# Patient Record
Sex: Female | Born: 1965 | Race: White | Hispanic: No | Marital: Married | Smoking: Never smoker
Health system: Southern US, Community
[De-identification: ages and names within clinical notes are randomized; demographics above are authoritative.]

## PROBLEM LIST (undated history)

## (undated) DIAGNOSIS — F419 Anxiety disorder, unspecified: Secondary | ICD-10-CM

## (undated) DIAGNOSIS — R45851 Suicidal ideations: Secondary | ICD-10-CM

## (undated) DIAGNOSIS — K8020 Calculus of gallbladder without cholecystitis without obstruction: Secondary | ICD-10-CM

## (undated) DIAGNOSIS — F32A Depression, unspecified: Secondary | ICD-10-CM

## (undated) DIAGNOSIS — K649 Unspecified hemorrhoids: Secondary | ICD-10-CM

## (undated) DIAGNOSIS — F329 Major depressive disorder, single episode, unspecified: Secondary | ICD-10-CM

## (undated) DIAGNOSIS — G4700 Insomnia, unspecified: Secondary | ICD-10-CM

## (undated) DIAGNOSIS — F429 Obsessive-compulsive disorder, unspecified: Secondary | ICD-10-CM

## (undated) DIAGNOSIS — K5909 Other constipation: Secondary | ICD-10-CM

## (undated) DIAGNOSIS — I10 Essential (primary) hypertension: Secondary | ICD-10-CM

## (undated) DIAGNOSIS — G43019 Migraine without aura, intractable, without status migrainosus: Secondary | ICD-10-CM

## (undated) DIAGNOSIS — R51 Headache: Secondary | ICD-10-CM

## (undated) DIAGNOSIS — G40909 Epilepsy, unspecified, not intractable, without status epilepticus: Secondary | ICD-10-CM

## (undated) DIAGNOSIS — M1990 Unspecified osteoarthritis, unspecified site: Secondary | ICD-10-CM

## (undated) HISTORY — DX: Insomnia, unspecified: G47.00

## (undated) HISTORY — PX: TONSILLECTOMY: SUR1361

## (undated) HISTORY — DX: Calculus of gallbladder without cholecystitis without obstruction: K80.20

## (undated) HISTORY — PX: CHOLECYSTECTOMY: SHX55

## (undated) HISTORY — DX: Epilepsy, unspecified, not intractable, without status epilepticus: G40.909

## (undated) HISTORY — DX: Depression, unspecified: F32.A

## (undated) HISTORY — DX: Other constipation: K59.09

## (undated) HISTORY — DX: Headache: R51

## (undated) HISTORY — DX: Suicidal ideations: R45.851

## (undated) HISTORY — DX: Unspecified osteoarthritis, unspecified site: M19.90

## (undated) HISTORY — DX: Migraine without aura, intractable, without status migrainosus: G43.019

## (undated) HISTORY — DX: Essential (primary) hypertension: I10

## (undated) HISTORY — DX: Unspecified hemorrhoids: K64.9

## (undated) HISTORY — DX: Obsessive-compulsive disorder, unspecified: F42.9

## (undated) HISTORY — PX: BREAST EXCISIONAL BIOPSY: SUR124

## (undated) HISTORY — DX: Anxiety disorder, unspecified: F41.9

## (undated) HISTORY — DX: Major depressive disorder, single episode, unspecified: F32.9

## (undated) HISTORY — PX: APPENDECTOMY: SHX54

---

## 1998-11-02 ENCOUNTER — Other Ambulatory Visit: Admission: RE | Admit: 1998-11-02 | Discharge: 1998-11-02 | Payer: Self-pay | Admitting: *Deleted

## 1998-12-21 ENCOUNTER — Ambulatory Visit (HOSPITAL_COMMUNITY): Admission: RE | Admit: 1998-12-21 | Discharge: 1998-12-21 | Payer: Self-pay | Admitting: *Deleted

## 1999-11-03 ENCOUNTER — Other Ambulatory Visit: Admission: RE | Admit: 1999-11-03 | Discharge: 1999-11-03 | Payer: Self-pay | Admitting: *Deleted

## 1999-12-06 HISTORY — PX: ABDOMINAL HYSTERECTOMY: SHX81

## 2000-03-31 ENCOUNTER — Encounter: Admission: RE | Admit: 2000-03-31 | Discharge: 2000-03-31 | Payer: Self-pay | Admitting: Urology

## 2000-03-31 ENCOUNTER — Encounter: Payer: Self-pay | Admitting: Urology

## 2000-06-02 ENCOUNTER — Encounter (INDEPENDENT_AMBULATORY_CARE_PROVIDER_SITE_OTHER): Payer: Self-pay

## 2000-06-02 ENCOUNTER — Inpatient Hospital Stay (HOSPITAL_COMMUNITY): Admission: RE | Admit: 2000-06-02 | Discharge: 2000-06-04 | Payer: Self-pay | Admitting: *Deleted

## 2002-05-13 ENCOUNTER — Other Ambulatory Visit: Admission: RE | Admit: 2002-05-13 | Discharge: 2002-05-13 | Payer: Self-pay | Admitting: *Deleted

## 2003-05-15 ENCOUNTER — Other Ambulatory Visit: Admission: RE | Admit: 2003-05-15 | Discharge: 2003-05-15 | Payer: Self-pay | Admitting: *Deleted

## 2004-01-06 ENCOUNTER — Encounter: Admission: RE | Admit: 2004-01-06 | Discharge: 2004-01-06 | Payer: Self-pay | Admitting: Family Medicine

## 2004-06-01 ENCOUNTER — Encounter: Admission: RE | Admit: 2004-06-01 | Discharge: 2004-06-01 | Payer: Self-pay | Admitting: Internal Medicine

## 2004-06-10 ENCOUNTER — Encounter: Admission: RE | Admit: 2004-06-10 | Discharge: 2004-06-10 | Payer: Self-pay | Admitting: Internal Medicine

## 2004-06-25 ENCOUNTER — Encounter: Admission: RE | Admit: 2004-06-25 | Discharge: 2004-06-25 | Payer: Self-pay | Admitting: Internal Medicine

## 2004-07-09 ENCOUNTER — Encounter: Admission: RE | Admit: 2004-07-09 | Discharge: 2004-07-09 | Payer: Self-pay | Admitting: Internal Medicine

## 2004-07-13 ENCOUNTER — Inpatient Hospital Stay (HOSPITAL_COMMUNITY): Admission: EM | Admit: 2004-07-13 | Discharge: 2004-07-15 | Payer: Self-pay | Admitting: *Deleted

## 2004-07-30 ENCOUNTER — Encounter: Admission: RE | Admit: 2004-07-30 | Discharge: 2004-07-30 | Payer: Self-pay | Admitting: Internal Medicine

## 2004-10-13 ENCOUNTER — Ambulatory Visit: Payer: Self-pay | Admitting: Internal Medicine

## 2004-11-03 ENCOUNTER — Ambulatory Visit: Payer: Self-pay | Admitting: Internal Medicine

## 2004-12-13 ENCOUNTER — Ambulatory Visit: Payer: Self-pay | Admitting: Internal Medicine

## 2005-02-09 ENCOUNTER — Ambulatory Visit: Payer: Self-pay | Admitting: Family Medicine

## 2005-05-14 ENCOUNTER — Ambulatory Visit: Payer: Self-pay | Admitting: Family Medicine

## 2005-06-29 ENCOUNTER — Ambulatory Visit: Payer: Self-pay | Admitting: Family Medicine

## 2006-08-23 ENCOUNTER — Ambulatory Visit: Payer: Self-pay | Admitting: Family Medicine

## 2006-08-25 ENCOUNTER — Ambulatory Visit: Payer: Self-pay | Admitting: Family Medicine

## 2006-09-06 ENCOUNTER — Ambulatory Visit: Payer: Self-pay | Admitting: Family Medicine

## 2006-11-01 ENCOUNTER — Encounter: Admission: RE | Admit: 2006-11-01 | Discharge: 2006-11-01 | Payer: Self-pay | Admitting: Family Medicine

## 2006-11-20 ENCOUNTER — Encounter: Admission: RE | Admit: 2006-11-20 | Discharge: 2006-11-20 | Payer: Self-pay | Admitting: Family Medicine

## 2007-01-05 HISTORY — PX: BREAST LUMPECTOMY: SHX2

## 2007-01-09 ENCOUNTER — Encounter: Admission: RE | Admit: 2007-01-09 | Discharge: 2007-01-09 | Payer: Self-pay | Admitting: General Surgery

## 2007-01-09 ENCOUNTER — Ambulatory Visit (HOSPITAL_COMMUNITY): Admission: RE | Admit: 2007-01-09 | Discharge: 2007-01-09 | Payer: Self-pay | Admitting: General Surgery

## 2007-01-09 ENCOUNTER — Encounter (INDEPENDENT_AMBULATORY_CARE_PROVIDER_SITE_OTHER): Payer: Self-pay | Admitting: Specialist

## 2007-07-05 ENCOUNTER — Ambulatory Visit: Payer: Self-pay | Admitting: Certified Registered Nurse Anesthetist

## 2007-07-05 DIAGNOSIS — J029 Acute pharyngitis, unspecified: Secondary | ICD-10-CM | POA: Insufficient documentation

## 2007-07-05 DIAGNOSIS — R569 Unspecified convulsions: Secondary | ICD-10-CM | POA: Insufficient documentation

## 2007-07-05 DIAGNOSIS — R599 Enlarged lymph nodes, unspecified: Secondary | ICD-10-CM | POA: Insufficient documentation

## 2007-07-30 ENCOUNTER — Telehealth: Payer: Self-pay | Admitting: Family Medicine

## 2007-09-18 ENCOUNTER — Ambulatory Visit: Payer: Self-pay | Admitting: Family Medicine

## 2007-09-18 DIAGNOSIS — R634 Abnormal weight loss: Secondary | ICD-10-CM | POA: Insufficient documentation

## 2007-09-18 LAB — CONVERTED CEMR LAB
Bilirubin Urine: NEGATIVE
Blood in Urine, dipstick: NEGATIVE
Urobilinogen, UA: 1
WBC Urine, dipstick: NEGATIVE
pH: 6.5

## 2007-09-20 ENCOUNTER — Telehealth: Payer: Self-pay | Admitting: Family Medicine

## 2007-09-24 LAB — CONVERTED CEMR LAB
ALT: 11 units/L (ref 0–35)
Alkaline Phosphatase: 61 units/L (ref 39–117)
Basophils Relative: 0.8 % (ref 0.0–1.0)
Bilirubin, Direct: 0.2 mg/dL (ref 0.0–0.3)
CO2: 25 meq/L (ref 19–32)
Eosinophils Absolute: 0.1 10*3/uL (ref 0.0–0.6)
Eosinophils Relative: 2 % (ref 0.0–5.0)
GFR calc Af Amer: 64 mL/min
GFR calc non Af Amer: 53 mL/min
Glucose, Bld: 82 mg/dL (ref 70–99)
LDL Cholesterol: 96 mg/dL (ref 0–99)
Lymphocytes Relative: 32.1 % (ref 12.0–46.0)
Monocytes Absolute: 0.4 10*3/uL (ref 0.2–0.7)
Monocytes Relative: 6.7 % (ref 3.0–11.0)
Neutro Abs: 3.1 10*3/uL (ref 1.4–7.7)
Neutrophils Relative %: 58.4 % (ref 43.0–77.0)
Platelets: 302 10*3/uL (ref 150–400)
RBC: 3.96 M/uL (ref 3.87–5.11)
T3, Free: 2.6 pg/mL (ref 2.3–4.2)
Total Bilirubin: 0.4 mg/dL (ref 0.3–1.2)
VLDL: 21 mg/dL (ref 0–40)

## 2007-10-24 ENCOUNTER — Telehealth: Payer: Self-pay | Admitting: Family Medicine

## 2007-10-26 ENCOUNTER — Ambulatory Visit: Payer: Self-pay | Admitting: Family Medicine

## 2007-10-26 DIAGNOSIS — F5104 Psychophysiologic insomnia: Secondary | ICD-10-CM | POA: Insufficient documentation

## 2007-11-05 ENCOUNTER — Encounter: Admission: RE | Admit: 2007-11-05 | Discharge: 2007-11-05 | Payer: Self-pay | Admitting: Family Medicine

## 2007-11-08 ENCOUNTER — Encounter: Admission: RE | Admit: 2007-11-08 | Discharge: 2007-11-08 | Payer: Self-pay | Admitting: Family Medicine

## 2008-03-20 ENCOUNTER — Telehealth: Payer: Self-pay | Admitting: Family Medicine

## 2008-04-16 ENCOUNTER — Telehealth: Payer: Self-pay | Admitting: Family Medicine

## 2008-04-21 ENCOUNTER — Telehealth: Payer: Self-pay | Admitting: Family Medicine

## 2008-05-12 ENCOUNTER — Encounter: Admission: RE | Admit: 2008-05-12 | Discharge: 2008-05-12 | Payer: Self-pay | Admitting: Family Medicine

## 2008-09-30 ENCOUNTER — Telehealth: Payer: Self-pay | Admitting: Family Medicine

## 2008-10-01 ENCOUNTER — Ambulatory Visit: Payer: Self-pay | Admitting: Family Medicine

## 2008-10-01 DIAGNOSIS — F411 Generalized anxiety disorder: Secondary | ICD-10-CM | POA: Insufficient documentation

## 2008-10-06 ENCOUNTER — Telehealth: Payer: Self-pay | Admitting: Family Medicine

## 2008-10-07 ENCOUNTER — Ambulatory Visit: Payer: Self-pay | Admitting: Psychiatry

## 2008-10-07 ENCOUNTER — Emergency Department (HOSPITAL_COMMUNITY): Admission: EM | Admit: 2008-10-07 | Discharge: 2008-10-07 | Payer: Self-pay | Admitting: Emergency Medicine

## 2008-10-07 ENCOUNTER — Inpatient Hospital Stay (HOSPITAL_COMMUNITY): Admission: AD | Admit: 2008-10-07 | Discharge: 2008-10-09 | Payer: Self-pay | Admitting: Psychiatry

## 2008-10-07 ENCOUNTER — Telehealth: Payer: Self-pay | Admitting: Family Medicine

## 2008-10-08 ENCOUNTER — Emergency Department (HOSPITAL_COMMUNITY): Admission: EM | Admit: 2008-10-08 | Discharge: 2008-10-08 | Payer: Self-pay | Admitting: Emergency Medicine

## 2008-10-14 ENCOUNTER — Ambulatory Visit: Payer: Self-pay | Admitting: Family Medicine

## 2008-10-14 DIAGNOSIS — T148XXA Other injury of unspecified body region, initial encounter: Secondary | ICD-10-CM | POA: Insufficient documentation

## 2008-10-14 DIAGNOSIS — F32A Depression, unspecified: Secondary | ICD-10-CM | POA: Insufficient documentation

## 2008-10-14 DIAGNOSIS — F329 Major depressive disorder, single episode, unspecified: Secondary | ICD-10-CM | POA: Insufficient documentation

## 2008-10-17 ENCOUNTER — Ambulatory Visit: Payer: Self-pay | Admitting: *Deleted

## 2008-10-22 ENCOUNTER — Ambulatory Visit: Payer: Self-pay | Admitting: *Deleted

## 2008-11-05 ENCOUNTER — Encounter: Admission: RE | Admit: 2008-11-05 | Discharge: 2008-11-05 | Payer: Self-pay | Admitting: Family Medicine

## 2009-03-16 ENCOUNTER — Emergency Department (HOSPITAL_COMMUNITY): Admission: EM | Admit: 2009-03-16 | Discharge: 2009-03-17 | Payer: Self-pay | Admitting: Emergency Medicine

## 2009-03-17 ENCOUNTER — Inpatient Hospital Stay (HOSPITAL_COMMUNITY): Admission: RE | Admit: 2009-03-17 | Discharge: 2009-03-20 | Payer: Self-pay | Admitting: Psychiatry

## 2009-03-18 ENCOUNTER — Ambulatory Visit: Payer: Self-pay | Admitting: Psychiatry

## 2009-03-20 ENCOUNTER — Telehealth: Payer: Self-pay | Admitting: Family Medicine

## 2009-05-08 ENCOUNTER — Telehealth: Payer: Self-pay | Admitting: Family Medicine

## 2009-05-11 ENCOUNTER — Ambulatory Visit: Payer: Self-pay | Admitting: Psychology

## 2009-07-02 ENCOUNTER — Ambulatory Visit: Payer: Self-pay | Admitting: Psychology

## 2009-07-09 ENCOUNTER — Ambulatory Visit: Payer: Self-pay | Admitting: Psychology

## 2009-08-03 ENCOUNTER — Emergency Department (HOSPITAL_COMMUNITY): Admission: EM | Admit: 2009-08-03 | Discharge: 2009-08-04 | Payer: Self-pay | Admitting: Emergency Medicine

## 2009-08-04 ENCOUNTER — Ambulatory Visit: Payer: Self-pay | Admitting: Psychiatry

## 2009-08-04 ENCOUNTER — Inpatient Hospital Stay (HOSPITAL_COMMUNITY): Admission: AD | Admit: 2009-08-04 | Discharge: 2009-08-10 | Payer: Self-pay | Admitting: Psychiatry

## 2009-08-12 ENCOUNTER — Other Ambulatory Visit (HOSPITAL_COMMUNITY): Admission: RE | Admit: 2009-08-12 | Discharge: 2009-11-10 | Payer: Self-pay | Admitting: Psychiatry

## 2009-08-13 ENCOUNTER — Ambulatory Visit: Payer: Self-pay | Admitting: Psychiatry

## 2009-08-28 ENCOUNTER — Ambulatory Visit: Payer: Self-pay | Admitting: Family Medicine

## 2009-10-06 ENCOUNTER — Encounter (INDEPENDENT_AMBULATORY_CARE_PROVIDER_SITE_OTHER): Payer: Self-pay | Admitting: *Deleted

## 2009-11-06 ENCOUNTER — Ambulatory Visit: Payer: Self-pay | Admitting: Family Medicine

## 2009-11-09 ENCOUNTER — Encounter: Admission: RE | Admit: 2009-11-09 | Discharge: 2009-11-09 | Payer: Self-pay | Admitting: Family Medicine

## 2009-11-18 ENCOUNTER — Ambulatory Visit: Payer: Self-pay | Admitting: Family Medicine

## 2009-12-11 ENCOUNTER — Telehealth: Payer: Self-pay | Admitting: Family Medicine

## 2010-01-26 ENCOUNTER — Telehealth: Payer: Self-pay | Admitting: Family Medicine

## 2010-03-15 ENCOUNTER — Encounter: Payer: Self-pay | Admitting: Family Medicine

## 2010-05-06 ENCOUNTER — Telehealth: Payer: Self-pay | Admitting: Family Medicine

## 2010-08-17 ENCOUNTER — Telehealth: Payer: Self-pay | Admitting: Family Medicine

## 2010-10-04 ENCOUNTER — Encounter: Payer: Self-pay | Admitting: Family Medicine

## 2010-11-10 ENCOUNTER — Encounter
Admission: RE | Admit: 2010-11-10 | Discharge: 2010-11-10 | Payer: Self-pay | Source: Home / Self Care | Admitting: Family Medicine

## 2011-01-04 NOTE — Letter (Signed)
Summary: [name]  [name]   Imported By: Maryln Gottron 03/22/2010 09:48:57  _____________________________________________________________________  External Attachment:    Type:   Image     Comment:   External Document

## 2011-01-04 NOTE — Progress Notes (Signed)
Summary: clarify dose  Phone Note From Pharmacy   [facility]  [address] [id number] Call For: fry  Summary of Call: pt said she is taking Trazodone 100mg  2 once daily rx was sent for 1 once daily please clarify Initial call taken by: Alfred Levins, CMA,  December 11, 2009 9:51 AM  Follow-up for Phone Call        I agree, she takes 2 of these a day. Call in #60 with 5 rf Follow-up by: Nelwyn Salisbury MD,  December 11, 2009 3:53 PM  Additional Follow-up for Phone Call Additional follow up Details #1::        Phone call completed, Pharmacist called Additional Follow-up by: Alfred Levins, CMA,  December 11, 2009 3:56 PM    New/Updated Medications: TRAZODONE HCL 100 MG TABS (TRAZODONE HCL) 2 by mouth once daily Prescriptions: TRAZODONE HCL 100 MG TABS (TRAZODONE HCL) 2 by mouth once daily  #60 x 5   Entered by:   Alfred Levins, CMA   Authorized by:   Nelwyn Salisbury MD   Signed by:   Alfred Levins, CMA on 12/11/2009   Method used:   [facility]        [facility]  [address] [phone][facility] (retail)       [address]       [place], [place]  [address]       Ph: [phone]       Fax: [phone]   RxID:   [id number]

## 2011-01-04 NOTE — Letter (Signed)
Summary: [name]  [name]   Imported By: Maryln Gottron 10/08/2010 14:41:18  _____________________________________________________________________  External Attachment:    Type:   Image     Comment:   External Document

## 2011-01-04 NOTE — Progress Notes (Signed)
Summary: rx alprazolam   Phone Note From Pharmacy   [facility] [address] Summary of Call: refill alprazolam  Initial call taken by: Pura Spice, RN,  August 17, 2010 3:33 PM  Follow-up for Phone Call        call in #90 with 5 rf Follow-up by: Nelwyn Salisbury MD,  August 18, 2010 11:11 AM  Additional Follow-up for Phone Call Additional follow up Details #1::        done Additional Follow-up by: Pura Spice, RN,  August 18, 2010 11:25 AM    Prescriptions: Prudy Feeler 2 MG TABS (ALPRAZOLAM) 1 by mouth three times a day as needed for anxiety  #90 x 5   Entered by:   Pura Spice, RN   Authorized by:   Nelwyn Salisbury MD   Signed by:   Pura Spice, RN on 08/18/2010   Method used:   Telephoned to ...       [facility]  [address] [id number] ([facility]       [address]       [place], [place]  [address]       Ph: [phone]       Fax: [phone]   RxID:   [id number]

## 2011-01-04 NOTE — Progress Notes (Signed)
Summary: requesting alternate rx  Phone Note Call from Patient Call back at Home Phone [phone]   Caller: Patient--live call Reason for Call: Talk to Doctor Summary of Call: On Cenestin 0.625 mg since 2001. Would like to switch to a generic of an estrogen based rx. Copay on Cenestin is too expensive. [facility] [address].  Initial call taken by: Warnell Forester,  May 06, 2010 11:03 AM  Follow-up for Phone Call        switch to Premarin 0.625mg  once daily . call in a one year supply Follow-up by: Nelwyn Salisbury MD,  May 06, 2010 12:25 PM  Additional Follow-up for Phone Call Additional follow up Details #1::        Rx Called In Additional Follow-up by: Raechel Ache, RN,  May 06, 2010 12:27 PM    New/Updated Medications: PREMARIN 0.625 MG TABS (ESTROGENS CONJUGATED) 1 once daily Prescriptions: PREMARIN 0.625 MG TABS (ESTROGENS CONJUGATED) 1 once daily  #30 x 11   Entered by:   Raechel Ache, RN   Authorized by:   Nelwyn Salisbury MD   Signed by:   Raechel Ache, RN on 05/06/2010   Method used:   [facility]        [facility]  [address] [phone][facility] (retail)       [address]       [place], [place]  [address]       Ph: [phone]       Fax: [phone]   RxID:   [id number]

## 2011-01-04 NOTE — Progress Notes (Signed)
Summary: refill xanax  Phone Note From Pharmacy   [facility]  [address] [id number] Call For: [name]  Summary of Call: refill xanax 2mg  1 by mouth three times a day as needed for anxiety Initial call taken by: Alfred Levins, CMA,  January 26, 2010 1:30 PM  Follow-up for Phone Call        call in #90 with 5 rf Follow-up by: Nelwyn Salisbury MD,  January 26, 2010 2:34 PM  Additional Follow-up for Phone Call Additional follow up Details #1::        Phone call completed, Pharmacist called Additional Follow-up by: Alfred Levins, CMA,  January 26, 2010 3:17 PM    New/Updated Medications: XANAX 2 MG TABS (ALPRAZOLAM) 1 by mouth three times a day as needed for anxiety Prescriptions: XANAX 2 MG TABS (ALPRAZOLAM) 1 by mouth three times a day as needed for anxiety  #90 x 5   Entered by:   Alfred Levins, CMA   Authorized by:   Nelwyn Salisbury MD   Signed by:   Alfred Levins, CMA on 01/26/2010   Method used:   Telephoned to ...       [facility]  [address] [id number]       [address]       [place], [place]  [address]       Ph: [phone]       Fax: [phone]   RxID:   [id number]

## 2011-01-07 ENCOUNTER — Telehealth: Payer: Self-pay | Admitting: *Deleted

## 2011-01-07 ENCOUNTER — Telehealth: Payer: Self-pay | Admitting: Family Medicine

## 2011-01-07 NOTE — Telephone Encounter (Signed)
Needs an OV to discuss this

## 2011-01-07 NOTE — Telephone Encounter (Signed)
pls sch  

## 2011-01-07 NOTE — Telephone Encounter (Signed)
Pt wants to be changed from Xanax to Valium.  Xanax is not working as well.

## 2011-01-10 ENCOUNTER — Encounter: Payer: Self-pay | Admitting: Family Medicine

## 2011-01-10 ENCOUNTER — Ambulatory Visit (INDEPENDENT_AMBULATORY_CARE_PROVIDER_SITE_OTHER): Payer: Managed Care, Other (non HMO) | Admitting: Family Medicine

## 2011-01-10 VITALS — BP 100/60 | HR 104 | Temp 98.5°F | Wt 98.0 lb

## 2011-01-10 DIAGNOSIS — F419 Anxiety disorder, unspecified: Secondary | ICD-10-CM

## 2011-01-10 DIAGNOSIS — F411 Generalized anxiety disorder: Secondary | ICD-10-CM

## 2011-01-10 MED ORDER — DIAZEPAM 5 MG PO TABS: 5.0000 mg | ORAL_TABLET | Freq: Three times a day (TID) | ORAL | Status: AC | PRN

## 2011-01-10 NOTE — Telephone Encounter (Signed)
Pt has ov sch for today 01-10-2011 1pm

## 2011-01-10 NOTE — Progress Notes (Signed)
  Subjective:    Patient ID: [name], female    DOB: 20-Dec-1965, 45 y.o.   MRN: [id number]  HPI Here to discuss several issues. First she has chronic neck pain, and asks if we should get another scan of it. Dr. Anne Hahn sent her for an MRI sometime in the past few years, and nothing specific was seen. She cannot remember exactly when this was. Her neck pain has gotten worse over the past few years. Also she wants to try Valium instead of Xanax for her anxiety because Xanax does not seem to work anymore. She has been on  this for several years. Her depression has actually been fairly well controlled of late.    Review of Systems  Constitutional: Negative.   Psychiatric/Behavioral: Negative for suicidal ideas, hallucinations, behavioral problems, confusion, sleep disturbance, self-injury, dysphoric mood, decreased concentration and agitation. The patient is nervous/anxious. The patient is not hyperactive.        Objective:   Physical Exam  Constitutional: She appears well-nourished.  Neck: Normal range of motion. Neck supple.  Psychiatric: She has a normal mood and affect. Her behavior is normal. Judgment and thought content normal.          Assessment & Plan:  Try Valium for the anxiety. She will contact Dr. Anne Hahn' office about her last neck scan and will check with her insurance company about possible costs involved with getting another one.

## 2011-03-08 ENCOUNTER — Other Ambulatory Visit: Payer: Self-pay

## 2011-03-09 MED ORDER — ALPRAZOLAM 2 MG PO TABS: 2.0000 mg | ORAL_TABLET | Freq: Three times a day (TID) | ORAL | Status: AC | PRN

## 2011-03-09 NOTE — Telephone Encounter (Signed)
Call in #90 with 5 rf 

## 2011-03-09 NOTE — Telephone Encounter (Signed)
rx fro alprazolam called and faxed in for alprazolam

## 2011-03-11 LAB — URINE DRUGS OF ABUSE SCREEN W ALC, ROUTINE (REF LAB)
Benzodiazepines.: POSITIVE — AB
Opiate Screen, Urine: NEGATIVE

## 2011-03-11 LAB — BENZODIAZEPINE, QUANTITATIVE, URINE
Alprazolam (GC/LC/MS), ur confirm: NEGATIVE
Oxazepam GC/MS Conf: 3500 ng/mL

## 2011-03-12 LAB — HEPATIC FUNCTION PANEL
Alkaline Phosphatase: 67 U/L (ref 39–117)
Indirect Bilirubin: 0.2 mg/dL — ABNORMAL LOW (ref 0.3–0.9)
Total Bilirubin: 0.3 mg/dL (ref 0.3–1.2)
Total Protein: 7.1 g/dL (ref 6.0–8.3)

## 2011-03-12 LAB — TSH: TSH: 0.508 u[IU]/mL (ref 0.350–4.500)

## 2011-03-12 LAB — COMPREHENSIVE METABOLIC PANEL
ALT: 17 U/L (ref 0–35)
Albumin: 3.6 g/dL (ref 3.5–5.2)
BUN: 18 mg/dL (ref 6–23)
Calcium: 9.1 mg/dL (ref 8.4–10.5)
Chloride: 109 mEq/L (ref 96–112)
GFR calc Af Amer: 56 mL/min — ABNORMAL LOW (ref >60)
GFR calc non Af Amer: 46 mL/min — ABNORMAL LOW (ref >60)
Sodium: 138 mEq/L (ref 135–145)
Total Bilirubin: 0.4 mg/dL (ref 0.3–1.2)
Total Protein: 7.3 g/dL (ref 6.0–8.3)

## 2011-03-12 LAB — BASIC METABOLIC PANEL
BUN: 14 mg/dL (ref 6–23)
CO2: 26 mEq/L (ref 19–32)
Chloride: 108 mEq/L (ref 96–112)
Glucose, Bld: 87 mg/dL (ref 70–99)
Potassium: 4.1 mEq/L (ref 3.5–5.1)

## 2011-03-12 LAB — DIFFERENTIAL
Eosinophils Absolute: 0 10*3/uL (ref 0.0–0.7)
Eosinophils Relative: 1 % (ref 0–5)
Lymphs Abs: 1.4 10*3/uL (ref 0.7–4.0)
Monocytes Relative: 5 % (ref 3–12)

## 2011-03-12 LAB — URINALYSIS, ROUTINE W REFLEX MICROSCOPIC
Bilirubin Urine: NEGATIVE
Hgb urine dipstick: NEGATIVE
Ketones, ur: NEGATIVE mg/dL
Protein, ur: NEGATIVE mg/dL
Urobilinogen, UA: 1 mg/dL (ref 0.0–1.0)

## 2011-03-12 LAB — RAPID URINE DRUG SCREEN, HOSP PERFORMED
Amphetamines: NOT DETECTED
Barbiturates: NOT DETECTED
Benzodiazepines: POSITIVE — AB
Cocaine: NOT DETECTED
Tetrahydrocannabinol: NOT DETECTED

## 2011-03-12 LAB — CBC
HCT: 37.4 % (ref 36.0–46.0)
MCV: 97 fL (ref 78.0–100.0)
Platelets: 253 10*3/uL (ref 150–400)
WBC: 6.6 10*3/uL (ref 4.0–10.5)

## 2011-03-12 LAB — SALICYLATE LEVEL: Salicylate Lvl: <4 mg/dL (ref 2.8–20.0)

## 2011-03-12 LAB — ETHANOL: Alcohol, Ethyl (B): <5 mg/dL (ref 0–10)

## 2011-03-16 LAB — POCT I-STAT, CHEM 8
BUN: 15 mg/dL (ref 6–23)
Chloride: 108 mEq/L (ref 96–112)
HCT: 40 % (ref 36.0–46.0)
Sodium: 140 mEq/L (ref 135–145)
TCO2: 23 mmol/L (ref 0–100)

## 2011-03-16 LAB — CBC
Platelets: 293 10*3/uL (ref 150–400)
WBC: 9 10*3/uL (ref 4.0–10.5)

## 2011-03-16 LAB — DIFFERENTIAL
Lymphocytes Relative: 13 % (ref 12–46)
Lymphs Abs: 1.2 10*3/uL (ref 0.7–4.0)
Neutrophils Relative %: 82 % — ABNORMAL HIGH (ref 43–77)

## 2011-03-16 LAB — RAPID URINE DRUG SCREEN, HOSP PERFORMED
Amphetamines: NOT DETECTED
Tetrahydrocannabinol: NOT DETECTED

## 2011-03-16 LAB — ETHANOL: Alcohol, Ethyl (B): <5 mg/dL (ref 0–10)

## 2011-03-16 LAB — ACETAMINOPHEN LEVEL: Acetaminophen (Tylenol), Serum: <10 ug/mL — ABNORMAL LOW (ref 10–30)

## 2011-03-16 LAB — SALICYLATE LEVEL: Salicylate Lvl: <4 mg/dL (ref 2.8–20.0)

## 2011-04-19 NOTE — H&P (Signed)
NAMEBREONNA, [name]             ACCOUNT NO.:  [id number]   MEDICAL RECORD NO.:  [id number]          PATIENT TYPE:  IPS   LOCATION:  0407                          [facility]   PHYSICIAN:  Anselm Jungling, MD  DATE OF BIRTH:  24-Mar-1966   DATE OF ADMISSION:  03/18/2009  DATE OF DISCHARGE:                       PSYCHIATRIC ADMISSION ASSESSMENT   TIME OF ASSESSMENT:  March 18, 2009 at 0950.   IDENTIFYING INFORMATION:  This is a 45 year old female.  This is a  voluntary admission.   HISTORY OF THE PRESENT ILLNESS:  Second [facility] admission for this mother of  2 who presented in the emergency room having made multiple superficial  cuts all over her legs, the full length of her legs, full length of her  arms, also in chest and abdomen and basically just about any surface she  could reach, other than her neck and face.  Said she got agitated and  panicky after having an argument with her mother this week.  Says that  the stress just built up inside of her and she felt she could not  contain it.  She endorses a lot of chronic anxiety with 1 month of poor  appetite, having to force herself to eat, not sure if she has lost any  weight.  Sleep has been decreased to somewhere between 3 and 4 hours per  night due to constant thinking and worrying.  Believes she may have lost  about 10 pounds and admits to having difficulty functioning, difficulty  relaxing.  She reports she has 2 main stressors.  About 1 month ago, her  son who was previously diagnosed with mood problems, broke up with his  wife because he was seeing another woman.  The daughter-in-law had come  to her and was crying and concerned about the future of their marriage.  She is worried that her son, who is also abusing substances, will injure  himself.  Then secondly, she has been planning a 50th wedding  anniversary party for her parents scheduled for this Saturday and her  mother mentioned to her that she did not see how she  could plan a party  since she was using drugs.  The patient was devastated by this, has no  history of substance abuse and did not understand where her mother had  gotten that from.  She said this was the last straw.  She admits that  she has had some vague suicidal thoughts for about 2 weeks and recently  asked a friend to buy a gun for her for protection.  Her husband found  out about it and stopped it.  Husband works 2 shifts late into the  evening and patient has problems with a lot of tension at night and  anxiety with the husband not home.  Denies any homicidal thoughts or  hallucinations.   PAST PSYCHIATRIC HISTORY:  No current outpatient treatment.  She did not  continue counseling, for which she was previously referred last fall.  She has a history of a previous admission in August 2005 to [facility] for a significant drug overdose and  she cites a history of  multiple suicide attempts.  This is her second [facility] admission with her  previous 1 being in November 2009.  At that time she had cut her wrist.  She reports a history of previous trials of Zoloft, Paxil, Risperdal,  Effexor, Cymbalta, Valium, trazodone.  Reports almost all  antidepressants have caused her to have an increase in suicidal thoughts  so they frighten her.  Was recently prescribed Lamictal as an adjunct to  her Zonegran by Dr. Lesia Sago because of having 2 seizures within a 6-  month period.  She delayed in starting the Lamictal at 25 mg daily and,  2 weeks after picking the prescription up, thought she should go ahead  and start at about 100 mg a day in order to catch up where she thought  she should be.  It then made her drowsy, so she stopped taking it last  week.  She denies any history of substance abuse.   SOCIAL HISTORY:  Married female, has a 88 year old son who is in a  Financial planner, doing well.  Husband is working at 2 jobs to make ends  meet.  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  Experiencing a lot of stress with her 47 year old  son with his marital difficulties and she has a 35-month-old grandchild.  No legal problems.  Husband is supportive.   FAMILY HISTORY:  Denies a family history of mental illness or substance  abuse.   CURRENT MEDICATIONS:  1. Alprazolam 2 mg 1/2 to 1 tab t.i.d. p.r.n., she takes only      occasionally and not even daily, as needed for panic or anxiety,      prescribed by Dr. Clent Ridges.  2. Lamictal on a gradually increasing dose.  She stopped it last week.  3. Cenestin hormone replacement therapy, takes 0.625 daily.  4. Hydrocodone 5/500, takes 1 to 2 p.r.n. for headaches, prescribed by      Dr. Clent Ridges.  5. Ambien 10 mg, she takes 2 p.o. q.h.s.  6. Zonegran 100 mg, 2 in the morning and 3 in the evening for a total      of 500 mg daily.   ALLERGIES:  No known drug allergies.   POSITIVE PHYSICAL FINDINGS:  A PE was done in the emergency room.  It is  remarkable for multiple, multiple superficial cuts.  Her tetanus was up-  to-date.  No lacerations required suturing.  Urine drug screen was  positive for opiates and benzodiazepines.  Chemistry remarkable for BUN  15, creatinine 1.4.  CBC:  WBC 9.0, hemoglobin 12.8, hematocrit 37.5 and  platelets 293,000 with MCV of 98.4.  Salicylate and acetaminophen levels  were negative.   MEDICAL PROBLEMS:  Multiple superficial lacerations and epilepsy.   MENTAL STATUS EXAM:  Alert female.  Arms are wrapped in gauze.  She is  wearing a hospital gown with slacks.  Fully alert and oriented x4 with  an anxious affect, a rather rigid posture, very thin, almost cachectic.  Speech normal in pace, tone and production.  A little bit guarded with  giving her history, but she is polite, coherent.  Does relax and  discloses more as the interview goes on.  Thinking is logical, coherent,  goal directed.  No distortions of thought, no delusional statements, no  evidence of  psychosis.  Mood is anxious.  Insight is adequate.  Is able  to describe symptoms of anxiety, frequently organizing a  lot of things,  a lot of tension, rapid heart rate from time to time, feeling  subjectively anxious that she cannot rest.  No homicidal thoughts.  Insight is adequate.  She admits, finally, that she did ask for a gun  for protection and recognizes, as she tells me that, that she had  suicidal thoughts in her mind.   AXIS I:  Depressive disorder, not otherwise specified, rule out panic  disorder without agoraphobia.  AXIS II:  Deferred.  AXIS III:  Multiple superficial lacerations and epilepsy.  AXIS IV:  Severe family and relationship stressors, some financial  stressors.  AXIS V:  Current 40, past year 9.   PLAN:  Voluntarily admit her to stabilize her mood and evaluate her  suicidality and the level of her anxiety.  We are going to resume her  Zonegran.  We have asked her to consider going back on the proper dose  of Lamictal and if she declines,  we will contact her neurologist, Dr.  Anne Hahn, and arrange outpatient followup.  We are going to check a TSH  and a free T4 and will continue her prescribed Xanax and Ambien.      Margaret A. Lorin Picket, N.P.      Anselm Jungling, MD  Electronically Signed    MAS/MEDQ  D:  03/18/2009  T:  03/18/2009  Job:  [id number]

## 2011-04-19 NOTE — H&P (Signed)
[name], [name]             ACCOUNT NO.:  [id number]   MEDICAL RECORD NO.:  [id number]          PATIENT TYPE:  IPS   LOCATION:  0301                          [facility]ILITY[facility]  [facility]   PHYSICIAN:  Shannon Jungling, MD  DATE OF BIRTH:  18-Jun-1966   DATE OF ADMISSION:  08/04/2009  DATE OF DISCHARGE:                       PSYCHIATRIC ADMISSION ASSESSMENT   TIME:  0900.   IDENTIFYING INFORMATION:  A 45 year old female, married.  This is a  voluntary admission.   HISTORY OF PRESENT ILLNESS:  Third or fourth [facility] admission for this 89-  year-old mother of 2 who presented in the emergency room after she had  been stopped by the sheriff for driving erratically.  She is prescribed  alprazolam 2 mg up to 3 times a day.  Says that she had not needed to  take them for the last couple of weeks, but over the weekend while her  family was on a camping trip, she became very lonely and got upset.  Mood went downhill and she took several handfuls of them, taking about 7-  8 alprazolam at a time.  Then took some Ambien sleeping pills.  Says  that she intended to kill herself, but fell asleep and woke up fine the  next day.  Was stopped by the sheriff on the way to take her cat to the  veterinarian.  She had contacted her therapist on the prior day while  she was taking her alprazolam, but was unable to reach him.  Endorses  feeling sad and lonely.  No homicidal thoughts.  Says that she is  chronically lonely and sad because her husband works so many jobs.  Feels somewhat socially isolated and having additional stress with  dealing with one of her adult children.  She denies other substance  abuse.  Denies a history of alcohol use.  She is requesting to get off  the alprazolam and would also like to stop taking the Lortab that she  takes for occasional headaches.  She is requesting to be referred to a  long-term treatment program to stay off substances.   PAST PSYCHIATRIC HISTORY:  Currently followed as  an outpatient by [name], her psychotherapist and her primary care physician.  This is  one of several [facility] admissions.  Last here in April 2010 also for  depression.  She has been treated in the past with Cymbalta.  Has also  taken Valium, Ambien and alprazolam.  Also has taken Risperdal in the  past.   SOCIAL HISTORY:  Married, husband works 2 jobs and keeps long hours.  The patient has worked in the past as a Comptroller with an Alzheimer's  patient.  Stressed by 40 year old son who has marital difficulties and  there is a 39-month-old grandchild.  No legal problems.  Husband is  supportive.   FAMILY HISTORY:  She denies a family history of mental illness or  substance abuse.   PRIMARY CARE PHYSICIAN:  Shannon Senior A. Clent Ridges, MD.   Shannon JamesMarlan Palau, MD.   MEDICAL PROBLEMS:  Seizure disorder and headache, NOS.   CURRENT MEDICATIONS:  1. Zonegran  200 mg q.a.m. and 300 mg q.h.s.  2. Alprazolam 2 mg up to three times a day as needed.  3. Ambien 10 mg two at bedtime.  4. Hydrocodone 5/500 one-two p.r.n. for headaches.  5. Cenestin 0.625 mg daily.   DRUG ALLERGIES:  NONE.   PHYSICAL EXAMINATION:  GENERAL:  Physical exam was done in the emergency  room as noted in the record.  Healthy female, slim, who endorses weight  loss due to decreased appetite due to her seizure medications.  VITAL SIGNS:  Afebrile, pulse 81, blood pressure 118/81, 44.4 kg, 5 feet  1 inch tall.   LABORATORY DATA:  Diagnostic studies were done in the emergency room.  CBC unremarkable.  Chemistry:  Sodium 139, potassium 4.1, chloride 108,  carbon dioxide 26, BUN 14, creatinine 1.27.  Liver enzymes normal.  Urine drug screen positive for benzodiazepines and opiates.  Alcohol  level less than 5.  Urine is cloudy with specific gravity 1.025 and  otherwise normal.   MENTAL STATUS EXAM:  Fully alert female, oriented x4.  No signs of  withdrawal.  In full contact with reality.  Good eye contact.  Speech  is  nonpressured.  Gives a logical history.  Mood is neutral today.  She  does admit that she has had an episode of depression.  Affect is  nontearful and appropriate.  No evidence of psychosis.  Insight good.  Memory intact.  Cognition completely intact.  She voices no active  suicidal thoughts today.   AXIS I:  Major depression, not otherwise specified, benzodiazepine  dependence.  AXIS II:  No diagnosis.  AXIS III:  Seizure disorder, headache not otherwise specified.  AXIS IV:  Moderate-severe chronic parenting issues.  AXIS V:  Current 46, past year not known.   PLAN:  The plan is to voluntarily admit her.  She has requested detox  from the alprazolam after receiving a charge for driving while impaired.  We are going to detox her off that.  We will discontinue her Ambien.  She has also requested that we discontinue her Lortab.  We have started  her on a Librium protocol.  We will contact her neurologist to see if he  recommends any alterations in her Zonegran or other seizure medications  to help cover her during the withdrawal processes.      [name], N.P.      Shannon Jungling, MD  Electronically Signed    MAS/MEDQ  D:  08/05/2009  T:  08/05/2009  Job:  [id number]

## 2011-04-22 NOTE — Op Note (Signed)
[facility]  Patient:    [name], [name]                    MRN: [id number] Proc. Date: 06/02/00 Adm. Date:  [id number] Attending:  Pleas Koch                           Operative Report  PREOPERATIVE DIAGNOSES:       1. Chronic pelvic pain.                               2. Endometriosis failing medical therapy.                               3. Menorrhagia.                               4. Dysmenorrhea.  POSTOPERATIVE DIAGNOSES:      1. Chronic pelvic pain.                               2. Endometriosis failing medical therapy.                               3. Menorrhagia.                               4. Dysmenorrhea.  OPERATION PERFORMED:         Total abdominal hysterectomy with left salpingo-oophorectomy.  SURGEON:                      Georgina Peer, M.D.  ASSISTANT:                    Henreitta Leber, P.A.  ANESTHESIA:                   General.  ESTIMATED BLOOD LOSS:         120 cc.  COMPLICATIONS:                None.  FINDINGS:                     A normal sized uterus.  Normal tubes and ovaries.  INDICATIONS:                  A 45 year old gravida 2, para 2 with a history of chronic pelvic pain, dysmenorrhea, menorrhagia, laparoscopy suspected endometriosis in the year 2000, and the patient was treated with oral contraceptives unsuccessfully, Lupron Depot successfully.  However, once the Lupron Depot was discontinued, the symptoms recurred, unresponsive to any other medical therapy.  The patient chose hysterectomy.  For full details, see the history and physical.  DESCRIPTION OF PROCEDURE:     The patient was taken to the operating room, given a general anesthetic and placed in the dorsal supine position.  The abdomen, perineum and vagina were prepped and draped in the normal sterile fashion.  A Foley catheter emptied her bladder.  A transverse incision was made through a previous Pfannenstiel incision, taken into the  upper abdominal cavity in  the normal Pfannenstiel manner.  Bleeders were cauterized. There were no abdominal adhesions to the anterior abdominal wall.  There were no adhesions to the right pelvic sidewall.  The uterus appeared normal.  The left ovary and tube appeared normal.  The uterosacrals appeared normal, although possible thickening of the uterosacrals was noted.  Self retaining retractors were placed.  The bowel was packed away and a bladder blade was placed over the bladder.  Two long Kelly clamps were placed across the cornua.  The round ligaments were bilaterally transected with cautery.  An anterior bladder flap was created.  There was some bladder flap scarring.  The right uteroovarian ligament was clamped, cut and suture ligated and the pelvic vessels skeletonized.  The left infundibulopelvic ligament was isolated free from the ureter, doubly clamped, cut, suture ligated and free tied.   The uterine vessels bilaterally were skeletonized, clamped, cut and suture ligated.  The cardinal ligaments were clamped, cut and suture ligated down to the uterosacral ligaments.  The uterosacral ligaments were clamped, cut and suture ligated.  The vagina was entered on the right and the specimen was removed, leaving the vaginal cuff.  The vaginal cuff corners were sutured and the mid portion closed with figure-of-eight of Vicryl.  Small bleeders on the bladder flap were cauterized.  There was excellent hemostasis.  The pelvis was irrigated.  Inspection under the fascia revealed excellent hemostasis.  The fascia was closed with Vicryl suture.  An alpha 200 pump was placed through a stab wound in the right mid quadrant three fingerbreadths above and lateral to the right edge of the incision.  Then, 20 cc of 0.25% Marcaine was injected into the subcutaneous and superfascial tissues.  The catheter was then placed. A subcutaneous suture was placed and the skin was closed with skin staples. Sponge,  needle and instrument counts were correct.  The patient was returned to the recovery area in good condition.  She received 1 g of Ancef prior to the procedure. DD:  06/02/00 TD:  06/03/00 Job: [id number]

## 2011-04-22 NOTE — Discharge Summary (Signed)
[facility]  Patient:    [name], [name]                    MRN: [id number] Adm. Date:  [id number] Disc. Date: [id number] Attending:  Pleas Koch                           Discharge Summary  ADMISSION DIAGNOSES:          1. Chronic pelvic pain.                               2. Endometriosis.                               3. Menorrhagia                               4. Dysmenorrhea.  DISCHARGE DIAGNOSES:          1. Chronic pelvic pain.                               2. Endometriosis.                               3. Menorrhagia.                               4. Dysmenorrhea.                               5. Seizure disorder.  BRIEF HISTORY:                This is a 45 year old gravida 2, para 2 with a history of chronic pelvic pain, dysmenorrhea, menorrhagia, and laparoscopically diagnosed endometriosis in the year 2000.  She was treated with Depo-Lupron, but had recurrent pain.  For details see history and physical.  The patient was admitted and underwent a total abdominal hysterectomy with left salpingo-oophorectomy under general anesthesia with 120 cc blood loss.  The operation was uneventful and the patient had 120 cc blood loss.  She was stable postoperatively.  The patient had an ______ 200 incisional pump placed.  She had good urine output on the first day.  The patient was doing well postoperatively.  She had good bowel sounds. Hemoglobin 10.6 postop from a preop of 13.2.  Her lungs were clear. Approximately 1300 she had a witnessed grand mal seizure and then revealed a history of a seizure disorder.  Neurologist was consulted, Dr. Anne Hahn.  She was given Ativan.  The patient was seen by Dr. Anne Hahn and was placed on Tegretol.  She continued to do well without further seizure activity. Hemoglobin 10.8, potassium 3.3.  WBC 3600.  She was then alert and oriented by the second day.  She was discharged in good condition on June 04, 2000  to follow up with Dr. Anne Hahn in one week, to continue the Tegretol, to follow up in the [facility] in two weeks.  She was given written discharge instructions and prescriptions. DD:  07/17/00 TD:  07/18/00 Job: [id number]

## 2011-04-22 NOTE — Discharge Summary (Signed)
NAME:  [name], [name]                       ACCOUNT NO.:  [id number]   MEDICAL RECORD NO.:  [id number]                   PATIENT TYPE:  INP   LOCATION:  5736                                 FACILITY:  [facility]   PHYSICIAN:  C. Ulyess Mort, M.D.             DATE OF BIRTH:  May 31, 1966   DATE OF ADMISSION:  07/13/2004  DATE OF DISCHARGE:  07/15/2004                                 DISCHARGE SUMMARY   DISCHARGE DIAGNOSES:  1. Intentional drug overdose.  2. Seizure disorder.  3. Depression.  4. Anemia.   DISCHARGE MEDICATIONS:  1. Zonegran 300 mg daily, split 100 mg q.a.m., 200 mg q.p.m.  2. Cenestin 0.625 mg p.o. daily.  3. Cymbalta 30 mg p.o. b.i.d.  4. Trazodone 50 mg one p.o. q.h.s.  5. Ambien 10 mg one p.o. q.h.s.   DISPOSITION AT DISCHARGE:  Stable.   FOLLOW UP:  [name] is to follow up with myself, Dr, Keitha Butte, on  July 21, 2004 at 2:30 p.m. in the [facility] and with Dr. Lesia Sago at [facility] on August 16 at 3 p.m.  Additionally, she is to be seen on August 12 at [facility] at 10 a.m. in the intensive outpatient program.   PROCEDURE:  Chest x-rays which were with no acute findings, no infiltrates  noted.   CONSULTATIONS:  Dr. Jeanie Sewer, psychiatry and Dr. Lesia Sago, neurology.   ADMISSION HISTORY:  [name] is a 45 year old Caucasian woman with  history of multiple prior suicide attempts, seizure disorder and depression  who was found by her brother on the morning of admission to be lethargic and  noted multiple medications missing.  It was believed that [name] had  taken all of her remaining Soma, Ambien and Valium. However, at admission it  was unclear exactly how many pills of which particular medication she  ingested.  She also had prescriptions for Risperdal, Lamictal, Zonegran,  Cenestin and Effexor.  The patient was brought in by EMS because of  hyperventilation and was  intubated on route.  However, she fought this and  was extubated before she arrived at the emergency department.  Upon arrival  to the emergency department, she was hyperventilating and obtunded.   PHYSICAL EXAMINATION:  VITAL SIGNS:  Blood pressure 90/56, pulse 56,  respirations 10, temperature 97.2.  GENERAL:  Thin, cachectic middle-aged white female lying on the stretcher  with sunken eyes and decreased responsiveness.  HEENT:  Head is normocephalic, atraumatic with no hemotympanum.  No gross  facial asymmetry was noted.  Eyes were noted to be somewhat sunken.  Pupils  were approximately 1-2 mm in size and very sluggish.  No gag reflex.  No  gross facial asymmetries noted.  NECK:  Supple with masses.  LUNGS:  Hypoventilation with shallow respirations and faint rhonchi  throughout.  ABDOMEN:  Soft and nontender without masses apparent.  LOWER EXTREMITIES:  Decreased tone throughout with no asymmetry.   ADMISSION LABS:  ABG:  PH 7.34, PCO2 40, Po2 112.  Admission CBC showed a  hemoglobin of 10.0, hematocrit 28.9, white blood cell 6.4, platelets 316,  sodium 138, potassium 3.8, chloride 112, bicarb 19, BUN 10, creatinine 1.0,  glucose 108, total bili 0.7, alk phos 50, AST 15, ALT 9, total protein 5.8,  albumin 2.8, calcium 8.2.  CK total 188, troponin I less than 0.1,  acetaminophen level less than 10, salicylate level less than 4.0.   PROBLEM:  1. Intentional drug overdose.  The patient was brought to the emergency     department.  Decision was made by the emergency department doctor to     intubate patient due to hypoventilation and decreased responsiveness and     she was admitted to the intensive care unit.  While in the intensive care     unit, the patient was felt to be stable enough to be extubated and     monitored overnight in the ICU before being transferred to a regular     floor bed the day following admission.  No evidence was seen on chest x-     ray of any aspiration,  infiltrates and it was not believed the patient     suffered any ill-affect from the drug overdose except for decreased     responsiveness. When transferred to the floor, she was followed with a     sitter and psychiatry and neurology were consulted to discuss her issues     of depression and her seizure disorder.   1. Seizure disorder.  On July 14, 2004 [name] was seen by Dr. Lesia Sago of neurology.  [name] is followed by [facility].  She was currently being transitioned off of Zonegran and     moving to Lamictal.  She was being changed because she was unable to     tolerate higher doses of Zonegran secondary to headaches.  Dr. Clarisa Kindred     assessment included recommending active psychiatric followup.  In terms     of her seizure disorder, he recommended continuing her Zonegran to     Lamictal transition with Zonegran with 300 mg.  Total daily 100 in the     morning, 200 at night and Lamictal 25 twice daily.  He also urges to     discontinue the Zanaflex and Diazepam.  This was started.  [name]     complained of a rash on day of discharge on the legs.  She was worried     this rash was secondary to her Lamictal.  However, she had been on the     Lamictal for an unknown period of time before this and was told that she     was going to gradually escalate that and that was she was being followed     by [facility] for this.  However, it was felt that     because followup appointment was made with Dr. Anne Hahn closely on August     16 that we would stop the Lamictal and continue her just on her Zonegran     until she follows up and Dr. Anne Hahn decides whether or not he wants to     choose another class of medications or if he feels that rash is not     related to the Lamictal.   1. Depression.  The  patient explained that she is undergoing multiple new     stressors in her life including her eldest son and her having     difficulties at home and choosing to live with her first husband.  She     explained to me that she has had a plan to take her own life, but at time     of discharge she believes she was stable and signed a contract for safety     with Dr. Jeanie Sewer of psychiatry who evaluated her before discharge.  He     recommended inpatient psychiatry to her.  However, Ms. Shek declined     this and his assessment that she is not currently committable.  She did     opt to go to [facility] for intensive outpatient     therapy.  It was recommended that she continue her Cymbalta 30 mg p.o.     twice daily and Trazodone 50 mg for insomnia and 10 mg Ambien in addition     only if the Trazodone fails.  Also, he noted that the Trazodone could be     increased by 50 mg daily up to 200 at night if she fails Ambien.  He also     recommended that she only receive one week supply of Ambien and Trazodone     and she was given only one week of Ambien and Trazodone and will follow     up with both myself and Dr. Anne Hahn at which time will be assessed whether     or not I will continue his medications.   1. Anemia.  The patient presented with a normocytic anemia.  She explained     to me that she has a history of hemorrhoids and has had some bright red     blood per rectum.  However, this has stopped.  Her ferritin levels, B12     and total iron levels were evaluated during her hospital stay.  Her CBC     revealed a hemoglobin of 10.4 with an MCV of 97.2, normocytic anemia.     This will be followed in the outpatient clinic by myself.  Given her     ferritin level of 62, B12 455, it was not felt that this anemia was     secondary to iron deficiency or B12 deficiency.  Will continue to monitor     this.   DISCHARGE LABS:  Urinalysis was negative.  Total iron 37, ferritin 62, B12  455, hemoglobin 10.4, hematocrit 31.0, white blood cell 5.3, platelets 333,  sodium 136, potassium 3.9, chloride 108,  bicarb 25, BUN 4, creatinine 1.0,  glucose 83.      Keitha Butte, MD                       Gary Fleet, M.D.    AK/MEDQ  D:  07/15/2004  T:  07/16/2004  Job:  [id number]   cc:   Madaline Guthrie, M.D.  [address]  [place] [address]  Fax: [phone]   Antonietta Breach, M.D.   Marlan Palau, M.D.  [address]  [address]  [place]  [place] [address]  Fax: [phone]

## 2011-04-22 NOTE — Op Note (Signed)
NAMEVONNETTA, [name]             ACCOUNT NO.:  [id number]   MEDICAL RECORD NO.:  [id number]          PATIENT TYPE:  AMB   LOCATION:  SDS                          FACILITY:  [facility]   PHYSICIAN:  Adolph Pollack, M.D.DATE OF BIRTH:  28-Sep-1966   DATE OF PROCEDURE:  01/09/2007  DATE OF DISCHARGE:                               OPERATIVE REPORT   PREOPERATIVE DIAGNOSIS:  Abnormal mammogram left breast  (microcalcifications).   POSTOPERATIVE DIAGNOSIS:  Abnormal mammogram left breast  (microcalcifications).   PROCEDURE:  Left breast biopsy after needle localization.   SURGEON:  Adolph Pollack, M.D.   ANESTHESIA:  General plus 1% lidocaine and 0.5% Marcaine for local  anesthesia.   INDICATION:  Ms. Hossain is a 45 year old female who underwent a  screening mammogram.  She had suspicious looking microcalcifications in  the left breast.  Her breast size was small and thus image-guided biopsy  was not able to be done.  She now presents for the appropriate  procedure.   TECHNIQUE:  She underwent a successful needle localization at the breast  center.  She was then seen in the holding room and the left breast  marked with my initials.  She is brought to the operating room, placed  supine on the operating table, and a general anesthetic was  administered.  The bandage was removed and the wire was partially cut  externally.  The wire and left breast were sterilely prepped and draped.  In the lower outer quadrant, a curvilinear incision was made through the  skin and subcutaneous tissue, then I threaded the wire through the skin.  I then excised a cylinder-shaped portion of tissue around the wire down  to the chest wall and sent it for a specimen mammogram.  The  microcalcifications were contained within the specimen.   Following this, I used electrocautery and sutures to obtain hemostasis.  Once hemostasis was adequate, I injected Marcaine solution into the  wound.  I then closed  the subcutaneous tissue loosely with interrupted 3-  0 Vicryl sutures.  The skin was closed with a 4-0 Monocryl subcuticular  stitch followed by Steri-Strips and a sterile dressing.   She tolerated the procedure without any apparent complications and was  taken to the recovery room in satisfactory condition.      Adolph Pollack, M.D.  Electronically Signed    TJR/MEDQ  D:  01/09/2007  T:  01/09/2007  Job:  [id number]   cc:   Daryl Eastern, M.D.  Tera Mater. Clent Ridges, MD

## 2011-04-22 NOTE — H&P (Signed)
[facility]  Patient:    [name], [name]                    MRN: [id number] Adm. Date:  [id number] Disc. Date: [id number] Attending:  Pleas Koch                         History and Physical  ADMISSION DIAGNOSES:  Chronic pelvic pain, endometriosis, menorrhagia, unresponsive to medical management and conservative surgical management.  HISTORY OF PRESENT ILLNESS:  This is a 45 year old, gravida 2, para 2, status post bilateral tubal ligation, status post conservative right salpingo-oophorectomy and appendectomy in January of 2000 for right-sided pain, status post six months of Lupron, which controlled her symptoms temporarily, but then the returned, here for definitive total abdominal hysterectomy and left salpingo-oophorectomy.  The patient had had heavy periods, normal ultrasound.  The patient has been evaluated by urologist and GI specialist, which could not find other etiology for her pelvic pain.  The patient agrees with this and knows the risks and complications of this procedure including bowel, bladder, vascular injury, fistula formation, and the possibility that the pain may not be completely or partially resolved by this procedure.  PAST MEDICAL HISTORY:  Previous tubal ligation, previous two deliveries, previous right salpingo-oophorectomy.  No other ongoing medical problem.  PAST SURGICAL HISTORY:  Cholecystectomy in 1997, tonsillectomy in 1974.  FAMILY HISTORY:  The patient has a family history of diabetes, hypertension, heart disease, heart attack, and cancer.  SOCIAL HISTORY:  Nonsmoker.  No excessive alcohol or drug abuse.  ALLERGIES:  No known drug allergies.  REVIEW OF SYSTEMS:  Otherwise negative.  PHYSICAL EXAMINATION:  GENERAL:  A well-developed, thin white female.  VITAL SIGNS:  Blood pressure 110/70.  Awake and oriented.  HEENT:  Normal.  NECK:  Without thyromegaly or JVD.  CHEST:  Clear to auscultation and  percussion.  HEART:  Regular rate and rhythm.  BREASTS:  Without masses.  ABDOMEN:  Soft with mild tenderness in the suprapubic and right lower quadrant.  No rebound or guarding.  No inguinal lymphadenopathy.  GENITALIA:  Normal external genitals, normal vagina without discharge.  No cervical motion tenderness.  The uterus is normal size, nontender.  There is tenderness of the uterosacral ligaments without nodularity.  In the adnexa there is tenderness in the right without mass and negative left. Rectovaginal is noncontributory.  IMPRESSION:  Chronic pelvic and menorrhagia, unresponsive to medical management.  PLAN:  The patient is admitted for TAH, LSO at [facility] on June 02, 2000. DD:  06/02/00 TD:  06/05/00 Job: [id number]

## 2011-04-22 NOTE — Consult Note (Signed)
NAME:  [name], [name]                       ACCOUNT NO.:  [id number]   MEDICAL RECORD NO.:  [id number]                   PATIENT TYPE:  INP   LOCATION:  2931                                 FACILITY:  [facility]   PHYSICIAN:  Marlan Palau, M.D.               DATE OF BIRTH:  [birth year]   DATE OF CONSULTATION:  07/14/2004  DATE OF DISCHARGE:                                   CONSULTATION   [name] is a 45 year old white female born 1966-09-05 with  history of seizures, depression, muscular contraction type headache.  The  patient has been followed through [facility] who is  currently being transitioned off of Zonegran to Lamictal.  This patient was  unable to tolerate higher doses of Zonegran due to headaches.  This patient  has a history of multiple attempted suicide in the past and drug overdose,  last in 1986.  The patient was brought into the hospital at this point with  a drug overdose having taken large doses of Cymbalta, Ambien, Soma, Valium,  Zanaflex and Ultram.  The patient required brief intubation during this  hospitalization, but now is recovering nicely.  The patient is bright, alert  and cooperative.  The patient has had seizure type event last documented in  January 2005.  Otherwise, has not had any seizures during or around this  hospitalization.  Neurology is asked to see this patient for further  evaluation following this drug overdose.   PAST MEDICAL HISTORY:  1. History of drug overdose suicide attempt this admission.  2. Depression.  3. Seizures.  4. Headaches that are chronic daily in nature.  5. History of appendectomy.  6. Tonsillectomy.  7. Gallbladder resection.  8. Status post hysterectomy.  9. History of lumbosacral spine surgery in the past.   MEDICATIONS PRIOR TO ADMISSION:  1. Zonegran 100 mg in the morning, 200 mg in the evening.  2. The patient is on Lamictal to go to 25 mg twice daily, eventually working  up to 100 mg twice daily.  3. Cymbalta 20 mg daily, to go to 40 mg daily in the near future.  4. Cenestin 0.625 mg daily.  5. Risperdal 1 to 2 mg at night.  6. Ambien 10 to 20 mg at night for sleep.  7. Valium 5 mg if needed.   The patient has no known allergies.  Does not currently smoke or drink.   SOCIAL HISTORY:  [place]  and is [place].  Lives with her husband.  Works as a Futures trader.  Has two  children, all alive and well.   FAMILY HISTORY:  Notable that mother is alive with history of mini-stroke.  Father is alive with history of myocardial infarction x2.  The patient has  two brothers; one with HIV infection.  Cancer and heart disease run in the  family.   REVIEW OF SYSTEMS:  Noted for some recent low grade fever, chills.  The  patient has daily chronic headaches.  Denies any visual field changes.  Has  some numbness to the left ear.  Has no problems with speech, swallowing.  Has some slight left upper chest pain.  Denies shortness of breath,  abdominal pain.  The patient has problems with intermittent constipation and  diarrhea.  Feels weak all over.  Has had some weight loss recently from 108  pounds to 102 pounds.   PHYSICAL EXAMINATION:  VITAL SIGNS:  Blood pressure currently 110/60, heart  rate 64, respiratory 10, temperature afebrile.  GENERAL:  This patient is a thin, white female who is alert and cooperative  at the time of examination.  HEENT:  Head is atraumatic.  Eyes:  Pupils are equal, round and reactive to  light.  Disks are flat bilaterally.  NECK:  Supple.  No carotid bruits noted.  RESPIRATORY:  Clear.  CARDIOVASCULAR:  Reveals regular  rate and rhythm.  No obvious murmurs or  rubs noted.  EXTREMITIES:  Without significant edema.  NEUROLOGIC:  Cranial nerves II-XII as above.  Facial symmetry is present.  The patient has good sensation to face pinprick and soft touch bilaterally.  Has good strength of  facial muscles, the muscles of the head turning,  shoulder shrug bilaterally.  Speech is fairly well enunciated and not  aphasic.  Motor testing reveals good strength on all fours.  Good symmetric  motor and tone throughout.  Sensory testing is intact to pinprick, soft  touch, vibratory sensation throughout.  The patient has fair finger-nose-to-  finger, and toe-to-finger bilaterally.  Some mild tremor, action type noted  in the upper extremities bilaterally.  The patient has symmetric, but  depressed reflexes.  Toes neutral bilaterally.  No drift is seen.   Laboratory values noted before blood gases pH 7.454 on admission, Pco2 26.7,  PO2 324, bicarb 18.9, white count 6.4, hemoglobin 10.0, hematocrit 28.0, MCV  96.0, platelets 316.  Again, creatinine 1.0 on admission.   EKG reveals sinus bradycardia, otherwise normal EKG.  Heart rate is 56.   IMPRESSION:  1. History of seizures.  2. Drug overdose, history of depression.  3. History of chronic daily headache.   This patient has had multiple drug overdose attempts in the past, but has  been almost 20 years since her last suicide attempt.  The patient will  require active psychiatric followup following this hospitalization.  Will  continue to transition from Zonegran to Lamictal, keep Zonegran at 300 mg  total daily.  The patient will go to Lamictal 25 mg b.i.d. Will need to  discontinue the Zanaflex, Diazepam.  Will follow the patient's clinical  course while in-house.                                               Marlan Palau, M.D.    CKW/MEDQ  D:  07/14/2004  T:  07/14/2004  Job:  [id number]   cc:   Catalina Pizza, M.D.  [address] [place], [place] [address]  Fax: [phone]   Madaline Guthrie, M.D.  [address]  [place] [address]  Fax: [phone]

## 2011-04-22 NOTE — Discharge Summary (Signed)
NAMEKINNEDY, MONGIELLO             ACCOUNT NO.:  [id number]   MEDICAL RECORD NO.:  [id number]          PATIENT TYPE:  IPS   LOCATION:  0407                          FACILITY:  [facility]   PHYSICIAN:  Anselm Jungling, MD  DATE OF BIRTH:  20-Sep-1966   DATE OF ADMISSION:  03/17/2009  DATE OF DISCHARGE:  03/20/2009                               DISCHARGE SUMMARY   IDENTIFYING DATA/REASON FOR ADMISSION:  The patient is a 45 year old  married white female admitted for severe depression.  This was her  second [facility] admission, her first having been in November 2009.  I was  stressing a little too much and I held it in.  She states that she  became suicidal and cut herself in numerous locations on her body with  a razor.  She had not been in any current outpatient treatment.  Her  neurologist is Dr. Anne Hahn, whom she sees for her seizure disorder.  Please refer to the admission note for further details pertaining to the  symptoms, circumstances and history that led to her hospitalization.  She was given an initial Axis I diagnosis of major depressive disorder,  recurrent.   MEDICAL/LABORATORY:  The patient was medically and physically assessed  by the psychiatric nurse practitioner.  She was in good health with the  exception of her seizure disorder.  She was continued on her usual  Zonegran 200 mg q.a.m. and 300 mg q.h.s.  She was also continued on her  usual Premarin 0.625 mg daily.   HOSPITAL COURSE:  The patient was admitted to the adult inpatient  psychiatric service.  She presented as a well-nourished, normally-  developed adult female who was alert, fully oriented, and nonpsychotic.  Her mood was depressed, but she denied suicidal ideation.  She stated  I'd rather be home.  She indicated that she did not feel that she  needed to be in this inpatient setting.  She indicated that she was not  interested in any trials of any antidepressant medication or any other  mood regulating  medication.   The patient gave permission to contact her husband, who subsequently  came in for a family conference.  The patient accepted referral to  outpatient treatment following her discharge.  She was absent suicidal  ideation throughout her entire stay.  She agreed to the following  aftercare plan:   AFTERCARE:  The patient was to follow up at [facility]  [facility]sel[facility], appointment time to be arranged.   DISCHARGE MEDICATIONS:  1. Premarin 0.625 mg daily.  2. Zonegran 200 mg q.a.m. and 300 mg q.h.s.  3. Ambien 20 mg q.h.s.   DISCHARGE DIAGNOSES:  AXIS I:  Major depressive disorder, recurrent.  AXIS II:  Deferred.  AXIS III:  Seizure disorder.  AXIS IV:  Stressors severe.  AXIS V:  Global assessment of functioning on discharge 55.      Anselm Jungling, MD  Electronically Signed     SPB/MEDQ  D:  03/26/2009  T:  03/26/2009  Job:  [id number]

## 2011-06-10 ENCOUNTER — Other Ambulatory Visit: Payer: Self-pay | Admitting: Family Medicine

## 2011-09-06 LAB — CBC
HCT: 34.5 — ABNORMAL LOW
Hemoglobin: 11.9 — ABNORMAL LOW
MCHC: 34.5
MCV: 97.1
Platelets: 277
RBC: 3.56 — ABNORMAL LOW
RDW: 12.8
WBC: 5.7

## 2011-09-06 LAB — RAPID URINE DRUG SCREEN, HOSP PERFORMED
Amphetamines: NOT DETECTED
Barbiturates: NOT DETECTED
Benzodiazepines: POSITIVE — AB
Cocaine: NOT DETECTED
Opiates: POSITIVE — AB
Tetrahydrocannabinol: NOT DETECTED

## 2011-09-06 LAB — URINALYSIS, ROUTINE W REFLEX MICROSCOPIC
Bilirubin Urine: NEGATIVE
Glucose, UA: NEGATIVE
Hgb urine dipstick: NEGATIVE
Ketones, ur: NEGATIVE
Protein, ur: NEGATIVE
Urobilinogen, UA: 1

## 2011-09-06 LAB — POCT I-STAT, CHEM 8
Calcium, Ion: 1.3
Creatinine, Ser: 1.3 — ABNORMAL HIGH
Glucose, Bld: 87
HCT: 36
Hemoglobin: 12.2
Potassium: 4.6
TCO2: 27

## 2011-09-06 LAB — DIFFERENTIAL
Basophils Absolute: 0
Basophils Relative: 1
Eosinophils Absolute: 0.1
Eosinophils Relative: 2
Lymphocytes Relative: 33
Lymphs Abs: 1.9
Monocytes Absolute: 0.4
Monocytes Relative: 7
Neutro Abs: 3.2
Neutrophils Relative %: 57

## 2011-09-06 LAB — ETHANOL: Alcohol, Ethyl (B): <5

## 2011-09-22 ENCOUNTER — Telehealth: Payer: Self-pay | Admitting: Family Medicine

## 2011-09-22 NOTE — Telephone Encounter (Signed)
Refill request for Xanax 2 mg take 1 po tid prn and pt last here on 01/10/11.

## 2011-09-23 MED ORDER — ALPRAZOLAM 2 MG PO TABS: 2.0000 mg | ORAL_TABLET | Freq: Three times a day (TID) | ORAL | Status: AC

## 2011-09-23 NOTE — Telephone Encounter (Signed)
Script called in

## 2011-09-23 NOTE — Telephone Encounter (Signed)
Pharmacist called to get status of refill of Xanax 2 mg. Pls call med in today.

## 2011-09-23 NOTE — Telephone Encounter (Signed)
Call in #90 with 5 rf 

## 2011-10-04 ENCOUNTER — Other Ambulatory Visit: Payer: Self-pay | Admitting: Family Medicine

## 2011-10-04 DIAGNOSIS — Z1231 Encounter for screening mammogram for malignant neoplasm of breast: Secondary | ICD-10-CM

## 2011-10-11 ENCOUNTER — Other Ambulatory Visit: Payer: Self-pay | Admitting: Family Medicine

## 2011-11-14 ENCOUNTER — Ambulatory Visit
Admission: RE | Admit: 2011-11-14 | Discharge: 2011-11-14 | Disposition: A | Discharge status: Home / Self Care | Payer: Managed Care, Other (non HMO) | Source: Ambulatory Visit | Attending: Family Medicine | Admitting: Family Medicine

## 2011-11-14 ENCOUNTER — Ambulatory Visit: Payer: Managed Care, Other (non HMO)

## 2011-11-14 DIAGNOSIS — Z1231 Encounter for screening mammogram for malignant neoplasm of breast: Secondary | ICD-10-CM

## 2012-01-27 ENCOUNTER — Encounter: Payer: Self-pay | Admitting: Family Medicine

## 2012-01-27 ENCOUNTER — Ambulatory Visit (INDEPENDENT_AMBULATORY_CARE_PROVIDER_SITE_OTHER): Payer: Managed Care, Other (non HMO) | Admitting: Family Medicine

## 2012-01-27 VITALS — BP 150/78 | HR 68 | Temp 99.0°F | Wt 96.0 lb

## 2012-01-27 DIAGNOSIS — I10 Essential (primary) hypertension: Secondary | ICD-10-CM

## 2012-01-27 MED ORDER — LISINOPRIL 10 MG PO TABS: 10.0000 mg | ORAL_TABLET | Freq: Every day | ORAL | Status: DC

## 2012-01-27 NOTE — Progress Notes (Signed)
  Subjective:    Patient ID: [name], female    DOB: November 18, 1966, 46 y.o.   MRN: [id number]  HPI Here for elevated BP for the past few months. She has felt fine, but her BP was high on several visits to her neurologist, Dr. Anne Hahn. Last week he gave her Verapamil ER 120 mg to take, but she stopped this after a single dose because it made her feel tired.    Review of Systems  Constitutional: Negative.   Respiratory: Negative.   Cardiovascular: Negative.        Objective:   Physical Exam  Constitutional: She appears well-developed and well-nourished.  Neck: No thyromegaly present.  Cardiovascular: Normal rate, regular rhythm, normal heart sounds and intact distal pulses.   Pulmonary/Chest: Effort normal and breath sounds normal.  Lymphadenopathy:    She has no cervical adenopathy.          Assessment & Plan:  Start on Lisinopril 10 mg a day. She is set to see [place] in April for a cpx and labs.

## 2012-03-05 ENCOUNTER — Other Ambulatory Visit: Payer: Self-pay | Admitting: Family Medicine

## 2012-03-05 NOTE — Telephone Encounter (Signed)
Ok to refill x 1 month

## 2012-03-06 ENCOUNTER — Other Ambulatory Visit (INDEPENDENT_AMBULATORY_CARE_PROVIDER_SITE_OTHER): Payer: Managed Care, Other (non HMO)

## 2012-03-06 DIAGNOSIS — Z0000 Encounter for general adult medical examination without abnormal findings: Secondary | ICD-10-CM

## 2012-03-06 LAB — BASIC METABOLIC PANEL
BUN: 13 mg/dL (ref 6–23)
CO2: 25 mEq/L (ref 19–32)
Calcium: 9 mg/dL (ref 8.4–10.5)
Chloride: 106 mEq/L (ref 96–112)
Creatinine, Ser: 1 mg/dL (ref 0.4–1.2)

## 2012-03-06 LAB — CBC WITH DIFFERENTIAL/PLATELET
Basophils Absolute: 0 10*3/uL (ref 0.0–0.1)
Eosinophils Absolute: 0.1 10*3/uL (ref 0.0–0.7)
Lymphocytes Relative: 34.2 % (ref 12.0–46.0)
MCHC: 33.7 g/dL (ref 30.0–36.0)
MCV: 99.7 fl (ref 78.0–100.0)
Monocytes Absolute: 0.2 10*3/uL (ref 0.1–1.0)
Neutrophils Relative %: 56.3 % (ref 43.0–77.0)
Platelets: 251 10*3/uL (ref 150.0–400.0)

## 2012-03-06 LAB — HEPATIC FUNCTION PANEL
Bilirubin, Direct: 0 mg/dL (ref 0.0–0.3)
Total Bilirubin: 0.2 mg/dL — ABNORMAL LOW (ref 0.3–1.2)
Total Protein: 7.4 g/dL (ref 6.0–8.3)

## 2012-03-06 LAB — POCT URINALYSIS DIPSTICK
Ketones, UA: NEGATIVE
Spec Grav, UA: 1.02
pH, UA: 7

## 2012-03-06 LAB — LIPID PANEL
Cholesterol: 153 mg/dL (ref 0–200)
HDL: 70.5 mg/dL (ref >39.00)
Triglycerides: 101 mg/dL (ref 0.0–149.0)
VLDL: 20.2 mg/dL (ref 0.0–40.0)

## 2012-03-13 ENCOUNTER — Ambulatory Visit (INDEPENDENT_AMBULATORY_CARE_PROVIDER_SITE_OTHER): Payer: Managed Care, Other (non HMO) | Admitting: Family Medicine

## 2012-03-13 ENCOUNTER — Encounter: Payer: Self-pay | Admitting: Family Medicine

## 2012-03-13 VITALS — BP 148/90 | HR 96 | Temp 99.1°F | Ht 61.0 in | Wt 94.0 lb

## 2012-03-13 DIAGNOSIS — Z0000 Encounter for general adult medical examination without abnormal findings: Secondary | ICD-10-CM

## 2012-03-13 MED ORDER — BUTALBITAL-APAP-CAFFEINE 50-325-40 MG PO TABS: 1.0000 | ORAL_TABLET | Freq: Four times a day (QID) | ORAL | Status: AC | PRN

## 2012-03-13 MED ORDER — HYDROCHLOROTHIAZIDE 12.5 MG PO CAPS: 12.5000 mg | ORAL_CAPSULE | Freq: Every day | ORAL | Status: DC

## 2012-03-13 MED ORDER — ESTROGENS CONJUGATED 0.625 MG PO TABS: 0.6250 mg | ORAL_TABLET | Freq: Every day | ORAL | Status: DC

## 2012-03-13 NOTE — Progress Notes (Signed)
  Subjective:    Patient ID: [name], female    DOB: 1966-11-15, 46 y.o.   MRN: [id number]  HPI 46 yr old female for a cpx. She is doing well except for her chronic HAs. She still sees Dr. Anne Hahn about twice a year. She takes Vicodin 2-3 times daily. She has used Fioricet in the past, and she asks if she can try this again. She stopped taking Lisinopril after a few days because she thinks it made her tired.    Review of Systems  Constitutional: Negative.   HENT: Negative.   Eyes: Negative.   Respiratory: Negative.   Cardiovascular: Negative.   Gastrointestinal: Negative.   Genitourinary: Negative for dysuria, urgency, frequency, hematuria, flank pain, decreased urine volume, enuresis, difficulty urinating, pelvic pain and dyspareunia.  Musculoskeletal: Negative.   Skin: Negative.   Neurological: Negative.   Hematological: Negative.   Psychiatric/Behavioral: Negative.        Objective:   Physical Exam  Constitutional: She is oriented to person, place, and time. She appears well-developed and well-nourished. No distress.  HENT:  Head: Normocephalic and atraumatic.  Right Ear: External ear normal.  Left Ear: External ear normal.  Nose: Nose normal.  Mouth/Throat: Oropharynx is clear and moist. No oropharyngeal exudate.  Eyes: Conjunctivae and EOM are normal. Pupils are equal, round, and reactive to light. No scleral icterus.  Neck: Normal range of motion. Neck supple. No JVD present. No thyromegaly present.  Cardiovascular: Normal rate, regular rhythm, normal heart sounds and intact distal pulses.  Exam reveals no gallop and no friction rub.   No murmur heard. Pulmonary/Chest: Effort normal and breath sounds normal. No respiratory distress. She has no wheezes. She has no rales. She exhibits no tenderness.  Abdominal: Soft. Bowel sounds are normal. She exhibits no distension and no mass. There is no tenderness. There is no rebound and no guarding.  Musculoskeletal: Normal  range of motion. She exhibits no edema and no tenderness.  Lymphadenopathy:    She has no cervical adenopathy.  Neurological: She is alert and oriented to person, place, and time. She has normal reflexes. No cranial nerve deficit. She exhibits normal muscle tone. Coordination normal.  Skin: Skin is warm and dry. No rash noted. No erythema.  Psychiatric: She has a normal mood and affect. Her behavior is normal. Judgment and thought content normal.          Assessment & Plan:  Well exam. Try HCTZ for the HTN. Try Fioricet.

## 2012-03-13 NOTE — Progress Notes (Signed)
Quick Note:  Pt was here in office on 03/13/12 and went over results. ______

## 2012-04-12 ENCOUNTER — Other Ambulatory Visit: Payer: Self-pay | Admitting: Family Medicine

## 2012-04-12 NOTE — Telephone Encounter (Addendum)
Pt was seen on 4-9 and still having sleeping issues. Pt would like a rx for sleeping pills [facility] [address]

## 2012-04-13 MED ORDER — TEMAZEPAM 30 MG PO CAPS: 30.0000 mg | ORAL_CAPSULE | Freq: Every evening | ORAL | Status: DC | PRN

## 2012-04-13 NOTE — Telephone Encounter (Signed)
Call in Temazepam 30 mg qhs, #30 with 5 rf 

## 2012-04-13 NOTE — Telephone Encounter (Signed)
I called in script and spoke with pt. 

## 2012-04-16 ENCOUNTER — Telehealth: Payer: Self-pay | Admitting: Family Medicine

## 2012-04-16 NOTE — Telephone Encounter (Signed)
Refill request for Alprazolam 2 mg take 1 po tid prn and pt last here on 03/13/12.

## 2012-04-17 MED ORDER — ALPRAZOLAM 2 MG PO TABS: 2.0000 mg | ORAL_TABLET | Freq: Three times a day (TID) | ORAL | Status: AC | PRN

## 2012-04-17 MED ORDER — ALPRAZOLAM 2 MG PO TABS: 2.0000 mg | ORAL_TABLET | Freq: Three times a day (TID) | ORAL | Status: DC | PRN

## 2012-04-17 NOTE — Telephone Encounter (Signed)
Call in #90 with 5 rf 

## 2012-04-17 NOTE — Telephone Encounter (Signed)
I called in script 

## 2012-10-01 ENCOUNTER — Other Ambulatory Visit: Payer: Self-pay | Admitting: Family Medicine

## 2012-10-03 NOTE — Telephone Encounter (Signed)
Call in #30 with 5 rf 

## 2012-10-08 ENCOUNTER — Other Ambulatory Visit: Payer: Self-pay | Admitting: Family Medicine

## 2012-10-08 DIAGNOSIS — Z1231 Encounter for screening mammogram for malignant neoplasm of breast: Secondary | ICD-10-CM

## 2012-11-14 ENCOUNTER — Ambulatory Visit
Admission: RE | Admit: 2012-11-14 | Discharge: 2012-11-14 | Disposition: A | Discharge status: Home / Self Care | Payer: Managed Care, Other (non HMO) | Source: Ambulatory Visit | Attending: Family Medicine | Admitting: Family Medicine

## 2012-11-14 DIAGNOSIS — Z1231 Encounter for screening mammogram for malignant neoplasm of breast: Secondary | ICD-10-CM

## 2012-12-07 ENCOUNTER — Other Ambulatory Visit: Payer: Self-pay | Admitting: Family Medicine

## 2012-12-11 NOTE — Telephone Encounter (Signed)
Call in #90 with 5 rf 

## 2013-02-20 ENCOUNTER — Encounter: Payer: Self-pay | Admitting: Neurology

## 2013-02-20 DIAGNOSIS — R519 Headache, unspecified: Secondary | ICD-10-CM | POA: Insufficient documentation

## 2013-02-20 DIAGNOSIS — S161XXA Strain of muscle, fascia and tendon at neck level, initial encounter: Secondary | ICD-10-CM | POA: Insufficient documentation

## 2013-02-20 DIAGNOSIS — R51 Headache: Secondary | ICD-10-CM

## 2013-02-20 DIAGNOSIS — S139XXA Sprain of joints and ligaments of unspecified parts of neck, initial encounter: Secondary | ICD-10-CM

## 2013-02-20 DIAGNOSIS — IMO0002 Reserved for concepts with insufficient information to code with codable children: Secondary | ICD-10-CM | POA: Insufficient documentation

## 2013-03-15 ENCOUNTER — Other Ambulatory Visit: Payer: Self-pay | Admitting: Family Medicine

## 2013-03-19 ENCOUNTER — Ambulatory Visit (INDEPENDENT_AMBULATORY_CARE_PROVIDER_SITE_OTHER): Payer: Managed Care, Other (non HMO) | Admitting: Neurology

## 2013-03-19 ENCOUNTER — Encounter: Payer: Self-pay | Admitting: Neurology

## 2013-03-19 VITALS — BP 160/90 | HR 68 | Ht 62.0 in | Wt 100.0 lb

## 2013-03-19 DIAGNOSIS — R569 Unspecified convulsions: Secondary | ICD-10-CM

## 2013-03-19 DIAGNOSIS — R51 Headache: Secondary | ICD-10-CM

## 2013-03-19 MED ORDER — PREGABALIN 50 MG PO CAPS: 50.0000 mg | ORAL_CAPSULE | Freq: Every day | ORAL | Status: DC

## 2013-03-19 MED ORDER — HYDROCODONE-ACETAMINOPHEN 5-325 MG PO TABS: 1.0000 | ORAL_TABLET | Freq: Four times a day (QID) | ORAL | Status: DC | PRN

## 2013-03-19 NOTE — Patient Instructions (Signed)
Will taper off of the Vimpat by one tablet every 2 weeks.

## 2013-03-19 NOTE — Progress Notes (Signed)
Reason for visit: Seizures  [name] is an 47 y.o. female  History of present illness:  [name] is a 47 year old right-handed white female with a history of seizures and frequent headaches. The patient has not had any seizures since last seen on the Vimpat taking 50 mg twice daily. The patient however, indicates that this medication has resulted in increased depression and suicidal ideation. The patient continues to have her headache on a regular basis. The patient has been able to gain about 4 pounds since last seen. The patient otherwise has not had any new medical issues that have come up since last seen. The patient reports a lot of discomfort in the neck area, particularly when waking up in the morning. Many of her migraine headaches come from the back of the head.  Past Medical History  Diagnosis Date  . Seizures     sees Dr. Anne [name]  . Anxiety   . Depression   . Insomnia   . Headache     migraine  . Hypertension   . Suicidal ideation     Past Surgical History  Procedure Laterality Date  . Abdominal hysterectomy      2001  . Cholecystectomy    . Breast surgery  01/2007    lumpectomy...left breast. Dr. Abbey [name]  . Oophorectomy    . Tonsillectomy    . Cesarean section    . Appendectomy      Family History  Problem Relation Age of Onset  . Cirrhosis Mother   . Heart disease Father   . Hypertension Father   . Thyroid cancer Brother     Social history:  reports that she has never smoked. She has never used smokeless tobacco. She reports that she does not drink alcohol or use illicit drugs.  Allergies: No Known Allergies  Medications:  Current Outpatient Prescriptions on File Prior to Visit  Medication Sig Dispense Refill  . alprazolam (XANAX) 2 MG tablet TAKE 1 TABLET 3 TIMES A DAY AS NEEDED  90 tablet  5  . butalbital-acetaminophen-caffeine (FIORICET WITH CODEINE) 50-325-40-30 MG per capsule Take 1 capsule by mouth every 6 (six) hours as needed for  headache.      Marland Kitchen PREMARIN 0.625 MG tablet TAKE 1 TABLET (0.625 MG TOTAL) BY MOUTH DAILY. TAKE DAILY FOR 21 DAYS THEN DO NOT TAKE FOR 7 DAYS.  30 tablet  10  . temazepam (RESTORIL) 30 MG capsule TAKE ONE CAPSULE BY MOUTH AT BEDTIME  30 capsule  5  . zonisamide (ZONEGRAN) 100 MG capsule Take 100 mg by mouth daily. 3 tabs am 3 tabs at night       . hydrochlorothiazide (MICROZIDE) 12.5 MG capsule Take 1 capsule (12.5 mg total) by mouth daily.  30 capsule  11   No current facility-administered medications on file prior to visit.    ROS:  Out of a complete 14 system review of symptoms, the patient complains only of the following symptoms, and all other reviewed systems are negative.  Weight gain Ringing in the ears Itching Headache Dizziness Depression, and decreased energy, disinterest in activities, suicidal ideation Insomnia  Blood pressure 160/90, pulse 68, height 5\' 2"  (1.575 m), weight 100 lb (45.36 kg).  Physical Exam  General: The patient is alert and cooperative at the time of the examination. The patient appears to be thin, emaciated.  Skin: No significant peripheral edema is noted.   Neurologic Exam  Cranial nerves: Facial symmetry is present. Speech is normal, no aphasia or  dysarthria is noted. Extraocular movements are full. Visual fields are full.  Motor: The patient has good strength in all 4 extremities.  Coordination: The patient has good finger-nose-finger and heel-to-shin bilaterally.  Gait and station: The patient has a normal gait. Tandem gait is normal. Romberg is negative. No drift is seen.  Reflexes: Deep tendon reflexes are symmetric.   Assessment/Plan:  1. History seizures  2. Headache  3. Depression  The patient has had tolerance issues with the Vimpat, as this has worsened her depression. The patient will be tapered off of this medication going to 50 mg at night for 2 weeks, and then she will stop the drug. Lyrica will be added, but the  patient is once again cautioned that this may also worsen depression. The patient will be started on 50 mg at night. If the patient tolerates this medication, and it helps her headache and seizures, the dose can be increased. The patient will followup in 5 or 6 months.  Shannon Palau MD 03/19/2013 6:37 PM  [facility] [address] [place], [place] [address]  Phone [phone] Fax [phone]

## 2013-03-27 ENCOUNTER — Other Ambulatory Visit: Payer: Self-pay | Admitting: Family Medicine

## 2013-03-28 NOTE — Telephone Encounter (Signed)
Call in #30 with no further refills. She needs an OV soon

## 2013-04-06 ENCOUNTER — Other Ambulatory Visit: Payer: Self-pay | Admitting: Family Medicine

## 2013-04-08 NOTE — Telephone Encounter (Signed)
Can we refill this? 

## 2013-04-16 ENCOUNTER — Encounter: Payer: Self-pay | Admitting: Family Medicine

## 2013-04-16 ENCOUNTER — Ambulatory Visit (INDEPENDENT_AMBULATORY_CARE_PROVIDER_SITE_OTHER): Payer: Managed Care, Other (non HMO) | Admitting: Family Medicine

## 2013-04-16 VITALS — BP 126/70 | HR 99 | Temp 98.2°F | Wt 102.0 lb

## 2013-04-16 DIAGNOSIS — I10 Essential (primary) hypertension: Secondary | ICD-10-CM

## 2013-04-16 MED ORDER — TEMAZEPAM 30 MG PO CAPS: 60.0000 mg | ORAL_CAPSULE | Freq: Every evening | ORAL | Status: DC | PRN

## 2013-04-16 MED ORDER — METOPROLOL SUCCINATE ER 50 MG PO TB24: 50.0000 mg | ORAL_TABLET | Freq: Every day | ORAL | Status: DC

## 2013-04-16 NOTE — Progress Notes (Signed)
  Subjective:    Patient ID: [name], female    DOB: June 30, 1966, 47 y.o.   MRN: [id number]  HPI Here to follow up on high BP. Her BP at the pharmacy over the past year has often been in the range of 140-170, although she feels well. It was 160/90 a few weeks ago in Dr. Anne Hahn' office.    Review of Systems  Constitutional: Negative.   Respiratory: Negative.   Cardiovascular: Negative.   Neurological: Negative.        Objective:   Physical Exam  Constitutional: She appears well-developed and well-nourished.  Cardiovascular: Normal rate, regular rhythm, normal heart sounds and intact distal pulses.   Pulmonary/Chest: Effort normal and breath sounds normal.          Assessment & Plan:  We will add Metoprolol to her HCTZ and recheck in 3-4 weeks.

## 2013-05-06 ENCOUNTER — Telehealth: Payer: Self-pay | Admitting: Family Medicine

## 2013-05-06 NOTE — Telephone Encounter (Signed)
I spoke with pt and gave the below advice, she just said okay.

## 2013-05-06 NOTE — Telephone Encounter (Signed)
[facility] Triage Call Report Triage Record Num: [id number] Operator: Lesli Albee Patient Name: [name] Call Date & Time: 05/03/2013 5:28:27PM Patient Phone: [phone] PCP: Patient Gender: Female PCP Fax : Patient DOB: Jan 28, 1966 [facility] Name: [facility] Reason for Call: Caller: [name]/Patient; PCP: Gershon Crane (Family Practice); CB#: [phone]; Call regarding Hallucinations; Pt is calling to say that the BP medication that was started on 04/16/13 - has caused her to be more depressed with auditory hallucinations. Pt hears voices that are telling her she is worthless and doesn't need to be here. Pt states she has a long term hx of this. She has not seen a psychologist in 3-4 years. She used to see Dr. Sharl Ma who had advised electric shock. Then she stopped seeing a behaviour therapist. Pt states 3-4 weeks ago she tried to kill herself with an overdose when the voices got to loud. Marland Kitchen Her husband now keeps her medication locked up. Pt states she needs Dr. Clent Ridges to add a antipsychotic and stop the BP med. Pt is not threatening to harm herself now. She states she has heard voices since childhood - they tell her she is worthless and doesn't need to be here - sometimes hours/day and that Dr. Clent Ridges and her neurologist manage this with her. RN checked EPIC and did not see clear documentation of the fact that MD is aware of this. RN called Dr. Selena Batten who advised ED. Pt cannot be seen at clinic tomorrow. MD advised she would send her to ED tomorrow also- that she would not be doing a behavioural health eval or prescribing an antipsychotic at an office visit. Pt advised to go to ED. She is refusing. She states that she will call next week for an appt with Dr. Clent Ridges. RN reiterated advice several times. Pt refused. Protocol(s) Used: Schizophrenia, Diagnosed Recommended Outcome per Protocol: See ED Immediately Reason for Outcome: Experiencing command hallucinations Care Advice: ~  IMMEDIATE ACTION 05/30

## 2013-05-06 NOTE — Telephone Encounter (Signed)
I agree that she needs to be seen ASAP by a psychiatrist. I do not feel that I can give her the care she needs. I do NOT prescribe antipsychotic medications. She needs to go to the ER or go to the intake desk at the [facility] immediately to be evaluated.

## 2013-05-26 ENCOUNTER — Other Ambulatory Visit: Payer: Self-pay | Admitting: Neurology

## 2013-06-08 ENCOUNTER — Other Ambulatory Visit: Payer: Self-pay | Admitting: Family Medicine

## 2013-06-12 NOTE — Telephone Encounter (Signed)
Call in #90 with 5 rf 

## 2013-07-08 ENCOUNTER — Other Ambulatory Visit: Payer: Self-pay | Admitting: Neurology

## 2013-07-08 NOTE — Telephone Encounter (Signed)
Rx signed and faxed.

## 2013-07-19 ENCOUNTER — Other Ambulatory Visit: Payer: Self-pay | Admitting: Neurology

## 2013-07-19 NOTE — Telephone Encounter (Signed)
Trazodone is not currently on med list.  Would you like to fill?  Please advise.  Thank you.

## 2013-08-19 ENCOUNTER — Other Ambulatory Visit: Payer: Self-pay | Admitting: Neurology

## 2013-08-19 NOTE — Telephone Encounter (Signed)
Dr Anne Hahn is out of the office.  Forwarding request to Dr Eulah Citizen

## 2013-08-20 NOTE — Telephone Encounter (Signed)
Rx signed and faxed.

## 2013-09-18 ENCOUNTER — Encounter (INDEPENDENT_AMBULATORY_CARE_PROVIDER_SITE_OTHER): Payer: Self-pay

## 2013-09-18 ENCOUNTER — Encounter: Payer: Self-pay | Admitting: Neurology

## 2013-09-18 ENCOUNTER — Ambulatory Visit (INDEPENDENT_AMBULATORY_CARE_PROVIDER_SITE_OTHER): Payer: Managed Care, Other (non HMO) | Admitting: Neurology

## 2013-09-18 VITALS — BP 151/88 | HR 78 | Wt 102.0 lb

## 2013-09-18 DIAGNOSIS — R51 Headache: Secondary | ICD-10-CM

## 2013-09-18 DIAGNOSIS — F329 Major depressive disorder, single episode, unspecified: Secondary | ICD-10-CM

## 2013-09-18 DIAGNOSIS — R569 Unspecified convulsions: Secondary | ICD-10-CM

## 2013-09-18 MED ORDER — PREGABALIN 50 MG PO CAPS: 50.0000 mg | ORAL_CAPSULE | Freq: Two times a day (BID) | ORAL | Status: DC

## 2013-09-18 MED ORDER — HYDROCODONE-ACETAMINOPHEN 5-325 MG PO TABS: 1.0000 | ORAL_TABLET | Freq: Four times a day (QID) | ORAL | Status: DC | PRN

## 2013-09-18 NOTE — Progress Notes (Signed)
Reason for visit: Seizures  KATYE VALEK is an 47 y.o. female  History of present illness:  Ms. Grand is a 47 year old right-handed white female with a history of seizures and headaches. The headaches remain frequent, coming up from the neck and shoulders into the back of the head. The patient has been placed on Lyrica, and she has tolerated a 50 mg dose at night. The patient has not gained benefit with the headaches, but she has not had any further seizures. The patient remains on Zonegran. The patient has been able to gain some weight on Lyrica, and she indicates that her OCD tendencies are less prominent on Lyrica. The patient has been placed on or thiazide and metoprolol for her blood pressure, but she could not tolerate these medications. The patient returns to this office for further evaluation.  Past Medical History  Diagnosis Date  . Seizures     sees Dr. Anne Hahn  . Anxiety   . Depression   . Insomnia   . Headache(784.0)     migraine  . Hypertension   . Suicidal ideation   . Obsessive compulsive disorder     Past Surgical History  Procedure Laterality Date  . Abdominal hysterectomy      2001  . Cholecystectomy    . Breast surgery  01/2007    lumpectomy...left breast. Dr. Abbey Chatters  . Oophorectomy    . Tonsillectomy    . Cesarean section    . Appendectomy      Family History  Problem Relation Age of Onset  . Cirrhosis Mother   . Heart disease Father   . Hypertension Father   . Thyroid cancer Brother     Social history:  reports that she has never smoked. She has never used smokeless tobacco. She reports that she does not drink alcohol or use illicit drugs.    Allergies  Allergen Reactions  . Vimpat [Lacosamide]     Increased depression    Medications:  Current Outpatient Prescriptions on File Prior to Visit  Medication Sig Dispense Refill  . alprazolam (XANAX) 2 MG tablet TAKE 1 TABLET BY MOUTH 3 TIMES A DAY AS NEEDED  90 tablet  5  .  butalbital-acetaminophen-caffeine (FIORICET WITH CODEINE) 50-325-40-30 MG per capsule Take 1 capsule by mouth every 6 (six) hours as needed for headache.      Marland Kitchen PREMARIN 0.625 MG tablet TAKE 1 TABLET (0.625 MG TOTAL) BY MOUTH DAILY. TAKE DAILY FOR 21 DAYS THEN DO NOT TAKE FOR 7 DAYS.  30 tablet  10  . temazepam (RESTORIL) 30 MG capsule Take 2 capsules (60 mg total) by mouth at bedtime as needed for sleep.  60 capsule  5  . traZODone (DESYREL) 100 MG tablet TAKE 1 TABLET BY MOUTH AT NIGHT AS NEEDED  30 tablet  1  . zonisamide (ZONEGRAN) 100 MG capsule TAKE 3 CAPSULES BY MOUTH TWICE A DAY  180 capsule  5   No current facility-administered medications on file prior to visit.    ROS:  Out of a complete 14 system review of symptoms, the patient complains only of the following symptoms, and all other reviewed systems are negative.  Headache Insomnia Depression  Blood pressure 151/88, pulse 78, weight 102 lb (46.267 kg).  Physical Exam  General: The patient is alert and cooperative at the time of the examination.  Skin: No significant peripheral edema is noted.   Neurologic Exam  Cranial nerves: Facial symmetry is present. Speech is normal, no aphasia or  dysarthria is noted. Extraocular movements are full. Visual fields are full.  Motor: The patient has good strength in all 4 extremities.  Coordination: The patient has good finger-nose-finger and heel-to-shin bilaterally.  Gait and station: The patient has a normal gait. Tandem gait is normal. Romberg is negative. No drift is seen.  Reflexes: Deep tendon reflexes are symmetric.   Assessment/Plan:  One. History seizures  2. Cervicogenic headache  3. History depression  4. Obsessive-compulsive disorder  The patient is to go up on Lyrica taking 50 mg twice daily. The patient will remain on Zonegran. A prescription was given for the hydrocodone. The patient indicates that her husband keeps the medications, and controls the  medications completely. The patient does not have direct access to her medications secondary to her history of depression and suicidal ideation. The patient will followup through this office in 6 months.  Marlan Palau MD 09/18/2013 7:25 PM  [facility] [address] [place], [place] [address]  Phone [phone] Fax [phone]

## 2013-09-23 ENCOUNTER — Ambulatory Visit (INDEPENDENT_AMBULATORY_CARE_PROVIDER_SITE_OTHER): Payer: Managed Care, Other (non HMO) | Admitting: Family Medicine

## 2013-09-23 ENCOUNTER — Encounter: Payer: Self-pay | Admitting: Family Medicine

## 2013-09-23 VITALS — BP 124/70 | HR 83 | Temp 98.2°F | Wt 104.0 lb

## 2013-09-23 DIAGNOSIS — K5900 Constipation, unspecified: Secondary | ICD-10-CM

## 2013-09-23 DIAGNOSIS — K649 Unspecified hemorrhoids: Secondary | ICD-10-CM

## 2013-09-23 MED ORDER — TEMAZEPAM 30 MG PO CAPS: 60.0000 mg | ORAL_CAPSULE | Freq: Every evening | ORAL | Status: DC | PRN

## 2013-09-23 NOTE — Progress Notes (Signed)
  Subjective:    Patient ID: [name], female    DOB: 1966/01/11, 47 y.o.   MRN: [id number]  HPI Here for bleeding hemorrhoids. She has had these for several years but now wants to take care of them. She is chronically constipated and averages 2 BMs a week. She has no abdominal pain.    Review of Systems  Constitutional: Negative.   Gastrointestinal: Positive for constipation, blood in stool, anal bleeding and rectal pain. Negative for nausea, abdominal pain, diarrhea and abdominal distention.       Objective:   Physical Exam  Constitutional: She appears well-developed and well-nourished.  Abdominal: Soft. Bowel sounds are normal. She exhibits no distension and no mass. There is no tenderness. There is no rebound and no guarding.  Genitourinary:  Several small hemorrhoids at the anal verge           Assessment & Plan:  Advised her to try Miralax daily to regulate her BMs. Refer to GI to treat the hemorrhoids

## 2013-09-24 ENCOUNTER — Encounter: Payer: Self-pay | Admitting: Internal Medicine

## 2013-09-25 ENCOUNTER — Encounter: Payer: Self-pay | Admitting: *Deleted

## 2013-10-24 ENCOUNTER — Other Ambulatory Visit: Payer: Self-pay

## 2013-10-24 MED ORDER — HYDROCODONE-ACETAMINOPHEN 5-325 MG PO TABS: 1.0000 | ORAL_TABLET | Freq: Four times a day (QID) | ORAL | Status: DC | PRN

## 2013-10-24 NOTE — Telephone Encounter (Signed)
Patient called requesting a refill on Hydrocodone.  Please call her at [phone] when the Rx is ready.

## 2013-10-25 ENCOUNTER — Telehealth: Payer: Self-pay | Admitting: Neurology

## 2013-10-25 NOTE — Telephone Encounter (Signed)
Called patient to inform her that her prescription was ready to be picked up, patient verbalized understanding and stated that she would be picking it up at the front desk.

## 2013-11-06 ENCOUNTER — Telehealth: Payer: Self-pay | Admitting: Family Medicine

## 2013-11-06 MED ORDER — AZITHROMYCIN 250 MG PO TABS: ORAL_TABLET | ORAL | Status: DC

## 2013-11-06 NOTE — Telephone Encounter (Signed)
I sent script e-scribe and spoke with pt. 

## 2013-11-06 NOTE — Telephone Encounter (Signed)
Patient Information:  Caller Name: Kenyia  Phone: [phone]  Patient: , Dalma  Gender: Female  DOB: November 07, 1966  Age: 47 Years  PCP: Gershon Crane [facility])  Pregnant: No  Office Follow Up:  Does the office need to follow up with this patient?: Yes  Instructions For The Office: Please call and advise  RN Note:  [facility]  Symptoms  Reason For Call & Symptoms: Pt is calling to see if MD will call in a z-pac. RN advised an appt. Pt tells RN that due to her epilepsy she has a hard time getting a ride to the office. Pt started with a fever of 103.9 last week with congestion/cough/sore throat. She is afebrile as of yesterday but has a productive green cough.  Reviewed Health History In EMR: Yes  Reviewed Medications In EMR: Yes  Reviewed Allergies In EMR: Yes  Reviewed Surgeries / Procedures: Yes  Date of Onset of Symptoms: 10/29/2013 OB / GYN:  LMP: Unknown  Guideline(s) Used:  Cough  Disposition Per Guideline:   Home Care  Reason For Disposition Reached:   Cough with no complications  Advice Given:  N/A  Patient Refused Recommendation:  Patient Requests Prescription  Pt is requesting a z-pac to help get over her cough

## 2013-11-06 NOTE — Telephone Encounter (Signed)
Call in a Zpack  ?

## 2013-11-21 ENCOUNTER — Other Ambulatory Visit: Payer: Self-pay | Admitting: Neurology

## 2013-11-21 MED ORDER — HYDROCODONE-ACETAMINOPHEN 5-325 MG PO TABS: 1.0000 | ORAL_TABLET | Freq: Four times a day (QID) | ORAL | Status: DC | PRN

## 2013-11-21 NOTE — Telephone Encounter (Signed)
Patient called requesting refill of hydrocodone. Patient only has enough to last for this week. Please call.

## 2013-11-22 NOTE — Telephone Encounter (Signed)
I called and left VM that Rx will be available at front desk for pick up today after noon.

## 2013-11-26 ENCOUNTER — Ambulatory Visit: Payer: Managed Care, Other (non HMO) | Admitting: Internal Medicine

## 2013-11-27 ENCOUNTER — Other Ambulatory Visit: Payer: Self-pay | Admitting: Family Medicine

## 2013-12-02 NOTE — Telephone Encounter (Signed)
Call in #90 with 5 rf 

## 2013-12-05 ENCOUNTER — Other Ambulatory Visit: Payer: Self-pay | Admitting: Family Medicine

## 2013-12-05 HISTORY — PX: HEMORRHOID BANDING: SHX5850

## 2013-12-09 NOTE — Telephone Encounter (Signed)
NO she is not due until 03-24-14

## 2013-12-11 ENCOUNTER — Encounter: Payer: Self-pay | Admitting: Internal Medicine

## 2013-12-20 ENCOUNTER — Other Ambulatory Visit: Payer: Self-pay | Admitting: Neurology

## 2013-12-20 MED ORDER — HYDROCODONE-ACETAMINOPHEN 5-325 MG PO TABS: 1.0000 | ORAL_TABLET | Freq: Four times a day (QID) | ORAL | Status: DC | PRN

## 2013-12-20 NOTE — Telephone Encounter (Signed)
Needs her RX for hydrocodone.

## 2013-12-23 ENCOUNTER — Other Ambulatory Visit: Payer: Self-pay

## 2013-12-23 NOTE — Telephone Encounter (Signed)
I called patient and informed her that Rx would be available at front desk after noon today for pick-up.

## 2014-01-10 ENCOUNTER — Ambulatory Visit (INDEPENDENT_AMBULATORY_CARE_PROVIDER_SITE_OTHER): Payer: Managed Care, Other (non HMO) | Admitting: Internal Medicine

## 2014-01-10 ENCOUNTER — Encounter: Payer: Self-pay | Admitting: Internal Medicine

## 2014-01-10 VITALS — BP 112/70 | HR 76 | Ht 61.5 in | Wt 107.1 lb

## 2014-01-10 DIAGNOSIS — K5909 Other constipation: Secondary | ICD-10-CM

## 2014-01-10 DIAGNOSIS — K5900 Constipation, unspecified: Secondary | ICD-10-CM

## 2014-01-10 DIAGNOSIS — K648 Other hemorrhoids: Secondary | ICD-10-CM | POA: Insufficient documentation

## 2014-01-10 HISTORY — DX: Other constipation: K59.09

## 2014-01-10 NOTE — Patient Instructions (Addendum)
HEMORRHOID BANDING PROCEDURE    FOLLOW-UP CARE   1. The procedure you have had should have been relatively painless since the banding of the area involved does not have nerve endings and there is no pain sensation.  The rubber band cuts off the blood supply to the hemorrhoid and the band may fall off as soon as 48 hours after the banding (the band may occasionally be seen in the toilet bowl following a bowel movement). You may notice a temporary feeling of fullness in the rectum which should respond adequately to plain Tylenol or Motrin.  2. Following the banding, avoid strenuous exercise that evening and resume full activity the next day.  A sitz bath (soaking in a warm tub) or bidet is soothing, and can be useful for cleansing the area after bowel movements.     3. To avoid constipation, take two tablespoons of natural wheat bran, natural oat bran, flax, Benefiber or any over the counter fiber supplement and increase your water intake to 7-8 glasses daily.    4. Unless you have been prescribed anorectal medication, do not put anything inside your rectum for two weeks: No suppositories, enemas, fingers, etc.  5. Occasionally, you may have more bleeding than usual after the banding procedure.  This is often from the untreated hemorrhoids rather than the treated one.  Don't be concerned if there is a tablespoon or so of blood.  If there is more blood than this, lie flat with your bottom higher than your head and apply an ice pack to the area. If the bleeding does not stop within a half an hour or if you feel faint, call our office at [phone] or go to the emergency room.  6. Problems are not common; however, if there is a substantial amount of bleeding, severe pain, chills, fever or difficulty passing urine (very rare) or other problems, you should call us at [phone] or report to the nearest emergency room.  7. Do not stay seated continuously for more than 2-3 hours for a day or two  after the procedure.  Tighten your buttock muscles 10-15 times every two hours and take 10-15 deep breaths every 1-2 hours.  Do not spend more than a few minutes on the toilet if you cannot empty your bowel; instead re-visit the toilet at a later time.    Today we are giving you a handout to read on Fiber.  Work your way up to 2-3 tablespoons a day.   We will see you at your next appointment 01/29/14 at 2:15pm.   I appreciate the opportunity to care for you.

## 2014-01-10 NOTE — Progress Notes (Signed)
Subjective:  Referred by: Nelwyn SalisburyFry, Stephen A, MD   Patient ID: Shannon RevealMelinda S [name], female    DOB: Dec 21, 1965, 48 y.o.   MRN: [id number]  HPI Is a very nice lady here with her mom today, with complaints of a 1 year history of intermittent rectal bleeding. She reports that she was having red blood on the toilet paper off and on for 6 months, assisted with straining to stool and hard stools at times. She moves her bowels about twice a week. She has tried Clinical biochemistMiraLax and other laxatives and they have not really agreed with her. She has started fish oil and that has reduced straining to stool. She denies long periods of time sitting on the toilet. Dr. Clent RidgesFry has evaluated her and diagnosed her with bleeding hemorrhoids. For the past 6 months she's had more frequent problems saying that she sees blood on the tissue paper, every time she wipes and intermittent blood dropping into the commode. He recalls first problems with hemorrhoids during pregnancy and birth of her first child. Allergies  Allergen Reactions  . Vimpat [Lacosamide]     Increased depression   Outpatient Prescriptions Prior to Visit  Medication Sig Dispense Refill  . alprazolam (XANAX) 2 MG tablet TAKE 1 TABLET THREE TIMES A DAY AS NEEDED  90 tablet  5  . HYDROcodone-acetaminophen (NORCO/VICODIN) 5-325 MG per tablet Take 1 tablet by mouth every 6 (six) hours as needed.  75 tablet  0  . pregabalin (LYRICA) 50 MG capsule Take 1 capsule (50 mg total) by mouth 2 (two) times daily.  60 capsule  5  . PREMARIN 0.625 MG tablet TAKE 1 TABLET (0.625 MG TOTAL) BY MOUTH DAILY. TAKE DAILY FOR 21 DAYS THEN DO NOT TAKE FOR 7 DAYS.  30 tablet  10  . temazepam (RESTORIL) 30 MG capsule Take 2 capsules (60 mg total) by mouth at bedtime as needed for sleep.  60 capsule  5  . zonisamide (ZONEGRAN) 100 MG capsule TAKE 3 CAPSULES BY MOUTH TWICE A DAY  180 capsule  5  . azithromycin (ZITHROMAX Z-PAK) 250 MG tablet Take as directed  6 each  0   No  facility-administered medications prior to visit.   Past Medical History  Diagnosis Date  . Epilepsy     seizures, sees Dr. Anne HahnWillis  . Anxiety   . Depression   . Insomnia   . Headache(784.0)     migraine  . Hypertension   . Suicidal ideation   . Obsessive compulsive disorder   . Arthritis   . Gallstones   . Hemorrhoids    Past Surgical History  Procedure Laterality Date  . Abdominal hysterectomy  2001  . Cholecystectomy    . Breast lumpectomy Left 01/2007    Dr. Abbey Chattersosenbower  . Tonsillectomy    . Cesarean section    . Appendectomy Right     with ovarian cyst removal   History   Social History  . Marital Status: Married    Spouse Name: N/A    Number of Children: 2  .     Occupational History  . homemaker    Social History Main Topics  . Smoking status: Never Smoker   . Smokeless tobacco: Never Used  . Alcohol Use: Yes     Comment: rarely  . Drug Use: No              Social History Narrative   Married 2 sons homemaker   4-5 caffeinated beverages daily  Family History  Problem Relation Age of Onset  . Cirrhosis Mother   . Heart disease Father   . Hypertension Father   . Thyroid cancer Brother   . HIV Brother   . Pancreatic cancer Maternal Uncle   . Stomach cancer Mother   . Heart disease Mother         Review of Systems Positive for back pain fatigue and headaches and insomnia. All other review of systems negative or as per history of present illness.    Objective:   Physical Exam General:  Well-developed, well-nourished and in no acute distress Eyes:  anicteric. Lungs: Clear to auscultation bilaterally. Heart:  S1S2, no rubs, murmurs, gallops. Abdomen:  soft, non-tender, no hepatosplenomegaly, hernia, or mass and BS+.  Extremities:   no edema Psych:  appropriate mood and  Affect.  Rectal:  Female staff present  Anoderm inspection revealed anterior and posterior tags Digital exam revealed normal resting tone and voluntary squeeze. No  mass or rectocele present. Simulated defecation with valsalva revealed appropriate abdominal contraction and descent.    Anoscopy was performed with the patient in the left lateral decubitus position while a chaperone was present and revealed Grade 1-2 internal hemorrhoids LL most prominent  PROCEDURE NOTE: The patient presents with symptomatic grade 2  hemorrhoids, requesting rubber band ligation of his/her hemorrhoidal disease.  All risks, benefits and alternative forms of therapy were described and informed consent was obtained.  The decision was made to band the LL internal hemorrhoid, and the [facility] O'Regan System was used to perform band ligation without complication.  Digital anorectal examination was then performed to assure proper positioning of the band, and to adjust the banded tissue as required.  The patient was discharged home without pain or other issues.  Dietary and behavioral recommendations were given and along with follow-up instructions.     The following adjunctive treatments were recommended:  Benefiber titrate to 3 tablespoons/day  The patient will return in 2-3 weeks for  follow-up and possible additional banding as required. No complications were encountered and the patient tolerated the procedure well.     Assessment & Plan:   1. Hemorrhoids, internal, with bleeding and Grad 2 prolapse

## 2014-01-10 NOTE — Assessment & Plan Note (Signed)
Left lateral columns banded today. Benefiber advised to relieve constipation problems. He will return in a few weeks for reassessment. I'm fairly confident that this is anorectal bleeding from hemorrhoids but is the treatment does not cause resolution would proceed to colonoscopy sooner than age 89. I explained this to her today.

## 2014-01-10 NOTE — Assessment & Plan Note (Signed)
Add Benefiber titrate up to 3 tablespoons daily. She cannot tolerate the taste of MiraLax. Note that anorectal function seems appropriate an intact based upon rectal exam today.

## 2014-01-15 ENCOUNTER — Other Ambulatory Visit: Payer: Self-pay | Admitting: Neurology

## 2014-01-15 MED ORDER — HYDROCODONE-ACETAMINOPHEN 5-325 MG PO TABS: 1.0000 | ORAL_TABLET | Freq: Four times a day (QID) | ORAL | Status: DC | PRN

## 2014-01-15 NOTE — Telephone Encounter (Signed)
Last written 01/16.  This is the 27th day

## 2014-01-15 NOTE — Telephone Encounter (Signed)
Called patient and left message informing her that her Rx was ready to be picked up at the front desk and if she has any other problems, questions or concerns to call the office. °

## 2014-01-29 ENCOUNTER — Ambulatory Visit (INDEPENDENT_AMBULATORY_CARE_PROVIDER_SITE_OTHER): Payer: Managed Care, Other (non HMO) | Admitting: Internal Medicine

## 2014-01-29 VITALS — BP 100/60 | HR 60 | Ht 61.5 in | Wt 107.6 lb

## 2014-01-29 DIAGNOSIS — K648 Other hemorrhoids: Secondary | ICD-10-CM

## 2014-01-29 NOTE — Progress Notes (Signed)
Patient ID: Blondell RevealMelinda S Tieu, female   DOB: 22-Jul-1966, 48 y.o.   MRN: [id number]         She reports less bleeding since initial hemorrhoid banding about 2 weeks ago (LL column)  PROCEDURE NOTE: The patient presents with symptomatic grade   hemorrhoids, requesting rubber band ligation of his/her hemorrhoidal disease.  All risks, benefits and alternative forms of therapy were described and informed consent was obtained.  Rectal exam and procedure performed with female staff present.  Small Llat/post tag noted, no mass  The decision was made to band the RP and RA internal hemorrhoids, and the [facility] O'Regan System was used to perform band ligation without complication.  Digital anorectal examination was then performed to assure proper positioning of the band, and to adjust the banded tissue as required.  The patient was discharged home without pain or other issues.  Dietary and behavioral recommendations were given and along with follow-up instructions.     The following adjunctive treatments were recommended:  Continue Benefiber  The patient will return in 6-8 weeks for  follow-up and possible additional banding as required. No complications were encountered and the patient tolerated the procedure well.

## 2014-01-29 NOTE — Patient Instructions (Addendum)
HEMORRHOID BANDING PROCEDURE    FOLLOW-UP CARE   1. The procedure you have had should have been relatively painless since the banding of the area involved does not have nerve endings and there is no pain sensation.  The rubber band cuts off the blood supply to the hemorrhoid and the band may fall off as soon as 48 hours after the banding (the band may occasionally be seen in the toilet bowl following a bowel movement). You may notice a temporary feeling of fullness in the rectum which should respond adequately to plain Tylenol or Motrin.  2. Following the banding, avoid strenuous exercise that evening and resume full activity the next day.  A sitz bath (soaking in a warm tub) or bidet is soothing, and can be useful for cleansing the area after bowel movements.     3. To avoid constipation, take two tablespoons of natural wheat bran, natural oat bran, flax, Benefiber or any over the counter fiber supplement and increase your water intake to 7-8 glasses daily.    4. Unless you have been prescribed anorectal medication, do not put anything inside your rectum for two weeks: No suppositories, enemas, fingers, etc.  5. Occasionally, you may have more bleeding than usual after the banding procedure.  This is often from the untreated hemorrhoids rather than the treated one.  Don't be concerned if there is a tablespoon or so of blood.  If there is more blood than this, lie flat with your bottom higher than your head and apply an ice pack to the area. If the bleeding does not stop within a half an hour or if you feel faint, call our office at [phone] or go to the emergency room.  6. Problems are not common; however, if there is a substantial amount of bleeding, severe pain, chills, fever or difficulty passing urine (very rare) or other problems, you should call us at [phone] or report to the nearest emergency room.  7. Do not stay seated continuously for more than 2-3 hours for a day or two  after the procedure.  Tighten your buttock muscles 10-15 times every two hours and take 10-15 deep breaths every 1-2 hours.  Do not spend more than a few minutes on the toilet if you cannot empty your bowel; instead re-visit the toilet at a later time.    Follow up with us in 6 - 8 weeks.  Continue Benefiber.   I appreciate the opportunity to care for you.

## 2014-01-29 NOTE — Assessment & Plan Note (Addendum)
RP and RA banded Continue benefiber RTC 6-8 weeks

## 2014-02-05 ENCOUNTER — Telehealth: Payer: Self-pay | Admitting: Neurology

## 2014-02-05 NOTE — Telephone Encounter (Signed)
Rx was last written on 02/11.  It is a bit too soon.  I called the patient back.  She said she still has medication and will wait until next week to get refill.

## 2014-02-05 NOTE — Telephone Encounter (Signed)
Pt called needs her written prescription HYDROcodone-acetaminophen (NORCO/VICODIN) 5-325 MG per tablet and call pt back when ready for pick up

## 2014-02-10 ENCOUNTER — Other Ambulatory Visit: Payer: Self-pay

## 2014-02-10 ENCOUNTER — Other Ambulatory Visit: Payer: Self-pay | Admitting: Family Medicine

## 2014-02-10 MED ORDER — HYDROCODONE-ACETAMINOPHEN 5-325 MG PO TABS: 1.0000 | ORAL_TABLET | Freq: Four times a day (QID) | ORAL | Status: DC | PRN

## 2014-02-10 NOTE — Telephone Encounter (Signed)
Patient was called and advised that her rx was ready for pick-up at the front desk.  Patient was advised to call the office with any questions.

## 2014-03-10 ENCOUNTER — Other Ambulatory Visit: Payer: Self-pay | Admitting: Neurology

## 2014-03-10 MED ORDER — HYDROCODONE-ACETAMINOPHEN 5-325 MG PO TABS: 1.0000 | ORAL_TABLET | Freq: Four times a day (QID) | ORAL | Status: DC | PRN

## 2014-03-10 NOTE — Telephone Encounter (Signed)
Pt called to get her HYDROcodone-acetaminophen (NORCO/VICODIN) 5-325 MG per tablet refilled.  Please call when the prescription is ready for pick up.  Thank you

## 2014-03-10 NOTE — Telephone Encounter (Signed)
Called pt to inform her that her Rx was ready to be picked up at the front desk and if she has any other problems, questions or concerns to call the office. Pt verbalized understanding. °

## 2014-03-24 ENCOUNTER — Encounter: Payer: Self-pay | Admitting: Internal Medicine

## 2014-03-24 ENCOUNTER — Ambulatory Visit (INDEPENDENT_AMBULATORY_CARE_PROVIDER_SITE_OTHER): Payer: Managed Care, Other (non HMO) | Admitting: Internal Medicine

## 2014-03-24 VITALS — BP 130/86 | HR 99 | Ht 61.5 in | Wt 106.0 lb

## 2014-03-24 DIAGNOSIS — K648 Other hemorrhoids: Secondary | ICD-10-CM

## 2014-03-24 NOTE — Progress Notes (Signed)
         Subjective:    Patient ID: [name], female    DOB: [birth year], 48 y.o.   MRN: [id number]  HPI F/u after hemorrhoid banding. No sxs now Medications, allergies, past medical history, past surgical history, family history and social history are reviewed and updated in the EMR.    Review of Systems As above    Objective:   Physical Exam WDWN NAD    Assessment & Plan:  Hemorrhoids, internal, with bleeding and Grad 2 prolapse Doing well - f/u PRN

## 2014-03-24 NOTE — Patient Instructions (Signed)
    Follow up with Dr. Gessner as needed.    I appreciate the opportunity to care for you.  

## 2014-03-24 NOTE — Assessment & Plan Note (Signed)
Doing well - f/u PRN

## 2014-03-27 ENCOUNTER — Encounter: Payer: Self-pay | Admitting: Neurology

## 2014-03-27 ENCOUNTER — Ambulatory Visit (INDEPENDENT_AMBULATORY_CARE_PROVIDER_SITE_OTHER): Payer: Managed Care, Other (non HMO) | Admitting: Neurology

## 2014-03-27 VITALS — BP 123/81 | HR 64 | Wt 106.0 lb

## 2014-03-27 DIAGNOSIS — R569 Unspecified convulsions: Secondary | ICD-10-CM

## 2014-03-27 DIAGNOSIS — R51 Headache: Secondary | ICD-10-CM

## 2014-03-27 DIAGNOSIS — S161XXA Strain of muscle, fascia and tendon at neck level, initial encounter: Secondary | ICD-10-CM

## 2014-03-27 MED ORDER — PREGABALIN 75 MG PO CAPS: 75.0000 mg | ORAL_CAPSULE | Freq: Two times a day (BID) | ORAL | Status: DC

## 2014-03-27 MED ORDER — ZONISAMIDE 100 MG PO CAPS: 300.0000 mg | ORAL_CAPSULE | Freq: Two times a day (BID) | ORAL | Status: DC

## 2014-03-27 NOTE — Progress Notes (Signed)
Reason for visit: Headaches, seizures  Blondell RevealMelinda S Tirone is an 48 y.o. female  History of present illness:  Ms. Montel ClockLechner is a 48 year old right-handed white female with a history of seizures under good control. The patient is on Zonegran and Lyrica. The patient has gained improvement with the headaches on Lyrica, currently taking 50 mg twice daily. The patient is noting some word finding problems on this medication. The patient overall feels better, and when she does get her headache, they are less severe. No further seizures are noted. The patient returns to this office for further evaluation. The patient has been able to gain about 4 pounds on the Lyrica since last seen.  Past Medical History  Diagnosis Date  . Epilepsy     seizures, sees Dr. Anne HahnWillis  . Anxiety   . Depression   . Insomnia   . Headache(784.0)     migraine  . Hypertension   . Suicidal ideation   . Obsessive compulsive disorder   . Arthritis   . Gallstones   . Hemorrhoids   . Chronic constipation 01/10/2014    Past Surgical History  Procedure Laterality Date  . Abdominal hysterectomy  2001  . Cholecystectomy    . Breast lumpectomy Left 01/2007    Dr. Abbey Chattersosenbower  . Tonsillectomy    . Cesarean section    . Appendectomy Right     with ovarian cyst removal  . Hemorrhoid banding  2015    Family History  Problem Relation Age of Onset  . Cirrhosis Mother   . Heart disease Father   . Hypertension Father   . Thyroid cancer Brother   . HIV Brother   . Pancreatic cancer Maternal Uncle   . Stomach cancer Mother   . Heart disease Mother     Social history:  reports that she has never smoked. She has never used smokeless tobacco. She reports that she drinks alcohol. She reports that she does not use illicit drugs.    Allergies  Allergen Reactions  . Vimpat [Lacosamide]     Increased depression    Medications:  Current Outpatient Prescriptions on File Prior to Visit  Medication Sig Dispense Refill  .  alprazolam (XANAX) 2 MG tablet TAKE 1 TABLET THREE TIMES A DAY AS NEEDED  90 tablet  5  . HYDROcodone-acetaminophen (NORCO/VICODIN) 5-325 MG per tablet Take 1 tablet by mouth every 6 (six) hours as needed.  75 tablet  0  . PREMARIN 0.625 MG tablet TAKE 1 TABLET DAILY FOR 21 DAYS THEN DO NOT TAKE FOR 7 DAYS.  30 tablet  10  . temazepam (RESTORIL) 30 MG capsule Take 2 capsules (60 mg total) by mouth at bedtime as needed for sleep.  60 capsule  5   No current facility-administered medications on file prior to visit.    ROS:  Out of a complete 14 system review of symptoms, the patient complains only of the following symptoms, and all other reviewed systems are negative.  Blurred vision Headache  History of seizures  Blood pressure 123/81, pulse 64, weight 106 lb (48.081 kg).  Physical Exam  General: The patient is alert and cooperative at the time of the examination.  Skin: No significant peripheral edema is noted.   Neurologic Exam  Mental status: The patient is oriented x 3.  Cranial nerves: Facial symmetry is present. Speech is normal, no aphasia or dysarthria is noted. Extraocular movements are full. Visual fields are full.  Motor: The patient has good strength in  all 4 extremities.  Sensory examination: Soft touch sensation is symmetric on the face, arms, and legs.  Coordination: The patient has good finger-nose-finger and heel-to-shin bilaterally.  Gait and station: The patient has a normal gait. Tandem gait is normal. Romberg is negative. No drift is seen.  Reflexes: Deep tendon reflexes are symmetric.   Assessment/Plan:  1. Headache  2. Seizures  3. Depression  The patient is doing relatively well at this point. The Lyrica will be increased taking 10 mg twice daily. The patient will continue on the Zonegran. The patient will followup through this office in 6 months. The patient has noted that the Lyrica has reduce the need for hydrocodone.  Marlan Palau. Keith Willis  MD 03/27/2014 7:37 PM  [facility] [address] [place], [place] [id number]  Phone [phone] Fax [phone]

## 2014-03-27 NOTE — Patient Instructions (Signed)
Epilepsy Epilepsy is a disorder in which a person has repeated seizures over time. A seizure is a release of abnormal electrical activity in the brain. Seizures can cause a change in attention, behavior, or the ability to remain awake and alert (altered mental status). Seizures often involve uncontrollable shaking (convulsions).  Most people with epilepsy lead normal lives. However, people with epilepsy are at an increased risk of falls, accidents, and injuries. Therefore, it is important to begin treatment right away. CAUSES  Epilepsy has many possible causes. Anything that disturbs the normal pattern of brain cell activity can lead to seizures. This may include:   Head injury.  Birth trauma.  High fever as a child.  Stroke.  Bleeding into or around the brain.  Certain drugs.  Prolonged low oxygen, such as what occurs after CPR efforts.  Abnormal brain development.  Certain illnesses, such as meningitis, encephalitis (brain infection), malaria, and other infections.  An imbalance of nerve signaling chemicals (neurotransmitters).  SIGNS AND SYMPTOMS  The symptoms of a seizure can vary greatly from one person to another. Right before a seizure, you may have a warning (aura) that a seizure is about to occur. An aura may include the following symptoms:  Fear or anxiety.  Nausea.  Feeling like the room is spinning (vertigo).  Vision changes, such as seeing flashing lights or spots. Common symptoms during a seizure include:  Abnormal sensations, such as an abnormal smell or a bitter taste in the mouth.   Sudden, general body stiffness.   Convulsions that involve rhythmic jerking of the face, arm, or leg on one or both sides.   Sudden change in consciousness.   Appearing to be awake but not responding.   Appearing to be asleep but cannot be awakened.   Grimacing, chewing, lip smacking, drooling, tongue biting, or loss of bowel or bladder control. After a seizure,  you may feel sleepy for a while. DIAGNOSIS  Your health care provider will ask about your symptoms and take a medical history. Descriptions from any witnesses to your seizures will be very helpful in the diagnosis. A physical exam, including a detailed neurological exam, is necessary. Various tests may be done, such as:   An electroencephalogram (EEG). This is a painless test of your brain waves. In this test, a diagram is created of your brain waves. These diagrams can be interpreted by a specialist.  An MRI of the brain.   A CT scan of the brain.   A spinal tap (lumbar puncture, LP).  Blood tests to check for signs of infection or abnormal blood chemistry. TREATMENT  There is no cure for epilepsy, but it is generally treatable. Once epilepsy is diagnosed, it is important to begin treatment as soon as possible. For most people with epilepsy, seizures can be controlled with medicines. The following may also be used:  A pacemaker for the brain (vagus nerve stimulator) can be used for people with seizures that are not well controlled by medicine.  Surgery on the brain. For some people, epilepsy eventually goes away. HOME CARE INSTRUCTIONS   Follow your health care provider's recommendations on driving and safety in normal activities.  Get enough rest. Lack of sleep can cause seizures.  Only take over-the-counter or prescription medicines as directed by your health care provider. Take any prescribed medicine exactly as directed.  Avoid any known triggers of your seizures.  Keep a seizure diary. Record what you recall about any seizure, especially any possible trigger.   Make   sure the people you live and work with know that you are prone to seizures. They should receive instructions on how to help you. In general, a witness to a seizure should:   Cushion your head and body.   Turn you on your side.   Avoid unnecessarily restraining you.   Not place anything inside your  mouth.   Call for emergency medical help if there is any question about what has occurred.   Follow up with your health care provider as directed. You may need regular blood tests to monitor the levels of your medicine.  SEEK MEDICAL CARE IF:   You develop signs of infection or other illness. This might increase the risk of a seizure.   You seem to be having more frequent seizures.   Your seizure pattern is changing.  SEEK IMMEDIATE MEDICAL CARE IF:   You have a seizure that does not stop after a few moments.   You have a seizure that causes any difficulty in breathing.   You have a seizure that results in a very severe headache.   You have a seizure that leaves you with the inability to speak or use a part of your body.  Document Released: 11/21/2005 Document Revised: 09/11/2013 Document Reviewed: 07/03/2013 [employer] Patient Information 2014 [employer].  

## 2014-03-30 ENCOUNTER — Other Ambulatory Visit: Payer: Self-pay | Admitting: Family Medicine

## 2014-03-31 NOTE — Telephone Encounter (Signed)
Call in #[phone] with 5 rf 

## 2014-04-09 ENCOUNTER — Other Ambulatory Visit: Payer: Self-pay | Admitting: Neurology

## 2014-04-09 MED ORDER — HYDROCODONE-ACETAMINOPHEN 5-325 MG PO TABS: 1.0000 | ORAL_TABLET | Freq: Four times a day (QID) | ORAL | Status: DC | PRN

## 2014-04-09 NOTE — Telephone Encounter (Signed)
Patient was left a message that her prescription is ready for pickup at the front desk.

## 2014-04-09 NOTE — Telephone Encounter (Signed)
Pt called requesting written refill on HYDROcodone-acetaminophen (NORCO/VICODIN) 5-325 MG per tablet, please call pt when ready for pick up. Thanks

## 2014-04-09 NOTE — Telephone Encounter (Signed)
Request forwarded to provider for approval  

## 2014-05-08 ENCOUNTER — Other Ambulatory Visit: Payer: Self-pay | Admitting: Neurology

## 2014-05-08 MED ORDER — HYDROCODONE-ACETAMINOPHEN 5-325 MG PO TABS: 1.0000 | ORAL_TABLET | Freq: Four times a day (QID) | ORAL | Status: DC | PRN

## 2014-05-08 NOTE — Telephone Encounter (Signed)
Request forwarded to provider for approval  

## 2014-05-08 NOTE — Telephone Encounter (Signed)
Patient requesting refill for HYDROcodone-acetaminophen (NORCO/VICODIN) 5-325 MG per tablet.

## 2014-05-09 NOTE — Telephone Encounter (Signed)
Called pt and left message informing pt that her Rx was ready to be picked up at the front desk and if she has any other problems, questions or concerns to call the office. °

## 2014-05-28 ENCOUNTER — Other Ambulatory Visit: Payer: Self-pay | Admitting: Family Medicine

## 2014-05-29 NOTE — Telephone Encounter (Signed)
Needs contract signed and tox screen  Can refill x 1  Only  PCP NA  Further refills per dr fry

## 2014-06-05 ENCOUNTER — Other Ambulatory Visit: Payer: Self-pay | Admitting: Neurology

## 2014-06-05 MED ORDER — HYDROCODONE-ACETAMINOPHEN 5-325 MG PO TABS: 1.0000 | ORAL_TABLET | Freq: Four times a day (QID) | ORAL | Status: DC | PRN

## 2014-06-05 NOTE — Telephone Encounter (Signed)
Dr Willis is out of the office, forwarding request to [facility] for approval  

## 2014-06-05 NOTE — Telephone Encounter (Signed)
Patient requesting Rx refill for HYDROcodone-acetaminophen (NORCO/VICODIN) 5-325 MG per tablet.  Please call when ready for pick up. °

## 2014-06-05 NOTE — Telephone Encounter (Signed)
Called pt to inform her that her Rx was ready to be picked up at the front desk and if she has any other problems, questions or concerns to call the office. Pt verbalized understanding. °

## 2014-06-09 ENCOUNTER — Ambulatory Visit (INDEPENDENT_AMBULATORY_CARE_PROVIDER_SITE_OTHER): Payer: Managed Care, Other (non HMO) | Admitting: Family Medicine

## 2014-06-09 ENCOUNTER — Encounter: Payer: Self-pay | Admitting: Family Medicine

## 2014-06-09 VITALS — BP 135/83 | HR 63 | Temp 98.3°F | Ht 61.5 in | Wt 109.0 lb

## 2014-06-09 DIAGNOSIS — F3289 Other specified depressive episodes: Secondary | ICD-10-CM

## 2014-06-09 DIAGNOSIS — R51 Headache: Secondary | ICD-10-CM

## 2014-06-09 DIAGNOSIS — IMO0001 Reserved for inherently not codable concepts without codable children: Secondary | ICD-10-CM

## 2014-06-09 DIAGNOSIS — R030 Elevated blood-pressure reading, without diagnosis of hypertension: Secondary | ICD-10-CM

## 2014-06-09 DIAGNOSIS — F329 Major depressive disorder, single episode, unspecified: Secondary | ICD-10-CM

## 2014-06-09 DIAGNOSIS — R569 Unspecified convulsions: Secondary | ICD-10-CM

## 2014-06-09 DIAGNOSIS — F411 Generalized anxiety disorder: Secondary | ICD-10-CM

## 2014-06-09 NOTE — Progress Notes (Signed)
Pre visit review using our clinic review tool, if applicable. No additional management support is needed unless otherwise documented below in the visit note. 

## 2014-06-10 ENCOUNTER — Encounter: Payer: Self-pay | Admitting: Family Medicine

## 2014-06-10 NOTE — Progress Notes (Signed)
   Subjective:    Patient ID: [name], female    DOB: 11/01/1966, 48 y.o.   MRN: [id number]  HPI Here to recheck hier BP. She has never had HTN either to her or our knowledge, and her BP has always been normal in our clinic. However it was found to be elevated yesterday to about 170/100 or so. 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 She was told to see us about the BP. She feels fine. Today, as seen above, her BP is quite normal as usual.    Review of Systems  Constitutional: Negative.   Respiratory: Negative.   Cardiovascular: Negative.   Neurological: Negative.        Objective:   Physical Exam  Constitutional: She is oriented to person, place, and time. She appears well-developed and well-nourished.  Cardiovascular: Normal rate, regular rhythm, normal heart sounds and intact distal pulses.   Pulmonary/Chest: Effort normal and breath sounds normal.  Neurological: She is alert and oriented to person, place, and time.          Assessment & Plan:  She seems to be at her baseline. I do not believe she has HTN, and no doubt her elevated readings yesterday were caused by the stress of applying for a job. This should not prohibit her in any way from being considered for her job application. She will follow up prn.

## 2014-06-24 ENCOUNTER — Other Ambulatory Visit: Payer: Self-pay | Admitting: Internal Medicine

## 2014-06-26 NOTE — Telephone Encounter (Signed)
Call in #90 with 5 rf 

## 2014-07-09 ENCOUNTER — Other Ambulatory Visit: Payer: Self-pay | Admitting: Neurology

## 2014-07-09 MED ORDER — HYDROCODONE-ACETAMINOPHEN 5-325 MG PO TABS: 1.0000 | ORAL_TABLET | Freq: Four times a day (QID) | ORAL | Status: DC | PRN

## 2014-07-09 NOTE — Telephone Encounter (Signed)
Patient requesting refill of hydrocodone script to pick up.  °

## 2014-07-09 NOTE — Telephone Encounter (Signed)
Called pt and left message informing her that her Rx was ready to be picked up at the front desk and if she has any other problems, questions or concerns to call the office.  °

## 2014-07-09 NOTE — Telephone Encounter (Signed)
Dr Willis is out of the office.  Request forwarded to [facility] for approval.  

## 2014-08-12 ENCOUNTER — Other Ambulatory Visit: Payer: Self-pay | Admitting: Neurology

## 2014-08-12 MED ORDER — HYDROCODONE-ACETAMINOPHEN 5-325 MG PO TABS: 1.0000 | ORAL_TABLET | Freq: Four times a day (QID) | ORAL | Status: DC | PRN

## 2014-08-12 NOTE — Telephone Encounter (Signed)
Needs RX for HYDROcodone-acetaminophen (NORCO/VICODIN) 5-325 MG per tablet please call when ready ° °

## 2014-08-12 NOTE — Telephone Encounter (Signed)
Request forwarded to provider for approval  

## 2014-08-12 NOTE — Telephone Encounter (Signed)
Called pt and left message informing pt that her Rx was ready to be picked up at the front desk and if she has any other problems, questions or concerns to call the office. °

## 2014-09-01 ENCOUNTER — Ambulatory Visit (INDEPENDENT_AMBULATORY_CARE_PROVIDER_SITE_OTHER): Payer: BC Managed Care – PPO | Admitting: Family Medicine

## 2014-09-01 ENCOUNTER — Encounter: Payer: Self-pay | Admitting: Family Medicine

## 2014-09-01 VITALS — BP 147/95 | HR 92 | Temp 99.9°F | Ht 61.5 in | Wt 102.0 lb

## 2014-09-01 DIAGNOSIS — I10 Essential (primary) hypertension: Secondary | ICD-10-CM

## 2014-09-01 DIAGNOSIS — J209 Acute bronchitis, unspecified: Secondary | ICD-10-CM

## 2014-09-01 MED ORDER — METOPROLOL SUCCINATE ER 50 MG PO TB24: 50.0000 mg | ORAL_TABLET | Freq: Every day | ORAL | Status: DC

## 2014-09-01 MED ORDER — AZITHROMYCIN 250 MG PO TABS: ORAL_TABLET | ORAL | Status: DC

## 2014-09-01 NOTE — Progress Notes (Signed)
   Subjective:    Patient ID: [name], female    DOB: 25-Dec-1965, 48 y.o.   MRN: [id number]  HPI Here for 2 things. First we have been watching her BP for the past year and it seems to fluctuate at times. She was recently applying for a job and a health screening found her BP that day to be 200/110. She felt fine that day but was nervous. They gave her a form for [place] to sign to certify if her baseline BP is better than 149/99 or not. Also she has had stuffy sinuses, PND, ST, and a dry cough for the past 4 days.    Review of Systems  Constitutional: Negative.   HENT: Positive for congestion, postnasal drip and sinus pressure.   Eyes: Negative.   Respiratory: Positive for cough.   Cardiovascular: Negative.        Objective:   Physical Exam  Constitutional: She is oriented to person, place, and time. She appears well-developed and well-nourished.  HENT:  Right Ear: External ear normal.  Left Ear: External ear normal.  Nose: Nose normal.  Mouth/Throat: Oropharynx is clear and moist.  Eyes: Conjunctivae are normal.  Cardiovascular: Normal rate, regular rhythm, normal heart sounds and intact distal pulses.   Pulmonary/Chest: Effort normal and breath sounds normal.  Lymphadenopathy:    She has no cervical adenopathy.  Neurological: She is alert and oriented to person, place, and time.          Assessment & Plan:  Treat the bronchitis with a Zpack and Mucinex. We signed the form for her pre-employment screening but we will start treating her HTN with Metoprolol succinate 50 mg daily. Recheck in one month

## 2014-09-01 NOTE — Progress Notes (Signed)
Pre visit review using our clinic review tool, if applicable. No additional management support is needed unless otherwise documented below in the visit note. 

## 2014-09-08 ENCOUNTER — Telehealth: Payer: Self-pay | Admitting: Neurology

## 2014-09-08 NOTE — Telephone Encounter (Signed)
I spoke with patient who would like to wait to get Rx until tomorrow when Dr Anne HahnWillis returns.

## 2014-09-08 NOTE — Telephone Encounter (Signed)
Patient requesting refill of hydrocodone, please call when ready for pick up.  °

## 2014-09-09 ENCOUNTER — Other Ambulatory Visit: Payer: Self-pay

## 2014-09-09 MED ORDER — HYDROCODONE-ACETAMINOPHEN 5-325 MG PO TABS: 1.0000 | ORAL_TABLET | Freq: Four times a day (QID) | ORAL | Status: DC | PRN

## 2014-09-09 NOTE — Telephone Encounter (Signed)
Called pt to inform her that her Rx was ready to be picked up at the front desk and if she has any other problems, questions or concerns to call the office. Pt verbalized understanding. °

## 2014-09-10 ENCOUNTER — Ambulatory Visit (INDEPENDENT_AMBULATORY_CARE_PROVIDER_SITE_OTHER): Payer: BC Managed Care – PPO | Admitting: Family Medicine

## 2014-09-10 ENCOUNTER — Telehealth: Payer: Self-pay | Admitting: Family Medicine

## 2014-09-10 ENCOUNTER — Encounter: Payer: Self-pay | Admitting: Family Medicine

## 2014-09-10 VITALS — BP 144/88 | HR 83 | Temp 99.1°F | Ht 64.5 in | Wt 101.0 lb

## 2014-09-10 DIAGNOSIS — G4700 Insomnia, unspecified: Secondary | ICD-10-CM

## 2014-09-10 DIAGNOSIS — I10 Essential (primary) hypertension: Secondary | ICD-10-CM

## 2014-09-10 DIAGNOSIS — F32A Depression, unspecified: Secondary | ICD-10-CM

## 2014-09-10 DIAGNOSIS — F329 Major depressive disorder, single episode, unspecified: Secondary | ICD-10-CM

## 2014-09-10 MED ORDER — LAMOTRIGINE 25 MG PO TABS
25.0000 mg | ORAL_TABLET | Freq: Every day | ORAL | Status: DC
Start: 2014-09-10 — End: 2014-10-06

## 2014-09-10 MED ORDER — ZOLPIDEM TARTRATE 10 MG PO TABS: 20.0000 mg | ORAL_TABLET | Freq: Every evening | ORAL | Status: DC | PRN

## 2014-09-10 MED ORDER — METOPROLOL SUCCINATE ER 100 MG PO TB24: 100.0000 mg | ORAL_TABLET | Freq: Every day | ORAL | Status: DC

## 2014-09-10 NOTE — Telephone Encounter (Signed)
Patient Information:  Caller Name: Juliette AlcideMelinda  Phone: [phone]  Patient: [name], [name]  Gender: Female  DOB: March 12, 1966  Age: 48 Years  PCP: Gershon CraneFry, [facility]  Pregnant: No  Office Follow Up:  Does the office need to follow up with this patient?: No  Instructions For The Office: N/A  RN Note:  History of Hysterectomy. Patient states she is prescribed Temazepam for sleep. 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 Patient states she is only sleeping approx. 2 hours/day even when taking Temazepam as prescribed. Patient states she was previously prescribed Ambien for sleep which seemed to better help her sleep. Patient states Ambien was changed due to side effects of her needing to be watched while taking Ambien. Patient states that her spouse is at home during the day to monitor her when she is taking Ambien. Patient also states she has had intermittent sx of depression. Patient states that she is experiencing "empty nest" since her Son moved away from home 07/2014. Patient denies suicidal thoughts but states she frequently feels sad and has thoughts that life is not worth living. Care advice given per guidelines. Patient advised not to be alone. Patient advised to surround herself with supportive people.  RN spoke with Doy HutchingSaundrea, in office, and informed of above and disposition.  Appt. scheduled, per Tim LairSuandrea in office,  for 09/10/14 1545 with Dr. Clent RidgesFry. Patient informed of above. Call back parameters reviewed. Patient verbalizes understanding.  Symptoms  Reason For Call & Symptoms: Difficulty sleeping  Reviewed Health History In EMR: Yes  Reviewed Medications In EMR: Yes  Reviewed Allergies In EMR: Yes  Reviewed Surgeries / Procedures: Yes  Date of Onset of Symptoms: 09/03/2014  Treatments Tried: Temazepam  Treatments Tried Worked: No OB / GYN:  LMP: Unknown  Guideline(s) Used:  Insomnia  Depression  Disposition Per Guideline:   Call Local Agency Now  Reason For  Disposition Reached:   Recurrent thoughts of death (e.g., "life is not worth living") but not threatening suicide  Advice Given:  Reassurance:  Many people have difficulty sleeping.  There are a number of tips for healthy sleep that may help you.  Tips For Good Sleep:  Use your bed only for sleeping and sex. Do not use your bed for eating, reading or watching TV.  Regular exercise is good for your body and helps you sleep. Do not exercise right before bedtime.  Drink a small glass of warm milk at bedtime.  Take a warm bath or shower before bedtime.  Try to go to bed at the same time each night. More importantly, get up at the same time each morning (don't sleep in).  Tips For Good Sleep - Your Bedroom:  Keep bedroom temperature cool, not warm or cold.  Keep bedroom quiet and dark.  Tips For Good Sleep - Your Bedroom:  Keep bedroom temperature cool, not warm or cold.  Keep bedroom quiet and dark.  Use a comfortable mattress.  Tips For Good Sleep - When You Can  If you can't fall asleep after 30 minutes, get out of bed and do something relaxing.  Read a book or listen to some soothing music.  When you feel sleepy, go back to bed.  Repeat these steps as needed.  Tips For Good Sleep - When Worrying Keeps You Awake:  Do not use your bed as a place to worry about your problems.  Get up and write down problems or things that you need to do. Reassure yourself that you  can look at this list in the morning.  Tips For Good Sleep - What To Avoid:  Avoid caffeine within 6 hours of bedtime.  Avoid alcohol within 4 hours of bedtime.  Avoid heavy meals just before bedtime. Eating a light snack is OK.  Do not drink over 1 glass of liquid at bedtime (Reason: will need to get up to urinate).  Call Back If:  You become worse.  Call Back If:  Sadness or depression symptoms persist more than 2 weeks  You want to talk with a counselor  You feel like harming yourself  You become worse.  RN Overrode  Recommendation:  Make Appointment  Appt. scheduled per Saundrea, in office.

## 2014-09-10 NOTE — Progress Notes (Signed)
   Subjective:    Patient ID: [name], female    DOB: 26-Oct-1966, 48 y.o.   MRN: [id number]  HPI Here to follow up several issues. She has been taking Metoprolol succinate 50 mg daily and has tolerated this quite well. Her BP has still been a little high but she has avoided any more spikes up to 200 systolic like before. Also she is getting very little sleep on Temazepam and she wants to go back on Ambien which worked reasonably well in the past. Finally she asks to try a mew medication for her depression. She has tried many SSRIs and other agents in the past with poor results. She tried Seroquel at one point and had excellent results with her moods, but she had side effects including an elevated prolactin level and had to stop it. She asks if we can try another "antipsychotic" med. She denies any hallucinations or delusions lately.    Review of Systems  Constitutional: Negative.   Respiratory: Negative.   Cardiovascular: Negative.   Neurological: Negative.   Psychiatric/Behavioral: Positive for sleep disturbance, dysphoric mood, decreased concentration and agitation. Negative for suicidal ideas, hallucinations, behavioral problems, confusion and self-injury. The patient is nervous/anxious. The patient is not hyperactive.        Objective:   Physical Exam  Constitutional: She is oriented to person, place, and time. She appears well-developed and well-nourished.  Cardiovascular: Normal rate, regular rhythm, normal heart sounds and intact distal pulses.   Pulmonary/Chest: Effort normal and breath sounds normal.  Neurological: She is alert and oriented to person, place, and time.  Psychiatric: She has a normal mood and affect. Her behavior is normal. Thought content normal.          Assessment & Plan:  We will increase the Metoprolol to 100 mg daily. Switch back to Ambien for sleep. Rather than another antipsychotic per se, she will try Lamictal 25 mg at bedtime. Recheck in 2  weeks

## 2014-09-10 NOTE — Telephone Encounter (Signed)
Pt did see Dr. Clent RidgesFry today in the office 09/10/14.

## 2014-09-10 NOTE — Progress Notes (Signed)
Pre visit review using our clinic review tool, if applicable. No additional management support is needed unless otherwise documented below in the visit note. 

## 2014-10-02 ENCOUNTER — Telehealth: Payer: Self-pay | Admitting: Family Medicine

## 2014-10-02 NOTE — Telephone Encounter (Signed)
Pt would like lamotrigine 25 mg to be increase to next mg. Pt stated md said he would increase

## 2014-10-03 ENCOUNTER — Other Ambulatory Visit: Payer: Self-pay

## 2014-10-03 MED ORDER — PREGABALIN 75 MG PO CAPS: 75.0000 mg | ORAL_CAPSULE | Freq: Two times a day (BID) | ORAL | Status: DC

## 2014-10-06 ENCOUNTER — Ambulatory Visit: Payer: Managed Care, Other (non HMO) | Admitting: Neurology

## 2014-10-06 ENCOUNTER — Telehealth: Payer: Self-pay | Admitting: Neurology

## 2014-10-06 MED ORDER — LAMOTRIGINE 25 MG PO TABS: 50.0000 mg | ORAL_TABLET | Freq: Every day | ORAL | Status: DC

## 2014-10-06 NOTE — Telephone Encounter (Signed)
The patient called and canceled her appointment same day, indicating she was sick.

## 2014-10-06 NOTE — Telephone Encounter (Signed)
Patient requesting refills of Vicodin. Best call back [phone].

## 2014-10-06 NOTE — Telephone Encounter (Signed)
I called and spoke with the patient.  She is aware Lyrica has already been processed and Vicodin was last written 11/06, so it is a couple days too soon.

## 2014-10-06 NOTE — Telephone Encounter (Signed)
Patient called back and requested Rx refill for pregabalin (LYRICA) 75 MG capsule as well.  Please call and advise.

## 2014-10-06 NOTE — Telephone Encounter (Signed)
I sent new script e-scribe and spoke with pt. 

## 2014-10-06 NOTE — Telephone Encounter (Signed)
Increase lamictal to 50 mg a day, so she needs to take two 25 mg tabs a day, call in #60 with 5 rf

## 2014-10-07 ENCOUNTER — Encounter: Payer: Self-pay | Admitting: *Deleted

## 2014-10-08 ENCOUNTER — Other Ambulatory Visit: Payer: Self-pay | Admitting: Neurology

## 2014-10-08 MED ORDER — HYDROCODONE-ACETAMINOPHEN 5-325 MG PO TABS: 1.0000 | ORAL_TABLET | Freq: Four times a day (QID) | ORAL | Status: DC | PRN

## 2014-10-08 NOTE — Telephone Encounter (Signed)
I called the patient to let them know their Rx for Hydrocodone Acetaminophen was ready for pickup. Patient was instructed to bring Photo ID.

## 2014-10-08 NOTE — Addendum Note (Signed)
Addended by: Arlis PortaHUGHES, Tauna Macfarlane on: 10/08/2014 10:44 AM   Modules accepted: Medications

## 2014-10-08 NOTE — Telephone Encounter (Signed)
Request entered, forwarded to provider for approval.  

## 2014-10-08 NOTE — Telephone Encounter (Signed)
Patient requesting Rx refill for HYDROcodone-acetaminophen (NORCO/VICODIN) 5-325 MG per tablet.  Please call when ready for pick up. °

## 2014-10-18 ENCOUNTER — Inpatient Hospital Stay (HOSPITAL_COMMUNITY)
Admission: EM | Admit: 2014-10-18 | Discharge: 2014-10-24 | DRG: 917 | Disposition: A | Discharge status: Home / Self Care | Payer: BC Managed Care – PPO | Attending: Internal Medicine | Admitting: Internal Medicine

## 2014-10-18 ENCOUNTER — Emergency Department (HOSPITAL_COMMUNITY): Payer: BC Managed Care – PPO

## 2014-10-18 ENCOUNTER — Encounter (HOSPITAL_COMMUNITY): Payer: Self-pay | Admitting: Emergency Medicine

## 2014-10-18 DIAGNOSIS — R636 Underweight: Secondary | ICD-10-CM | POA: Diagnosis present

## 2014-10-18 DIAGNOSIS — R630 Anorexia: Secondary | ICD-10-CM | POA: Diagnosis present

## 2014-10-18 DIAGNOSIS — Z888 Allergy status to other drugs, medicaments and biological substances status: Secondary | ICD-10-CM

## 2014-10-18 DIAGNOSIS — R001 Bradycardia, unspecified: Secondary | ICD-10-CM | POA: Diagnosis present

## 2014-10-18 DIAGNOSIS — T424X2A Poisoning by benzodiazepines, intentional self-harm, initial encounter: Secondary | ICD-10-CM | POA: Diagnosis present

## 2014-10-18 DIAGNOSIS — N179 Acute kidney failure, unspecified: Secondary | ICD-10-CM | POA: Diagnosis present

## 2014-10-18 DIAGNOSIS — Z9049 Acquired absence of other specified parts of digestive tract: Secondary | ICD-10-CM | POA: Diagnosis present

## 2014-10-18 DIAGNOSIS — I4581 Long QT syndrome: Secondary | ICD-10-CM

## 2014-10-18 DIAGNOSIS — R509 Fever, unspecified: Secondary | ICD-10-CM

## 2014-10-18 DIAGNOSIS — I10 Essential (primary) hypertension: Secondary | ICD-10-CM | POA: Diagnosis present

## 2014-10-18 DIAGNOSIS — J9601 Acute respiratory failure with hypoxia: Secondary | ICD-10-CM | POA: Diagnosis present

## 2014-10-18 DIAGNOSIS — T50902A Poisoning by unspecified drugs, medicaments and biological substances, intentional self-harm, initial encounter: Secondary | ICD-10-CM

## 2014-10-18 DIAGNOSIS — K5909 Other constipation: Secondary | ICD-10-CM | POA: Diagnosis present

## 2014-10-18 DIAGNOSIS — Y92003 Bedroom of unspecified non-institutional (private) residence as the place of occurrence of the external cause: Secondary | ICD-10-CM

## 2014-10-18 DIAGNOSIS — F332 Major depressive disorder, recurrent severe without psychotic features: Secondary | ICD-10-CM | POA: Diagnosis present

## 2014-10-18 DIAGNOSIS — G92 Toxic encephalopathy: Secondary | ICD-10-CM | POA: Diagnosis present

## 2014-10-18 DIAGNOSIS — G4700 Insomnia, unspecified: Secondary | ICD-10-CM | POA: Diagnosis present

## 2014-10-18 DIAGNOSIS — X838XXA Intentional self-harm by other specified means, initial encounter: Secondary | ICD-10-CM

## 2014-10-18 DIAGNOSIS — G9340 Encephalopathy, unspecified: Secondary | ICD-10-CM

## 2014-10-18 DIAGNOSIS — Z79899 Other long term (current) drug therapy: Secondary | ICD-10-CM | POA: Diagnosis not present

## 2014-10-18 DIAGNOSIS — R569 Unspecified convulsions: Secondary | ICD-10-CM | POA: Diagnosis present

## 2014-10-18 DIAGNOSIS — F411 Generalized anxiety disorder: Secondary | ICD-10-CM | POA: Diagnosis present

## 2014-10-18 DIAGNOSIS — T50901A Poisoning by unspecified drugs, medicaments and biological substances, accidental (unintentional), initial encounter: Secondary | ICD-10-CM

## 2014-10-18 DIAGNOSIS — Z90710 Acquired absence of both cervix and uterus: Secondary | ICD-10-CM

## 2014-10-18 DIAGNOSIS — M1990 Unspecified osteoarthritis, unspecified site: Secondary | ICD-10-CM | POA: Diagnosis present

## 2014-10-18 DIAGNOSIS — D72829 Elevated white blood cell count, unspecified: Secondary | ICD-10-CM | POA: Diagnosis present

## 2014-10-18 DIAGNOSIS — E861 Hypovolemia: Secondary | ICD-10-CM | POA: Diagnosis present

## 2014-10-18 DIAGNOSIS — T426X2A Poisoning by other antiepileptic and sedative-hypnotic drugs, intentional self-harm, initial encounter: Secondary | ICD-10-CM | POA: Diagnosis present

## 2014-10-18 DIAGNOSIS — Z789 Other specified health status: Secondary | ICD-10-CM

## 2014-10-18 DIAGNOSIS — Z681 Body mass index (BMI) 19 or less, adult: Secondary | ICD-10-CM | POA: Diagnosis not present

## 2014-10-18 DIAGNOSIS — R4182 Altered mental status, unspecified: Secondary | ICD-10-CM | POA: Insufficient documentation

## 2014-10-18 DIAGNOSIS — F42 Obsessive-compulsive disorder: Secondary | ICD-10-CM | POA: Diagnosis present

## 2014-10-18 DIAGNOSIS — G40309 Generalized idiopathic epilepsy and epileptic syndromes, not intractable, without status epilepticus: Secondary | ICD-10-CM | POA: Diagnosis present

## 2014-10-18 HISTORY — DX: Poisoning by unspecified drugs, medicaments and biological substances, accidental (unintentional), initial encounter: T50.901A

## 2014-10-18 HISTORY — DX: Intentional self-harm by other specified means, initial encounter: X83.8XXA

## 2014-10-18 LAB — RAPID URINE DRUG SCREEN, HOSP PERFORMED
AMPHETAMINES: NOT DETECTED
BARBITURATES: NOT DETECTED
BENZODIAZEPINES: POSITIVE — AB
Cocaine: NOT DETECTED
Opiates: POSITIVE — AB
TETRAHYDROCANNABINOL: NOT DETECTED

## 2014-10-18 LAB — URINALYSIS, ROUTINE W REFLEX MICROSCOPIC
BILIRUBIN URINE: NEGATIVE
GLUCOSE, UA: NEGATIVE mg/dL
Hgb urine dipstick: NEGATIVE
Ketones, ur: NEGATIVE mg/dL
Leukocytes, UA: NEGATIVE
Nitrite: NEGATIVE
PROTEIN: NEGATIVE mg/dL
Specific Gravity, Urine: 1.016 (ref 1.005–1.030)
UROBILINOGEN UA: 1 mg/dL (ref 0.0–1.0)
pH: 7.5 (ref 5.0–8.0)

## 2014-10-18 LAB — COMPREHENSIVE METABOLIC PANEL
ALK PHOS: 67 U/L (ref 39–117)
ALT: 12 U/L (ref 0–35)
ANION GAP: 12 (ref 5–15)
AST: 20 U/L (ref 0–37)
Albumin: 3 g/dL — ABNORMAL LOW (ref 3.5–5.2)
BILIRUBIN TOTAL: 0.3 mg/dL (ref 0.3–1.2)
BUN: 25 mg/dL — ABNORMAL HIGH (ref 6–23)
CO2: 20 meq/L (ref 19–32)
Calcium: 8.3 mg/dL — ABNORMAL LOW (ref 8.4–10.5)
Chloride: 107 mEq/L (ref 96–112)
Creatinine, Ser: 1.5 mg/dL — ABNORMAL HIGH (ref 0.50–1.10)
GFR, EST AFRICAN AMERICAN: 47 mL/min — AB (ref >90)
GFR, EST NON AFRICAN AMERICAN: 40 mL/min — AB (ref >90)
GLUCOSE: 189 mg/dL — AB (ref 70–99)
POTASSIUM: 3.5 meq/L — AB (ref 3.7–5.3)
SODIUM: 139 meq/L (ref 137–147)
Total Protein: 6.3 g/dL (ref 6.0–8.3)

## 2014-10-18 LAB — I-STAT CG4 LACTIC ACID, ED: Lactic Acid, Venous: 1.87 mmol/L (ref 0.5–2.2)

## 2014-10-18 LAB — CBC WITH DIFFERENTIAL/PLATELET
Basophils Absolute: 0 10*3/uL (ref 0.0–0.1)
Basophils Relative: 0 % (ref 0–1)
Eosinophils Absolute: 0 10*3/uL (ref 0.0–0.7)
Eosinophils Relative: 0 % (ref 0–5)
HCT: 36.5 % (ref 36.0–46.0)
HEMOGLOBIN: 12 g/dL (ref 12.0–15.0)
LYMPHS ABS: 0.6 10*3/uL — AB (ref 0.7–4.0)
LYMPHS PCT: 6 % — AB (ref 12–46)
MCH: 33.1 pg (ref 26.0–34.0)
MCHC: 32.9 g/dL (ref 30.0–36.0)
MCV: 100.8 fL — AB (ref 78.0–100.0)
Monocytes Absolute: 0.3 10*3/uL (ref 0.1–1.0)
Monocytes Relative: 3 % (ref 3–12)
NEUTROS PCT: 91 % — AB (ref 43–77)
Neutro Abs: 8.1 10*3/uL — ABNORMAL HIGH (ref 1.7–7.7)
Platelets: 204 10*3/uL (ref 150–400)
RBC: 3.62 MIL/uL — AB (ref 3.87–5.11)
RDW: 12.8 % (ref 11.5–15.5)
WBC: 9 10*3/uL (ref 4.0–10.5)

## 2014-10-18 LAB — MRSA PCR SCREENING: MRSA by PCR: NEGATIVE

## 2014-10-18 LAB — I-STAT ARTERIAL BLOOD GAS, ED
ACID-BASE DEFICIT: 4 mmol/L — AB (ref 0.0–2.0)
BICARBONATE: 22.5 meq/L (ref 20.0–24.0)
O2 Saturation: 100 %
TCO2: 24 mmol/L (ref 0–100)
pCO2 arterial: 40.6 mmHg (ref 35.0–45.0)
pH, Arterial: 7.337 — ABNORMAL LOW (ref 7.350–7.450)
pO2, Arterial: 394 mmHg — ABNORMAL HIGH (ref 80.0–100.0)

## 2014-10-18 LAB — CBG MONITORING, ED: GLUCOSE-CAPILLARY: 195 mg/dL — AB (ref 70–99)

## 2014-10-18 LAB — I-STAT TROPONIN, ED: Troponin i, poc: 0.01 ng/mL (ref 0.00–0.08)

## 2014-10-18 LAB — SALICYLATE LEVEL

## 2014-10-18 LAB — GLUCOSE, CAPILLARY: GLUCOSE-CAPILLARY: 101 mg/dL — AB (ref 70–99)

## 2014-10-18 LAB — PREGNANCY, URINE: PREG TEST UR: NEGATIVE

## 2014-10-18 LAB — CK: Total CK: 274 U/L — ABNORMAL HIGH (ref 7–177)

## 2014-10-18 LAB — ETHANOL: Alcohol, Ethyl (B): <11 mg/dL (ref 0–11)

## 2014-10-18 LAB — ACETAMINOPHEN LEVEL

## 2014-10-18 LAB — MAGNESIUM: MAGNESIUM: 1.8 mg/dL (ref 1.5–2.5)

## 2014-10-18 MED ORDER — SODIUM CHLORIDE 0.9 % IV BOLUS (SEPSIS)
1000.0000 mL | Freq: Once | INTRAVENOUS | Status: AC
  Administered 2014-10-18: 1000 mL via INTRAVENOUS

## 2014-10-18 MED ORDER — CHLORHEXIDINE GLUCONATE 0.12 % MT SOLN
15.0000 mL | Freq: Two times a day (BID) | OROMUCOSAL | Status: DC
  Administered 2014-10-19: 15 mL via OROMUCOSAL
  Filled 2014-10-18: qty 15

## 2014-10-18 MED ORDER — ETOMIDATE 2 MG/ML IV SOLN
20.0000 mg | Freq: Once | INTRAVENOUS | Status: AC
  Administered 2014-10-18: 20 mg via INTRAVENOUS

## 2014-10-18 MED ORDER — ONDANSETRON HCL 4 MG/2ML IJ SOLN
4.0000 mg | Freq: Four times a day (QID) | INTRAMUSCULAR | Status: DC | PRN
  Administered 2014-10-19: 4 mg via INTRAVENOUS
  Filled 2014-10-18: qty 2

## 2014-10-18 MED ORDER — SUCCINYLCHOLINE CHLORIDE 20 MG/ML IJ SOLN
INTRAMUSCULAR | Status: AC
  Filled 2014-10-18: qty 1

## 2014-10-18 MED ORDER — ACETAMINOPHEN 325 MG PO TABS
650.0000 mg | ORAL_TABLET | ORAL | Status: DC | PRN
  Administered 2014-10-19 – 2014-10-24 (×5): 650 mg via ORAL
  Filled 2014-10-18 (×5): qty 2

## 2014-10-18 MED ORDER — SODIUM CHLORIDE 0.9 % IV SOLN
Freq: Once | INTRAVENOUS | Status: AC
  Administered 2014-10-18: 100 mL/h via INTRAVENOUS

## 2014-10-18 MED ORDER — ROCURONIUM BROMIDE 50 MG/5ML IV SOLN
60.0000 mg | Freq: Once | INTRAVENOUS | Status: AC
  Administered 2014-10-18: 60 mg via INTRAVENOUS

## 2014-10-18 MED ORDER — CETYLPYRIDINIUM CHLORIDE 0.05 % MT LIQD
7.0000 mL | Freq: Four times a day (QID) | OROMUCOSAL | Status: DC
  Administered 2014-10-19 (×3): 7 mL via OROMUCOSAL

## 2014-10-18 MED ORDER — ENOXAPARIN SODIUM 40 MG/0.4ML ~~LOC~~ SOLN
40.0000 mg | SUBCUTANEOUS | Status: DC
  Administered 2014-10-18 – 2014-10-19 (×2): 40 mg via SUBCUTANEOUS
  Filled 2014-10-18 (×3): qty 0.4

## 2014-10-18 MED ORDER — SODIUM CHLORIDE 0.9 % IV SOLN: 250.0000 mL | INTRAVENOUS | Status: DC | PRN

## 2014-10-18 MED ORDER — LIDOCAINE HCL (CARDIAC) 20 MG/ML IV SOLN
INTRAVENOUS | Status: AC
  Filled 2014-10-18: qty 5

## 2014-10-18 MED ORDER — POTASSIUM CHLORIDE 10 MEQ/100ML IV SOLN
10.0000 meq | INTRAVENOUS | Status: AC
  Administered 2014-10-18 (×3): 10 meq via INTRAVENOUS
  Filled 2014-10-18 (×3): qty 100

## 2014-10-18 MED ORDER — ETOMIDATE 2 MG/ML IV SOLN
INTRAVENOUS | Status: AC
  Filled 2014-10-18: qty 20

## 2014-10-18 MED ORDER — ROCURONIUM BROMIDE 50 MG/5ML IV SOLN
INTRAVENOUS | Status: AC
  Filled 2014-10-18: qty 2

## 2014-10-18 MED ORDER — FENTANYL CITRATE 0.05 MG/ML IJ SOLN
100.0000 ug | INTRAMUSCULAR | Status: DC | PRN
  Administered 2014-10-18: 100 ug via INTRAVENOUS

## 2014-10-18 MED ORDER — FENTANYL CITRATE 0.05 MG/ML IJ SOLN
100.0000 ug | INTRAMUSCULAR | Status: DC | PRN
  Administered 2014-10-18 – 2014-10-19 (×4): 100 ug via INTRAVENOUS
  Filled 2014-10-18 (×5): qty 2

## 2014-10-18 MED ORDER — FAMOTIDINE IN NACL 20-0.9 MG/50ML-% IV SOLN
20.0000 mg | Freq: Two times a day (BID) | INTRAVENOUS | Status: DC
  Administered 2014-10-18: 20 mg via INTRAVENOUS
  Filled 2014-10-18 (×3): qty 50

## 2014-10-18 NOTE — Progress Notes (Signed)
eLink Physician-Brief Progress Note Patient Name: Philipp OvensMelinda S XXXLechner DOB: 07/11/1966 MRN: [id number]   Date of Service  10/18/2014  HPI/Events of Note   Low BP reading alert received.    eICU Interventions   RN to recycle pressure and call box if "hypotension" persists      Intervention Category Intermediate Interventions: Hypotension - evaluation and management  Kimbella Heisler R. 10/18/2014, 8:51 PM

## 2014-10-18 NOTE — Progress Notes (Signed)
Chaplain checked in with family briefly. Family wanted to speak to dr so chaplain found dr, facilitated communication. Will follow as needed.   Wille GlaserMcCray, Khira Cudmore O, Chaplain 10/18/2014 5:25 PM

## 2014-10-18 NOTE — ED Provider Notes (Signed)
CSN: [id number]     Arrival date & time 10/18/14  1546 History   First MD Initiated Contact with Patient 10/18/14 1559     Chief Complaint  Patient presents with  . Cardiac Arrest   Patient is a 48 y.o. female presenting with altered mental status. The history is provided by the EMS personnel.  Altered Mental Status Presenting symptoms: unresponsiveness   Severity:  Severe Most recent episode:  Today Episode history:  Unable to specify Duration: unknown; last seen normal at 3am. Timing:  Unable to specify Progression:  Improving Chronicity:  New Context comment:  Seizure history; history of SI Associated symptoms: depression, seizures and vomiting   Associated symptoms: no fever   Associated symptoms comment:  Empty bottles of zonisamide filled 09/06/14, Lyrica, lamotrigine filled 10/06/14; partially full bottles of metoprolol and premarin; many missing pills from Ambien and Xanax Seizures:    Seizure activity on arrival: no   Vomiting:    Quality:  Stomach contents   Severity:  Unable to specify   Past Medical History  Diagnosis Date  . Epilepsy     seizures, sees Dr. Anne Hahn  . Anxiety   . Depression   . Insomnia   . Headache(784.0)     migraine  . Hypertension   . Suicidal ideation   . Obsessive compulsive disorder   . Arthritis   . Gallstones   . Hemorrhoids   . Chronic constipation 01/10/2014   Past Surgical History  Procedure Laterality Date  . Abdominal hysterectomy  2001  . Cholecystectomy    . Breast lumpectomy Left 01/2007    Dr. Abbey Chatters  . Tonsillectomy    . Cesarean section    . Appendectomy Right     with ovarian cyst removal  . Hemorrhoid banding  2015   Family History  Problem Relation Age of Onset  . Cirrhosis Mother   . Heart disease Father   . Hypertension Father   . Thyroid cancer Brother   . HIV Brother   . Pancreatic cancer Maternal Uncle   . Stomach cancer Mother   . Heart disease Mother    History  Substance Use Topics  .  Smoking status: Never Smoker   . Smokeless tobacco: Never Used  . Alcohol Use: Yes     Comment: rare   OB History    No data available     Review of Systems  Unable to perform ROS: Patient unresponsive  Constitutional: Negative for fever.  Gastrointestinal: Positive for vomiting.  Neurological: Positive for seizures.      Allergies  Vimpat  Home Medications   Prior to Admission medications   Medication Sig Start Date End Date Taking? Authorizing Provider  alprazolam Prudy Feeler) 2 MG tablet TAKE 1 TABLET BY MOUTH 3 TIMES A DAY AS NEEDED    Nelwyn Salisbury, MD  azithromycin [facility]) 250 MG tablet As directed 09/01/14   Nelwyn Salisbury, MD  HYDROcodone-acetaminophen (NORCO/VICODIN) 5-325 MG per tablet Take 1 tablet by mouth every 6 (six) hours as needed. 10/08/14   York Spaniel, MD  lamoTRIgine (LAMICTAL) 25 MG tablet Take 2 tablets (50 mg total) by mouth daily. 10/06/14   Nelwyn Salisbury, MD  metoprolol succinate (TOPROL-XL) 100 MG 24 hr tablet Take 1 tablet (100 mg total) by mouth daily. Take with or immediately following a meal. 09/10/14   Nelwyn Salisbury, MD  metoprolol succinate (TOPROL-XL) 50 MG 24 hr tablet  09/01/14   Historical Provider, MD  pregabalin (LYRICA) 75  MG capsule Take 1 capsule (75 mg total) by mouth 2 (two) times daily. 10/03/14   York Spanielharles K Willis, MD  PREMARIN 0.625 MG tablet TAKE 1 TABLET DAILY FOR 21 DAYS THEN DO NOT TAKE FOR 7 DAYS. 02/10/14   Nelwyn SalisburyStephen A Fry, MD  temazepam (RESTORIL) 30 MG capsule  08/25/14   Historical Provider, MD  zolpidem (AMBIEN) 10 MG tablet Take 2 tablets (20 mg total) by mouth at bedtime as needed for sleep. 09/10/14 10/10/14  Nelwyn SalisburyStephen A Fry, MD  zonisamide (ZONEGRAN) 100 MG capsule Take 3 capsules (300 mg total) by mouth 2 (two) times daily. 03/27/14   York Spanielharles K Willis, MD   BP 117/68 mmHg  Pulse 63  Temp(Src) 93.9 F (34.4 C) (Core (Comment))  Resp 14  Ht 5\' 4"  (1.626 m)  SpO2 100%   Physical Exam  Constitutional: She appears  well-developed and well-nourished.  62Unresponsive middle aged female, moving upper extremities spontaneously, emesis in hair, nasal trumpet in place, mask ventilation assisting with respirations - some spontaneous respirations noted  HENT:  Head: Normocephalic and atraumatic.  Right Ear: External ear normal.  Left Ear: External ear normal.  Nasal trumpet in right nare  Eyes: Pupils are equal, round, and reactive to light.  Pupils 3-774mm reactive to 2mm with light; no gaze deviation  Neck:  C-collar applied immediately after intubation  Cardiovascular: Normal rate and regular rhythm.   Pulmonary/Chest: Bradypnea noted. She has rhonchi.  Abdominal: Soft. Normal appearance. She exhibits no distension.  Neurological: She is unresponsive.  Eyes open to voice, no verbal response, moving left upper extremity rhythmically then spontaneously reaching for cardiac leads with both arms  Skin: Skin is dry. No rash noted. She is not diaphoretic.  Psychiatric:  Unable to assess  Vitals reviewed.   ED Course  INTUBATION Date/Time: 10/18/2014 4:25 PM Performed by: Melene MullerBATISTA, Smrithi Pigford Authorized by: Maxine GlennBATISTA, Remedios Mckone Consent: The procedure was performed in an emergent situation. Indications: respiratory distress,  respiratory failure and  airway protection Intubation method: direct Patient status: paralyzed (RSI) Preoxygenation: BVM Sedatives: etomidate Paralytic: rocuronium Laryngoscope size: Mac 3 Tube type: cuffed Number of attempts: 2 Ventilation between attempts: BVM Cricoid pressure: yes Cords visualized: yes Post-procedure assessment: ETCO2 monitor and chest rise Breath sounds: equal Cuff inflated: yes ETT to lip: 24 cm Tube secured with: ETT holder Chest x-ray interpreted by me. Chest x-ray findings: endotracheal tube in appropriate position Patient tolerance: Patient tolerated the procedure well with no immediate complications    Labs Review Labs Reviewed  CBC WITH DIFFERENTIAL -  Abnormal; Notable for the following:    RBC 3.62 (*)    MCV 100.8 (*)    Neutrophils Relative % 91 (*)    Neutro Abs 8.1 (*)    Lymphocytes Relative 6 (*)    Lymphs Abs 0.6 (*)    All other components within normal limits  COMPREHENSIVE METABOLIC PANEL - Abnormal; Notable for the following:    Potassium 3.5 (*)    Glucose, Bld 189 (*)    BUN 25 (*)    Creatinine, Ser 1.50 (*)    Calcium 8.3 (*)    Albumin 3.0 (*)    GFR calc non Af Amer 40 (*)    GFR calc Af Amer 47 (*)    All other components within normal limits  URINE RAPID DRUG SCREEN (HOSP PERFORMED) - Abnormal; Notable for the following:    Opiates POSITIVE (*)    Benzodiazepines POSITIVE (*)    All other components within normal limits  SALICYLATE LEVEL - Abnormal; Notable for the following:    Salicylate Lvl <2.0 (*)    All other components within normal limits  GLUCOSE, CAPILLARY - Abnormal; Notable for the following:    Glucose-Capillary 101 (*)    All other components within normal limits  CBG MONITORING, ED - Abnormal; Notable for the following:    Glucose-Capillary 195 (*)    All other components within normal limits  I-STAT ARTERIAL BLOOD GAS, ED - Abnormal; Notable for the following:    pH, Arterial 7.337 (*)    pO2, Arterial 394.0 (*)    Acid-base deficit 4.0 (*)    All other components within normal limits  MRSA PCR SCREENING  ETHANOL  ACETAMINOPHEN LEVEL  URINALYSIS, ROUTINE W REFLEX MICROSCOPIC  PREGNANCY, URINE  MAGNESIUM  CK  CBC  BASIC METABOLIC PANEL  MAGNESIUM  PHOSPHORUS  I-STAT TROPOININ, ED  I-STAT CG4 LACTIC ACID, ED    Imaging Review Ct Head Wo Contrast  10/18/2014   CLINICAL DATA:  Post CPR, history of seizures, altered mental status  EXAM: CT HEAD WITHOUT CONTRAST  CT CERVICAL SPINE WITHOUT CONTRAST  TECHNIQUE: Multidetector CT imaging of the head and cervical spine was performed following the standard protocol without intravenous contrast. Multiplanar CT image reconstructions of  the cervical spine were also generated.  COMPARISON:  None.  FINDINGS: CT HEAD FINDINGS  No skull fracture is noted. Mastoid air cells are unremarkable. There is mucosal thickening with partial opacification bilateral ethmoid air cells. Mucosal thickening with air-fluid level noted bilateral sphenoid sinus. Abundant secretions are noted in nasopharynx.  No intracranial hemorrhage, mass effect or midline shift. The gray and white-matter differentiation is preserved. No acute cortical infarction. No mass lesion is noted on this unenhanced scan.  CT CERVICAL SPINE FINDINGS  Axial images shows no acute fracture or subluxation. Computer processed images shows cervical spine shows alignment, disc spaces and vertebral body heights to be preserved. No acute fracture or subluxation. Endotracheal and NG tube in place is noted.  There is no pneumothorax in visualized lung apices. Bilateral apical Mild pleural parenchymal scarring.  IMPRESSION: 1. No acute intracranial abnormality. 2. There is mucosal thickening with partial opacification bilateral ethmoid air cells. Mucosal thickening with air-fluid level bilateral sphenoid sinus. Abundant secretions are noted in nasopharynx. 3. No cervical spine acute fracture or subluxation. NG tube and endotracheal tube partially visualized.   Electronically Signed   By: Natasha Mead M.D.   On: 10/18/2014 17:17   Ct Cervical Spine Wo Contrast  10/18/2014   CLINICAL DATA:  Post CPR, history of seizures, altered mental status  EXAM: CT HEAD WITHOUT CONTRAST  CT CERVICAL SPINE WITHOUT CONTRAST  TECHNIQUE: Multidetector CT imaging of the head and cervical spine was performed following the standard protocol without intravenous contrast. Multiplanar CT image reconstructions of the cervical spine were also generated.  COMPARISON:  None.  FINDINGS: CT HEAD FINDINGS  No skull fracture is noted. Mastoid air cells are unremarkable. There is mucosal thickening with partial opacification bilateral  ethmoid air cells. Mucosal thickening with air-fluid level noted bilateral sphenoid sinus. Abundant secretions are noted in nasopharynx.  No intracranial hemorrhage, mass effect or midline shift. The gray and white-matter differentiation is preserved. No acute cortical infarction. No mass lesion is noted on this unenhanced scan.  CT CERVICAL SPINE FINDINGS  Axial images shows no acute fracture or subluxation. Computer processed images shows cervical spine shows alignment, disc spaces and vertebral body heights to be preserved. No acute fracture or subluxation.  Endotracheal and NG tube in place is noted.  There is no pneumothorax in visualized lung apices. Bilateral apical Mild pleural parenchymal scarring.  IMPRESSION: 1. No acute intracranial abnormality. 2. There is mucosal thickening with partial opacification bilateral ethmoid air cells. Mucosal thickening with air-fluid level bilateral sphenoid sinus. Abundant secretions are noted in nasopharynx. 3. No cervical spine acute fracture or subluxation. NG tube and endotracheal tube partially visualized.   Electronically Signed   By: Natasha MeadLiviu  Pop M.D.   On: 10/18/2014 17:17   Dg Chest Port 1 View  10/18/2014   CLINICAL DATA:  intubation, post cardiopulmonary resuscitation  EXAM: PORTABLE CHEST - 1 VIEW  COMPARISON:  01/04/2007  FINDINGS: Cardiomediastinal silhouette is stable. Mild hyperinflation. Endotracheal tube in place with tip 2 cm above the carina. NG tube in place with tip in mid stomach. No acute infiltrate or pulmonary edema. No pneumothorax.  IMPRESSION: No active disease.  Endotracheal tube in place.  No pneumothorax.   Electronically Signed   By: Natasha MeadLiviu  Pop M.D.   On: 10/18/2014 17:12     EKG Interpretation   Date/Time:  Saturday October 18 2014 15:54:30 EST Ventricular Rate:  79 PR Interval:  148 QRS Duration: 98 QT Interval:  429 QTC Calculation: 492 R Axis:   46 Text Interpretation:  Sinus rhythm Consider right atrial enlargement   Abnormal R-wave progression, early transition Borderline T abnormalities,  lateral leads Borderline prolonged QT interval Baseline wander in lead(s)  V4 V5 V6 No significant change since last tracing Confirmed by Anitra LauthPLUNKETT   MD, Alphonzo LemmingsWHITNEY ([id number]) on 10/18/2014 5:51:45 PM      MDM   Final diagnoses:  Cardiopulmonary resuscitation (CPR)-only resuscitation status  Altered mental state   48 y.o. female with a past medical history of seizures, anxiety, depression, suicidal ideation, OCD presents due to being found unresponsive by her son. Son performed CPR for an unknown amount of time. EMS arrived and paced her. Gave her Narcan, Sodium Bicarb, and Glucagon. EMS did not perform any compressions of cardioversion.   Concern for overdose. Empty bottles of zonisamide filled 09/06/14, Lyrica, lamotrigine filled 10/06/14; partially full bottles of metoprolol and premarin; many missing pills from Ambien and Xanax which husband states was full or nearly full.  On arrival, unresponsive. Moving LUE rhythmically which progressed to her reaching for cardiac leads with both upper extremities. She opened her eyes to voice just prior to receiving RSI drugs. Intubated. See procedure note above.   CXR showed no acute process. ETT and OGT in good position. CT Head and C-spine obtained due to her being found down, presumed fall, both of which were negative.   Per Poison Control: If widened QRS, do sodium bicarb bolus not a drip. Supportive Care. Benzos for seizure activity. Added on a Magnesium level and CK per their recommendations.   Co-ingestant levels negative. EKG with borderline prolonged QTc. UDS positive for Opiates and Benzos. Does not have rx for opiates.   I discussed her case with the intensivist on-call. She was admitted to the ICU. Mg and CK levels pending.   This case managed in conjunction with my attending, Dr. Gwyneth SproutWhitney Plunkett.     Maxine GlennAnn Denali Sharma, MD 10/18/14 [id number]  Gwyneth SproutWhitney Plunkett,  MD 10/19/14 [id number]

## 2014-10-18 NOTE — ED Notes (Signed)
Alert, calm, interactive, eyes open, acknowledges understanding of plan. tolerating vent, following commands.

## 2014-10-18 NOTE — ED Notes (Signed)
HOB raised to 30 degrees 

## 2014-10-18 NOTE — ED Notes (Signed)
[facility] pending report to inpt bed assigned. VSS.

## 2014-10-18 NOTE — Progress Notes (Signed)
eLink Physician-Brief Progress Note Patient Name: Blondell RevealMelinda S Comella DOB: 1966-11-05 MRN: [id number]   Date of Service  10/18/2014  HPI/Events of Note   Consulted by ED for admission. [age] F with seizure DO and presumed OD after being found unresponsive by family. Possible cardiac arrest while at home. Intubated for airway protection.   eICU Interventions   Floor MD to admit.        Intervention Category Minor Interventions: Communication with other healthcare providers and/or family  Meyah Corle R. 10/18/2014, 5:17 PM

## 2014-10-18 NOTE — H&P (Signed)
PULMONARY / CRITICAL CARE MEDICINE   Name: Shannon RevealMelinda S [name] MRN: [id number] DOB: 1966-10-02    ADMISSION DATE:  10/18/2014 CONSULTATION DATE:  10/18/2014  REFERRING MD :  Gwyneth SproutWhitney Plunkett EDP  CHIEF COMPLAINT:  overdose  INITIAL PRESENTATION: 48 year old female with a past medical history significant for depression was [facility] after a drug overdose requiring intubation and mechanical ventilation.  STUDIES:  November 14 CT head> No acute intracranial process, no acute C spine bone injury, paranasal sinuses are filled with fluid  SIGNIFICANT EVENTS:   HISTORY OF PRESENT ILLNESS:  This is a 48 year old female with a past medical history significant for depression who has had multiple suicide attempts in the past who [facility]2014 after a presumed drug overdose.the patient's husband states that she does not abuse alcohol or illicit drugs and she only takes medications which are prescribed to her. She has "chronic depression" but of some of late she has not been feeling worse than normal. She was last seen normal on Friday evening. Her husband saw her sleeping on 10/18/2014 at around 7 AM. However she was next found on 10/18/2014 at 3:30 PM in the bathroom by her son. She was breathing but was unresponsive. 911 was called and the fire department did not feel a pulse and so they initiated CPR for a very brief period of time. Shortly thereafter EMS arrived and found her to have a pulse. They administered glucose, calcium, and bicarbonate. She was transcutaneously paced briefly en route to the emergency department. In the emergency room she was found to have a normal blood pressure and a spontaneous rhythm which was narrow complex on telemetry. Transcutaneous pacing was held. She had been intubated in route to the emergency department by EMS after a vomiting episode. In the emergency department  she had a CT head. The family reports that she takes Ambien as well as Xanax at home. Her husband states that he typically tries to hide these pills from her but apparently she found them today. He estimates that she may have taken as many as 30 Ambien and 13 0.5 mg tablets of Xanax.  PAST MEDICAL HISTORY :   has a past medical history of Epilepsy; Anxiety; Depression; Insomnia; Headache(784.0); Hypertension; Suicidal ideation; Obsessive compulsive disorder; Arthritis; Gallstones; Hemorrhoids; and Chronic constipation (01/10/2014).  has past surgical history that includes Abdominal hysterectomy (2001); Cholecystectomy; Breast lumpectomy (Left, 01/2007); Tonsillectomy; Cesarean section; Appendectomy (Right); and Hemorrhoid banding (2015). Prior to Admission medications   Medication Sig Start Date End Date Taking? Authorizing Provider  alprazolam Prudy Feeler(XANAX) 2 MG tablet TAKE 1 TABLET BY MOUTH 3 TIMES A DAY AS NEEDED    Nelwyn SalisburyStephen A Fry, MD  azithromycin [facility](ZITHROMAX) 250 MG tablet As directed 09/01/14   Nelwyn SalisburyStephen A Fry, MD  HYDROcodone-acetaminophen (NORCO/VICODIN) 5-325 MG per tablet Take 1 tablet by mouth every 6 (six) hours as needed. 10/08/14   York Spanielharles K Willis, MD  lamoTRIgine (LAMICTAL) 25 MG tablet Take 2 tablets (50 mg total) by mouth daily. 10/06/14   Nelwyn SalisburyStephen A Fry, MD  metoprolol succinate (TOPROL-XL) 100 MG 24 hr tablet Take 1 tablet (100 mg total) by mouth daily. Take with or immediately following a meal. 09/10/14   Nelwyn SalisburyStephen A Fry, MD  metoprolol succinate (TOPROL-XL) 50 MG 24 hr tablet  09/01/14   Historical Provider, MD  pregabalin (LYRICA) 75 MG capsule Take 1 capsule (75 mg total) by mouth 2 (two) times daily.  10/03/14   York Spanielharles K Willis, MD  PREMARIN 0.625 MG tablet TAKE 1 TABLET DAILY FOR 21 DAYS THEN DO NOT TAKE FOR 7 DAYS. 02/10/14   Nelwyn SalisburyStephen A Fry, MD  temazepam (RESTORIL) 30 MG capsule  08/25/14   Historical Provider, MD  zolpidem (AMBIEN) 10 MG tablet Take 2 tablets (20 mg total) by mouth at bedtime as  needed for sleep. 09/10/14 10/10/14  Nelwyn SalisburyStephen A Fry, MD  zonisamide (ZONEGRAN) 100 MG capsule Take 3 capsules (300 mg total) by mouth 2 (two) times daily. 03/27/14   York Spanielharles K Willis, MD   Allergies  Allergen Reactions  . Vimpat [Lacosamide]     Increased depression    FAMILY HISTORY:  indicated that her mother is alive. She indicated that her father is alive. She indicated that both of her brothers are alive.  SOCIAL HISTORY:  reports that she has never smoked. She has never used smokeless tobacco. She reports that she drinks alcohol. She reports that she does not use illicit drugs.  REVIEW OF SYSTEMS:  Cannot obtain due to intubation  SUBJECTIVE:   VITAL SIGNS: Temp:  [90.3 F (32.4 C)-95.7 F (35.4 C)] 95.7 F (35.4 C) (11/14 1800) Pulse Rate:  [55-73] 56 (11/14 1800) Resp:  [11-19] 14 (11/14 1800) BP: (100-118)/(60-71) 100/60 mmHg (11/14 1800) SpO2:  [100 %] 100 % (11/14 1800) FiO2 (%):  [40 %-100 %] 40 % (11/14 1710) HEMODYNAMICS:   VENTILATOR SETTINGS: Vent Mode:  [-] PRVC FiO2 (%):  [40 %-100 %] 40 % Set Rate:  [14 bmp] 14 bmp PEEP:  [5 cmH20] 5 cmH20 Plateau Pressure:  [15 cmH20] 15 cmH20 INTAKE / OUTPUT:  Intake/Output Summary (Last 24 hours) at 10/18/14 1829 Last data filed at 10/18/14 1745  Gross per 24 hour  Intake    500 ml  Output    425 ml  Net     75 ml    PHYSICAL EXAMINATION: General:  Sedated on vent Neuro:  GCS 3 HEENT:  NCAT, PERRL, ETT Cardiovascular:  RRR, no mgr Lungs:  CTA B Abdomen:  BS+, soft, nontender Musculoskeletal:  Normal bulk and tone Skin:  No skin breakdown  LABS:  CBC  Recent Labs Lab 10/18/14 1627  WBC 9.0  HGB 12.0  HCT 36.5  PLT 204   Coag's No results for input(s): APTT, INR in the last 168 hours. BMET  Recent Labs Lab 10/18/14 1627  NA 139  K 3.5*  CL 107  CO2 20  BUN 25*  CREATININE 1.50*  GLUCOSE 189*   Electrolytes  Recent Labs Lab 10/18/14 1627  CALCIUM 8.3*   Sepsis Markers  Recent  Labs Lab 10/18/14 1637  LATICACIDVEN 1.87   ABG  Recent Labs Lab 10/18/14 1639  PHART 7.337*  PCO2ART 40.6  PO2ART 394.0*   Liver Enzymes  Recent Labs Lab 10/18/14 1627  AST 20  ALT 12  ALKPHOS 67  BILITOT 0.3  ALBUMIN 3.0*   Cardiac Enzymes No results for input(s): TROPONINI, PROBNP in the last 168 hours. Glucose  Recent Labs Lab 10/18/14 1559  GLUCAP 195*    Imaging No results found.  10/18/2014 chest x-ray reviewed lung fields are clear ET tube is in place 10/18/2014 EKG normal sinus rhythm, QTC is 496 no ischemic changes  ASSESSMENT / PLAN:  NEUROLOGIC A:  Acute encephalopathy secondary to overdose of benzodiazepine as well as Ambien It should be noted that we do not know exactly what this patient took, however we are reassured that there is no  toxic levels of acetaminophen or salicylates measured. Further, there is no other toxidrome suggested by her current clinical presentation. Presumably, this was a suicide attempt. At this point we do not know if she has had anoxic brain injury. Poison control was notified of the overdose and at this point we will continue with supportive care alone while carefully monitoring her QTc interval. P:   RASS goal: -1 -intermittent fentanyl as needed for vent synchrony  PULMONARY OETT November 14>  A:acute respiratory failure with hypoxemia secondary to inability to protect airway in the setting of acute encephalopathy P:   -full ventilatory support -Daily chest x-ray -Daily spontaneous breathing trial and wakeup assessment  CARDIOVASCULAR CVL not indicated A:  Mild sinus bradycardia likely related to overdose QTC mildly prolonged at 496, again likely related to overdose as well as bradycardia P:  -telemetry monitoring with continuous QTC monitoring -Monitor magnesium, potassium -Twelve-lead EKG every shift  RENAL A:  No acute issues. P:   -Monitor BMET and UOP -Replace electrolytes as  needed    GASTROINTESTINAL A:  vomiting P:   -OG tube suction -When necessary Zofran  HEMATOLOGIC A:  No acute issues P:  -Monitor for bleeding  INFECTIOUS A:  No acute issues P:   -Monitor fever curve, white blood cell count  ENDOCRINE A:  No acute issues   P:   -monitor glucose    FAMILY  - Updates: husband, mother, and sister updated at bedside by me   TODAY'S SUMMARY: 48 year old female with an apparent suicide attempt. GCS score 3, intubated. Will provide supportive care and monitor QTc interval closely.    CC time 45 minutes  Heber Sparta, MD [place] [facility] Pager: [phone] Cell: [phone] If no response, call [phone]   10/18/2014, 6:29 PM

## 2014-10-18 NOTE — ED Notes (Addendum)
Received pt via EMS from home with c/o found unresponsive by family, CPR started. Unknown downtime and unknown amount of time CPR performed. Upon arrival of EMS 1st paramedic had pt on demand pacer set at 70. Pt given 1 amp of sodium bicarb, 2 mg of glucagon, 4 mg of narcan by EMS. Pt vomited during intubation by EMS. Upon arrival to ED, pt noted to have pulses with narrow complex, pacer turned off per Dr Hyacinth MeekerMiller Orders. Pt LSN by husband at 0300.

## 2014-10-18 NOTE — Progress Notes (Signed)
   10/18/14 1625  Clinical Encounter Type  Visited With Family;Health care provider  Visit Type ED;Critical Care;Spiritual support  Stress Factors  Family Stress Factors Health changes;Lack of knowledge   Chaplain responded to "post-cpr" page. Chaplain met family and brought them to consultation room, notified dr, and facilitated communication between dr and family. Chaplain offered hospitality. Chaplain accompanied spouse, mother, and father to trauma bay to visit pt, and then communicated with other family who were still waiting in consult room. Family distressed by not knowing yet what exactly happened to pt (They are unsure if it was intentional overdose or seizure). Family seemed okay and were supporting themselves when chaplain left. Will follow as needed.   Vanetta Mulders 10/18/2014 4:55 PM

## 2014-10-18 NOTE — ED Notes (Signed)
Family reported to Paramedic student that pt has history of suicidal attempts and that the husband reports that the sleeping medication that he keeps away from pt was missing more than should be.

## 2014-10-19 ENCOUNTER — Inpatient Hospital Stay (HOSPITAL_COMMUNITY): Payer: BC Managed Care – PPO

## 2014-10-19 ENCOUNTER — Other Ambulatory Visit: Payer: Self-pay

## 2014-10-19 DIAGNOSIS — R4182 Altered mental status, unspecified: Secondary | ICD-10-CM | POA: Insufficient documentation

## 2014-10-19 DIAGNOSIS — G9340 Encephalopathy, unspecified: Secondary | ICD-10-CM

## 2014-10-19 DIAGNOSIS — T50902D Poisoning by unspecified drugs, medicaments and biological substances, intentional self-harm, subsequent encounter: Secondary | ICD-10-CM

## 2014-10-19 DIAGNOSIS — X838XXA Intentional self-harm by other specified means, initial encounter: Secondary | ICD-10-CM

## 2014-10-19 LAB — BASIC METABOLIC PANEL
Anion gap: 13 (ref 5–15)
Anion gap: 17 — ABNORMAL HIGH (ref 5–15)
BUN: 18 mg/dL (ref 6–23)
BUN: 16 mg/dL (ref 6–23)
CO2: 19 meq/L (ref 19–32)
CO2: 15 mEq/L — ABNORMAL LOW (ref 19–32)
Calcium: 8.1 mg/dL — ABNORMAL LOW (ref 8.4–10.5)
Calcium: 8.5 mg/dL (ref 8.4–10.5)
Chloride: 110 mEq/L (ref 96–112)
Chloride: 105 mEq/L (ref 96–112)
Creatinine, Ser: 1.13 mg/dL — ABNORMAL HIGH (ref 0.50–1.10)
Creatinine, Ser: 0.86 mg/dL (ref 0.50–1.10)
GFR calc Af Amer: 66 mL/min — ABNORMAL LOW (ref >90)
GFR calc Af Amer: >90 mL/min (ref >90)
GFR calc non Af Amer: 57 mL/min — ABNORMAL LOW (ref >90)
GFR, EST NON AFRICAN AMERICAN: 79 mL/min — AB (ref >90)
Glucose, Bld: 83 mg/dL (ref 70–99)
Glucose, Bld: 155 mg/dL — ABNORMAL HIGH (ref 70–99)
POTASSIUM: 3.9 meq/L (ref 3.7–5.3)
Potassium: 4.3 mEq/L (ref 3.7–5.3)
SODIUM: 137 meq/L (ref 137–147)
Sodium: 142 mEq/L (ref 137–147)

## 2014-10-19 LAB — CBC
HCT: 36.7 % (ref 36.0–46.0)
Hemoglobin: 11.9 g/dL — ABNORMAL LOW (ref 12.0–15.0)
MCH: 33 pg (ref 26.0–34.0)
MCHC: 32.4 g/dL (ref 30.0–36.0)
MCV: 101.7 fL — ABNORMAL HIGH (ref 78.0–100.0)
PLATELETS: 208 10*3/uL (ref 150–400)
RBC: 3.61 MIL/uL — ABNORMAL LOW (ref 3.87–5.11)
RDW: 12.8 % (ref 11.5–15.5)
WBC: 15.1 10*3/uL — ABNORMAL HIGH (ref 4.0–10.5)

## 2014-10-19 LAB — MAGNESIUM
MAGNESIUM: 1.7 mg/dL (ref 1.5–2.5)
Magnesium: 1.7 mg/dL (ref 1.5–2.5)

## 2014-10-19 LAB — PHOSPHORUS: Phosphorus: 2.7 mg/dL (ref 2.3–4.6)

## 2014-10-19 LAB — GLUCOSE, CAPILLARY: GLUCOSE-CAPILLARY: 122 mg/dL — AB (ref 70–99)

## 2014-10-19 MED ORDER — LEVETIRACETAM IN NACL 500 MG/100ML IV SOLN
500.0000 mg | Freq: Two times a day (BID) | INTRAVENOUS | Status: DC
  Administered 2014-10-20 – 2014-10-21 (×3): 500 mg via INTRAVENOUS
  Filled 2014-10-19 (×4): qty 100

## 2014-10-19 MED ORDER — LORAZEPAM 2 MG/ML IJ SOLN
1.0000 mg | INTRAMUSCULAR | Status: DC | PRN
  Administered 2014-10-19: 1 mg via INTRAVENOUS
  Filled 2014-10-19 (×2): qty 1

## 2014-10-19 MED ORDER — LEVETIRACETAM IN NACL 500 MG/100ML IV SOLN: 500.0000 mg | Freq: Two times a day (BID) | INTRAVENOUS | Status: DC

## 2014-10-19 MED ORDER — CHLORHEXIDINE GLUCONATE 0.12 % MT SOLN: 15.0000 mL | Freq: Two times a day (BID) | OROMUCOSAL | Status: DC

## 2014-10-19 MED ORDER — LEVETIRACETAM IN NACL 1000 MG/100ML IV SOLN
1000.0000 mg | INTRAVENOUS | Status: AC
  Administered 2014-10-19: 1000 mg via INTRAVENOUS
  Filled 2014-10-19: qty 100

## 2014-10-19 MED ORDER — CETYLPYRIDINIUM CHLORIDE 0.05 % MT LIQD: 7.0000 mL | Freq: Two times a day (BID) | OROMUCOSAL | Status: DC

## 2014-10-19 MED ORDER — ZONISAMIDE 100 MG PO CAPS: 300.0000 mg | ORAL_CAPSULE | Freq: Two times a day (BID) | ORAL | Status: DC

## 2014-10-19 MED ORDER — LORAZEPAM 2 MG/ML IJ SOLN
INTRAMUSCULAR | Status: AC
  Filled 2014-10-19: qty 1

## 2014-10-19 MED ORDER — CHLORHEXIDINE GLUCONATE 0.12 % MT SOLN
OROMUCOSAL | Status: AC
  Administered 2014-10-19: 15 mL
  Filled 2014-10-19: qty 15

## 2014-10-19 MED ORDER — LORAZEPAM BOLUS VIA INFUSION: 1.0000 mg | INTRAVENOUS | Status: DC | PRN

## 2014-10-19 MED ORDER — LAMOTRIGINE 25 MG PO TABS
50.0000 mg | ORAL_TABLET | Freq: Every day | ORAL | Status: DC
  Administered 2014-10-20 – 2014-10-24 (×5): 50 mg via ORAL
  Filled 2014-10-19 (×6): qty 2

## 2014-10-19 MED ORDER — ZONISAMIDE 100 MG PO CAPS
300.0000 mg | ORAL_CAPSULE | Freq: Two times a day (BID) | ORAL | Status: DC
  Administered 2014-10-20 (×2): 300 mg via ORAL
  Filled 2014-10-19 (×5): qty 3

## 2014-10-19 NOTE — Progress Notes (Signed)
[facility]  Patient noted to have convulsive activity witnessed by bedside CMA, not RN or MD. On my exam: Lethargic Intermittently tracks me with eyes Withdraws to pain Nods to my questions  I am not witnessing seizure activity on exam as she is withdrawing to pain and intermittently tracking me.  Impression: Acute encephalopathy, uncertain etiology, currently protecting airway, normal vital signs Favor pseudoseizure but will work up for other acute cause> hemorrhagic stroke, seizure, etc  Plan: CT head stat EEG stat Restart home seizure  Heber CarolinaBrent McQuaid, MD [place] [place] [facility] Pager: [phone] Cell: [phone] If no response, call [phone]

## 2014-10-19 NOTE — Progress Notes (Signed)
Patient started to experience some seizure like activity. CBG checked 121.

## 2014-10-19 NOTE — Progress Notes (Signed)
eLink Physician-Brief Progress Note Patient Name: Shannon OvensMelinda S [name] DOB: [birth year] MRN: [id number]   Date of Service  10/19/2014  HPI/Events of Note  Dr. Ferne Coeeynold's called with EEG results.  Pt not in non-convulsive status but does have sharp activity.  eICU Interventions  Restarted home zonegran, lamictal was restarted this am.      Intervention Category Major Interventions: Seizures - evaluation and management  Joni Colegrove K. 10/19/2014, 4:57 PM

## 2014-10-19 NOTE — Procedures (Signed)
EEG report.  Brief clinical history:  [age] year old female with a past medical history significant for depression was [facility]18[facility] after a drug overdose requiring intubation and mechanical ventilation. Had a history of epileptic seizures.  Technique: this is a 17 channel routine scalp EEG performed at the bedside with bipolar and monopolar montages arranged in accordance to the international 10/20 system of electrode placement. One channel was dedicated to EKG recording.  The patient remains encephalopathic throughout the study  No activating procedures performed.  Description: as the study begins and throughout the entire recording, there is generalized, continuous, monomorphic, reactive theta activity that doesn't follow an ictal pattern at any time. Intermixed, infrequent high amplitude sharp waves involving the C4, P4 electrodes were noted. No electrographic seizures noted.  EKG showed sinus rhythm.  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 No electrographic seizures noted. Clinical correlation is advised.   Wyatt Portelasvaldo Camilo, MD

## 2014-10-19 NOTE — Progress Notes (Signed)
Patient extubated and has been alert/oriented x4. She is pleasant and coorperative. Family at the bedside.

## 2014-10-19 NOTE — Procedures (Signed)
Extubation Procedure Note  Patient Details:   Name: [name] DOB: Feb 06, 1966 MRN: [id number]   Airway Documentation:     Evaluation  O2 sats: stable throughout Complications: No apparent complications Patient did tolerate procedure well. Bilateral Breath Sounds: Diminished, Clear Suctioning: Oral, Airway Yes   Patient extubated to 4 LNC at this time per MD order. Pateint able to vocalize and clear secretions. RT will continue to monitor.  Lurlean LeydenDick, Robb Sibal Bailey 10/19/2014, 9:08 AM

## 2014-10-19 NOTE — Progress Notes (Signed)
Stat EEG completed. Results Pending.

## 2014-10-19 NOTE — Progress Notes (Signed)
Patient continues to be very lethargic. Temperature has dropped and warming blanket applied. She becomes bradycardic when she falls off to sleep, but HR increases with stimulation. Parents remain at bedside to keep her aroused. Have been unable to safely give PO medication. Patient unable to stay awake. Will continue to monitor patient.

## 2014-10-19 NOTE — Progress Notes (Signed)
[facility]  Case reviewed with Dr. Thad Rangereynolds and Dr. Belinda BlockGiddings.  Not in status epilepticus, but multiple sharp waves, at risk for seizure. Cannot take by mouth right now  Start IV Keppra  Shannon CarolinaBrent Vieno Tarrant, MD [place] [facility] Pager: [phone] Cell: [phone] If no response, call [phone]

## 2014-10-19 NOTE — Progress Notes (Signed)
PULMONARY / CRITICAL CARE MEDICINE   Name: [name] MRN: [id number] DOB: [birth year]    ADMISSION DATE:  10/18/2014 CONSULTATION DATE:  10/18/2014  REFERRING MD :  Gwyneth SproutWhitney Plunkett EDP  CHIEF COMPLAINT:  overdose  INITIAL PRESENTATION: 48 year old female with a past medical history significant for depression was [facility]18[facility] after a drug overdose requiring intubation and mechanical ventilation.  STUDIES:  November 14 CT head> No acute intracranial process, no acute C spine bone injury, paranasal sinuses are filled with fluid  SIGNIFICANT EVENTS:  SUBJECTIVE: awake , following commands    VITAL SIGNS: Temp:  [90.3 F (32.4 C)-101.5 F (38.6 C)] 100.5 F (38.1 C) (11/15 0740) Pulse Rate:  [53-96] 96 (11/15 0700) Resp:  [10-25] 16 (11/15 0700) BP: (91-175)/(49-80) 142/68 mmHg (11/15 0700) SpO2:  [99 %-100 %] 100 % (11/15 0700) FiO2 (%):  [40 %-100 %] 40 % (11/15 0400) Weight:  [46.6 kg (102 lb 11.8 oz)-47.5 kg (104 lb 11.5 oz)] 46.6 kg (102 lb 11.8 oz) (11/15 0432) HEMODYNAMICS:   VENTILATOR SETTINGS: Vent Mode:  [-] PRVC FiO2 (%):  [40 %-100 %] 40 % Set Rate:  [14 bmp] 14 bmp Vt Set:  [440 mL] 440 mL PEEP:  [5 cmH20] 5 cmH20 Plateau Pressure:  [15 cmH20] 15 cmH20 INTAKE / OUTPUT:  Intake/Output Summary (Last 24 hours) at 10/19/14 0753 Last data filed at 10/19/14 0700  Gross per 24 hour  Intake 1001.67 ml  Output   1235 ml  Net -233.33 ml    PHYSICAL EXAMINATION: General:  Awake , on vent  Neuro: a/o x 3 , f/c  HEENT:  NCAT, PERRL, ETT Cardiovascular:  RRR, no mgr Lungs:  CTA B Abdomen:  BS+, soft, nontender Musculoskeletal:  Normal bulk and tone, MAEW x 4  Skin:  No skin breakdown  LABS:  CBC  Recent Labs Lab 10/18/14 1627 10/19/14 0320  WBC 9.0 15.1*  HGB 12.0 11.9*  HCT 36.5 36.7  PLT 204 208   Coag's No results for input(s): APTT, INR in the last 168 hours. BMET  Recent  Labs Lab 10/18/14 1627 10/19/14 0320  NA 139 142  K 3.5* 4.3  CL 107 110  CO2 20 19  BUN 25* 18  CREATININE 1.50* 1.13*  GLUCOSE 189* 83   Electrolytes  Recent Labs Lab 10/18/14 1627 10/18/14 2038 10/19/14 0320  CALCIUM 8.3*  --  8.1*  MG  --  1.8 1.7  PHOS  --   --  2.7   Sepsis Markers  Recent Labs Lab 10/18/14 1637  LATICACIDVEN 1.87   ABG  Recent Labs Lab 10/18/14 1639  PHART 7.337*  PCO2ART 40.6  PO2ART 394.0*   Liver Enzymes  Recent Labs Lab 10/18/14 1627  AST 20  ALT 12  ALKPHOS 67  BILITOT 0.3  ALBUMIN 3.0*   Cardiac Enzymes No results for input(s): TROPONINI, PROBNP in the last 168 hours. Glucose  Recent Labs Lab 10/18/14 1559 10/18/14 2004  GLUCAP 195* 101*    Imaging Ct Head Wo Contrast  10/18/2014   CLINICAL DATA:  Post CPR, history of seizures, altered mental status  EXAM: CT HEAD WITHOUT CONTRAST  CT CERVICAL SPINE WITHOUT CONTRAST  TECHNIQUE: Multidetector CT imaging of the head and cervical spine was performed following the standard protocol without intravenous contrast. Multiplanar CT image reconstructions of the cervical spine were also generated.  COMPARISON:  None.  FINDINGS: CT HEAD FINDINGS  No skull fracture is noted. Mastoid air  cells are unremarkable. There is mucosal thickening with partial opacification bilateral ethmoid air cells. Mucosal thickening with air-fluid level noted bilateral sphenoid sinus. Abundant secretions are noted in nasopharynx.  No intracranial hemorrhage, mass effect or midline shift. The gray and white-matter differentiation is preserved. No acute cortical infarction. No mass lesion is noted on this unenhanced scan.  CT CERVICAL SPINE FINDINGS  Axial images shows no acute fracture or subluxation. Computer processed images shows cervical spine shows alignment, disc spaces and vertebral body heights to be preserved. No acute fracture or subluxation. Endotracheal and NG tube in place is noted.  There is no  pneumothorax in visualized lung apices. Bilateral apical Mild pleural parenchymal scarring.  IMPRESSION: 1. No acute intracranial abnormality. 2. There is mucosal thickening with partial opacification bilateral ethmoid air cells. Mucosal thickening with air-fluid level bilateral sphenoid sinus. Abundant secretions are noted in nasopharynx. 3. No cervical spine acute fracture or subluxation. NG tube and endotracheal tube partially visualized.   Electronically Signed   By: Natasha Mead M.D.   On: 10/18/2014 17:17   Ct Cervical Spine Wo Contrast  10/18/2014   CLINICAL DATA:  Post CPR, history of seizures, altered mental status  EXAM: CT HEAD WITHOUT CONTRAST  CT CERVICAL SPINE WITHOUT CONTRAST  TECHNIQUE: Multidetector CT imaging of the head and cervical spine was performed following the standard protocol without intravenous contrast. Multiplanar CT image reconstructions of the cervical spine were also generated.  COMPARISON:  None.  FINDINGS: CT HEAD FINDINGS  No skull fracture is noted. Mastoid air cells are unremarkable. There is mucosal thickening with partial opacification bilateral ethmoid air cells. Mucosal thickening with air-fluid level noted bilateral sphenoid sinus. Abundant secretions are noted in nasopharynx.  No intracranial hemorrhage, mass effect or midline shift. The gray and white-matter differentiation is preserved. No acute cortical infarction. No mass lesion is noted on this unenhanced scan.  CT CERVICAL SPINE FINDINGS  Axial images shows no acute fracture or subluxation. Computer processed images shows cervical spine shows alignment, disc spaces and vertebral body heights to be preserved. No acute fracture or subluxation. Endotracheal and NG tube in place is noted.  There is no pneumothorax in visualized lung apices. Bilateral apical Mild pleural parenchymal scarring.  IMPRESSION: 1. No acute intracranial abnormality. 2. There is mucosal thickening with partial opacification bilateral ethmoid  air cells. Mucosal thickening with air-fluid level bilateral sphenoid sinus. Abundant secretions are noted in nasopharynx. 3. No cervical spine acute fracture or subluxation. NG tube and endotracheal tube partially visualized.   Electronically Signed   By: Natasha Mead M.D.   On: 10/18/2014 17:17   Dg Chest Port 1 View  10/18/2014   CLINICAL DATA:  intubation, post cardiopulmonary resuscitation  EXAM: PORTABLE CHEST - 1 VIEW  COMPARISON:  01/04/2007  FINDINGS: Cardiomediastinal silhouette is stable. Mild hyperinflation. Endotracheal tube in place with tip 2 cm above the carina. NG tube in place with tip in mid stomach. No acute infiltrate or pulmonary edema. No pneumothorax.  IMPRESSION: No active disease.  Endotracheal tube in place.  No pneumothorax.   Electronically Signed   By: Natasha Mead M.D.   On: 10/18/2014 17:12    10/18/2014 chest x-ray reviewed lung fields are clear ET tube is in place 10/18/2014 EKG normal sinus rhythm, QTC is 496 no ischemic changes 10/19/14 EKG NSR , QTC 443 , no acute changes   ASSESSMENT / PLAN:  NEUROLOGIC A:  Acute encephalopathy secondary to overdose of benzodiazepine as well as Ambien It should be  noted that we do not know exactly what this patient took, however we are reassured that there is no toxic levels of acetaminophen or salicylates measured. Further, there is no other toxidrome suggested by her current clinical presentation. Presumably, this was a suicide attempt. Seizure Disorder -home Lamictal  CT head >no acute process,  11/15>improved, following commands, appropriate  P:   Plan to extubate  Will need Psych consult  Plan to restart Lamictal   PULMONARY OETT November 14>  A:acute respiratory failure with hypoxemia secondary to inability to protect airway in the setting of acute encephalopathy P:   - Plan to extubate -O2 to keep sat >90%  CARDIOVASCULAR CVL not indicated A:  Mild sinus bradycardia likely related to overdose>resolved  QTC  mildly prolonged at 496, again likely related to overdose as well as bradycardia>improving  P:  -telemetry monitoring with continuous QTC monitoring -Monitor magnesium, potassium -Twelve-lead EKG in am   RENAL A:   Acute Renal Failure suspect d/t hypovolemia >improved  P:   -Monitor BMET and UOP -Replace electrolytes as needed    GASTROINTESTINAL A:  Vomiting>resolved  P:   - Advance diet as tolerated  -zofran .Marland Kitchen.rpn   HEMATOLOGIC A:  No acute issues P:  -Monitor for bleeding  INFECTIOUS A:  Fever/WBC bump  ?aspiration  Urinalysis normal P:   -Monitor fever curve, white blood cell count -Check CXR   ENDOCRINE A:  No acute issues   P:   -monitor glucose    FAMILY  - Update: son and mother updated 11/15 .    TODAY'S SUMMARY: 48 year old female with an apparent suicide attempt requiring intubation. She is awake and following commands. Will plan for extubation.    Attending:  I have seen and examined the patient with nurse practitioner/resident and agree with the note above.   She was initially doing great this morning and was appropriate and interactive and conversant after extubation.  On exam she is protecting her airway, glucose is normal, and she withdraws to pain.  See my note later today. Monitor fever curve, check CXR now.   My cc time 40 minutes  Heber CarolinaBrent Nysia Dell, MD [place] [facility] Pager: [phone] Cell: [phone] If no response, call [phone]   10/19/2014, 7:53 AM

## 2014-10-20 ENCOUNTER — Inpatient Hospital Stay (HOSPITAL_COMMUNITY): Payer: BC Managed Care – PPO

## 2014-10-20 DIAGNOSIS — T50902S Poisoning by unspecified drugs, medicaments and biological substances, intentional self-harm, sequela: Secondary | ICD-10-CM

## 2014-10-20 LAB — CBC
HCT: 35.6 % — ABNORMAL LOW (ref 36.0–46.0)
Hemoglobin: 11.8 g/dL — ABNORMAL LOW (ref 12.0–15.0)
MCH: 32.8 pg (ref 26.0–34.0)
MCHC: 33.1 g/dL (ref 30.0–36.0)
MCV: 98.9 fL (ref 78.0–100.0)
Platelets: 208 10*3/uL (ref 150–400)
RBC: 3.6 MIL/uL — ABNORMAL LOW (ref 3.87–5.11)
RDW: 13.1 % (ref 11.5–15.5)
WBC: 17.8 10*3/uL — ABNORMAL HIGH (ref 4.0–10.5)

## 2014-10-20 LAB — BASIC METABOLIC PANEL
Anion gap: 17 — ABNORMAL HIGH (ref 5–15)
Anion gap: 14 (ref 5–15)
BUN: 19 mg/dL (ref 6–23)
BUN: 15 mg/dL (ref 6–23)
CO2: 14 mEq/L — ABNORMAL LOW (ref 19–32)
CO2: 18 mEq/L — ABNORMAL LOW (ref 19–32)
CREATININE: 0.95 mg/dL (ref 0.50–1.10)
Calcium: 9.2 mg/dL (ref 8.4–10.5)
Calcium: 9.3 mg/dL (ref 8.4–10.5)
Chloride: 110 mEq/L (ref 96–112)
Chloride: 107 mEq/L (ref 96–112)
Creatinine, Ser: 0.94 mg/dL (ref 0.50–1.10)
GFR calc Af Amer: 82 mL/min — ABNORMAL LOW (ref >90)
GFR calc non Af Amer: 70 mL/min — ABNORMAL LOW (ref >90)
GFR, EST AFRICAN AMERICAN: 81 mL/min — AB (ref >90)
GFR, EST NON AFRICAN AMERICAN: 71 mL/min — AB (ref >90)
Glucose, Bld: 98 mg/dL (ref 70–99)
Glucose, Bld: 168 mg/dL — ABNORMAL HIGH (ref 70–99)
Potassium: 4.9 mEq/L (ref 3.7–5.3)
Potassium: 3.4 mEq/L — ABNORMAL LOW (ref 3.7–5.3)
SODIUM: 139 meq/L (ref 137–147)
Sodium: 141 mEq/L (ref 137–147)

## 2014-10-20 LAB — MAGNESIUM: Magnesium: 2.1 mg/dL (ref 1.5–2.5)

## 2014-10-20 LAB — LACTIC ACID, PLASMA: LACTIC ACID, VENOUS: 1.1 mmol/L (ref 0.5–2.2)

## 2014-10-20 LAB — TSH: TSH: 0.243 u[IU]/mL — ABNORMAL LOW (ref 0.350–4.500)

## 2014-10-20 LAB — AMMONIA: AMMONIA: 28 umol/L (ref 11–60)

## 2014-10-20 MED ORDER — ENSURE COMPLETE PO LIQD
237.0000 mL | Freq: Three times a day (TID) | ORAL | Status: DC
  Administered 2014-10-20 – 2014-10-24 (×5): 237 mL via ORAL

## 2014-10-20 MED ORDER — CETYLPYRIDINIUM CHLORIDE 0.05 % MT LIQD
7.0000 mL | Freq: Two times a day (BID) | OROMUCOSAL | Status: DC
  Administered 2014-10-20 (×2): 7 mL via OROMUCOSAL

## 2014-10-20 MED ORDER — LORAZEPAM 2 MG/ML IJ SOLN
2.0000 mg | Freq: Once | INTRAMUSCULAR | Status: AC
  Administered 2014-10-20: 2 mg via INTRAVENOUS

## 2014-10-20 MED ORDER — PREGABALIN 75 MG PO CAPS
75.0000 mg | ORAL_CAPSULE | Freq: Two times a day (BID) | ORAL | Status: DC
  Administered 2014-10-20 – 2014-10-24 (×8): 75 mg via ORAL
  Filled 2014-10-20 (×2): qty 3
  Filled 2014-10-20 (×2): qty 1
  Filled 2014-10-20 (×2): qty 3
  Filled 2014-10-20: qty 1
  Filled 2014-10-20: qty 3

## 2014-10-20 NOTE — Procedures (Addendum)
History: 48 yo F with seizure disorder and recurrent episodes of altered mental status.   Sedation: None  Patient state: awake, alert, interactive and answering questions and following commands.   Technique: This is a 21 channel routine scalp EEG performed at the bedside with bipolar and monopolar montages arranged in accordance to the international 10/20 system of electrode placement. One channel was dedicated to EKG recording. This report covers an interrupted recording from 2:54pm until 3:31 pm as well as 4:17 pm until 4:39 pm   Background: The background is moderately disorganized with irregular theta and delta activity. A Posterior dominant rhythm of 9 Hz can be seen overlying this at times. In addition there are frequent sharp waves seen at P8 > O2 > P4. There are some runs of quasi-rhythmic theta activity without clear evolution and an ativan trial was performed without convincing change in the EEG or clinical status of the patient.   Photic stimulation: Physiologic driving is not performed  EEG Abnormalities: 1) Frequent right parieto-occipital sharp waves 2) Generalized irregular slow activity.   Clinical Interpretation: This EEG is consistent with an area of potential epileptogenicity in the right parieto-occipital region. There was no seizure recorded on this study.   Ritta SlotMcNeill Deldrick Linch, MD [facility] [phone]  If 7pm- 7am, please page neurology on call as listed in AMION.

## 2014-10-20 NOTE — Progress Notes (Signed)
PULMONARY / CRITICAL CARE MEDICINE   Name: Philipp OvensMelinda S XXXLechner MRN: [id number] DOB: 1966-06-08    ADMISSION DATE:  10/18/2014 CONSULTATION DATE:  10/18/2014  REFERRING MD :  Gwyneth SproutWhitney Plunkett EDP  CHIEF COMPLAINT:  overdose  INITIAL PRESENTATION: 48 year old female with a past medical history significant for depression was [facility] 10[facility]18/[facility] after a drug overdose requiring intubation and mechanical ventilation.  STUDIES:  November 14 CT head> No acute intracranial process, no acute C spine bone injury, paranasal sinuses are filled with fluid C Thead 11/15>>>neg SIGNIFICANT EVENTS:  SUBJECTIVE: walked the unit  VITAL SIGNS: Temp:  [96.7 F (35.9 C)-101.3 F (38.5 C)] 98 F (36.7 C) (11/16 1210) Pulse Rate:  [52-90] 89 (11/16 1000) Resp:  [9-26] 14 (11/16 1000) BP: (80-147)/(45-83) 134/71 mmHg (11/16 1000) SpO2:  [99 %-100 %] 99 % (11/16 1000) Weight:  [45.6 kg (100 lb 8.5 oz)] 45.6 kg (100 lb 8.5 oz) (11/16 0400) HEMODYNAMICS:   VENTILATOR SETTINGS:   INTAKE / OUTPUT:  Intake/Output Summary (Last 24 hours) at 10/20/14 1235 Last data filed at 10/20/14 1000  Gross per 24 hour  Intake  497.5 ml  Output    700 ml  Net -202.5 ml    PHYSICAL EXAMINATION: General:  Awake , cooperative Neuro: a/o x 3 , f/c , walked the icu fast HEENT:  NCAT, PERRL, ETT Cardiovascular:  RRR, no mgr Lungs:  CTA B Abdomen:  BS+, soft, nontender Musculoskeletal:  Normal bulk and tone, MAEW x 4  Skin:  No skin breakdown  LABS:  CBC  Recent Labs Lab 10/18/14 1627 10/19/14 0320 10/20/14 0229  WBC 9.0 15.1* 17.8*  HGB 12.0 11.9* 11.8*  HCT 36.5 36.7 35.6*  PLT 204 208 208   Coag's No results for input(s): APTT, INR in the last 168 hours. BMET  Recent Labs Lab 10/19/14 0320 10/19/14 1830 10/20/14 0229  NA 142 137 141  K 4.3 3.9 4.9  CL 110 105 110  CO2 19 15* 14*  BUN 18 16 19   CREATININE 1.13* 0.86 0.95  GLUCOSE 83 155*  98   Electrolytes  Recent Labs Lab 10/19/14 0320 10/19/14 1830 10/20/14 0229  CALCIUM 8.1* 8.5 9.2  MG 1.7 1.7 2.1  PHOS 2.7  --   --    Sepsis Markers  Recent Labs Lab 10/18/14 1637  LATICACIDVEN 1.87   ABG  Recent Labs Lab 10/18/14 1639  PHART 7.337*  PCO2ART 40.6  PO2ART 394.0*   Liver Enzymes  Recent Labs Lab 10/18/14 1627  AST 20  ALT 12  ALKPHOS 67  BILITOT 0.3  ALBUMIN 3.0*   Cardiac Enzymes No results for input(s): TROPONINI, PROBNP in the last 168 hours. Glucose  Recent Labs Lab 10/18/14 1559 10/18/14 2004 10/19/14 1351  GLUCAP 195* 101* 122*    Imaging Ct Head Wo Contrast  10/19/2014   CLINICAL DATA:  Acute respiratory failure with hypoxia J96.01 (ICD-10-CM) Acute encephalopathy G93.40 (ICD-10-CM)  EXAM: CT HEAD WITHOUT CONTRAST  TECHNIQUE: Contiguous axial images were obtained from the base of the skull through the vertex without intravenous contrast.  COMPARISON:  1 day prior  FINDINGS: Sinuses/Soft tissues: Fluid in the sphenoid sinus. Clear mastoid air cells.  Intracranial: No mass lesion, hemorrhage, hydrocephalus, acute infarct, intra-axial, or extra-axial fluid collection.  IMPRESSION: Normal head CT.   Electronically Signed   By: Jeronimo GreavesKyle  Talbot M.D.   On: 10/19/2014 16:20   Dg Chest Port 1v Same Day  10/19/2014   CLINICAL DATA:  Fever  EXAM: PORTABLE CHEST - 1 VIEW SAME DAY  COMPARISON:  10/18/2014  FINDINGS: Cardiomediastinal silhouette is stable. Mild hyperinflation. No acute infiltrate or pulmonary edema. Endotracheal and NG tube has been removed. No pneumothorax.  IMPRESSION: No active disease. Endotracheal and NG tube has been removed. No pneumothorax.   Electronically Signed   By: Natasha MeadLiviu  Pop M.D.   On: 10/19/2014 13:46    10/18/2014 chest x-ray reviewed lung fields are clear ET tube is in place 10/18/2014 EKG normal sinus rhythm, QTC is 496 no ischemic changes 10/19/14 EKG NSR , QTC 443 , no acute changes   ASSESSMENT /  PLAN:  NEUROLOGIC A:  Acute encephalopathy secondary to overdose of benzodiazepine as well as Ambien It should be noted that we do not know exactly what this patient took, however we are reassured that there is no toxic levels of acetaminophen or salicylates measured. Further, there is no other toxidrome suggested by her current clinical presentation. Presumably, this was a suicide attempt. Seizure Disorder -home Lamictal  CT head >no acute process,  11/15>improved, following commands, appropriate  P:   Will need Psych consult - will Plan to restart Lamictal , will call neuro to see  Ct reviewed, eeg done need result i called neuro seizure precautions add Suicide precautions  PULMONARY OETT November 14>  A:acute respiratory failure with hypoxemia secondary to inability to protect airway in the setting of acute encephalopathy P:   - Plan to extubate -O2 to keep sat >90% -ambulation successfull  CARDIOVASCULAR CVL not indicated A:  Mild sinus bradycardia likely related to overdose>resolved  QTC mildly prolonged at 496, again likely related to overdose as well as bradycardia>improving  P:  -telemetry monitoring with continuous QTC monitoring, keep tele  RENAL A:   Acute Renal Failure suspect d/t hypovolemia >improved, acidosis however lactic down P:   -Monitor BMET and UOP -Replace electrolytes as needed -repeat bmet in pm, lactic repeat with new event  GASTROINTESTINAL A:  Vomiting>resolved  P:   - Advance diet as tolerated   HEMATOLOGIC A: dvt , walking P:  -dc lov  INFECTIOUS A:  Fever/WBC bump  ?aspiration  Urinalysis normal P:   -off O2, good clinical status Follow wbc further with diff  ENDOCRINE A:  No acute issues   P:   -monitor glucose  FAMILY  - Update: son and mother updated 11/15 . 11/16  TODAY'S SUMMARY: psych called, neuro called, to tele, follow wbc, if spike add unasyn To [facility], precautions  Mcarthur Rossettianiel J. Tyson AliasFeinstein, MD, FACP Pgr:  [phone] [place] [facility]

## 2014-10-20 NOTE — Consult Note (Signed)
NEURO HOSPITALIST CONSULT NOTE    Reason for Consult: seizure  HPI:                                                                                                                                         Much of PMHx was obtained from chart   [name] is an 48 y.o. female with a past medical history significant for depression who has had multiple suicide attempts in the past who was admitted from the [facility] emergency department on 10/18/2014 after a presumed drug overdose.the patient's husband states that she does not abuse alcohol or illicit drugs and she only takes medications which are prescribed to her. She was last seen normal on Friday evening. Her husband saw her sleeping on 10/18/2014 at around 7 AM. However she was next found on 10/18/2014 at 3:30 PM in the bathroom by her son. She was breathing but was unresponsive. 911 was called and the fire department did not feel a pulse and so they initiated CPR for a very brief period of time. Shortly thereafter EMS arrived and found her to have a pulse. They administered glucose, calcium, and bicarbonate. She was transcutaneously paced briefly en route to the emergency department. In the emergency room she was found to have a normal blood pressure and a spontaneous rhythm which was narrow complex on telemetry. In the emergency department she had a CT head.  The family reports that she takes Ambien as well as Xanax at home. Her husband states that he typically tries to hide these pills from her but apparently she found them today. He estimates that she may have taken as many as 30 Ambien and 13 0.5 mg tablets of Xanax. On 11/15 CMA noted convulsive activity which was not noted by RN or MD. Patient exhibited a second event of seizure like activity later that afternoon. EEG showed multiple sharp waves but no epileptiform activity. Patient has had no further seizure like activity.  She is now back on home dose of Lamictal  and Zonigram.  She is also on Keppra 500 mg BID.   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   During consultation when patient was asked where she was, She would repeat "I though you were going to turn the TV on". She would perseverate on this sentence.  After leaving room, staff asked her how many children she has. Patient answered "two" and then would perseverate on this word. Later when Dr. Amada Jupiter entered room, she was bale to follow instructions and answer questions appropriately.   Past Medical History  Diagnosis Date  . Epilepsy     seizures, sees Dr. Anne Hahn  . Anxiety   . Depression   . Insomnia   . Headache(784.0)  migraine  . Hypertension   . Suicidal ideation   . Obsessive compulsive disorder   . Arthritis   . Gallstones   . Hemorrhoids   . Chronic constipation 01/10/2014    Past Surgical History  Procedure Laterality Date  . Abdominal hysterectomy  2001  . Cholecystectomy    . Breast lumpectomy Left 01/2007    Dr. Abbey Chattersosenbower  . Tonsillectomy    . Cesarean section    . Appendectomy Right     with ovarian cyst removal  . Hemorrhoid banding  2015    Family History  Problem Relation Age of Onset  . Cirrhosis Mother   . Heart disease Father   . Hypertension Father   . Thyroid cancer Brother   . HIV Brother   . Pancreatic cancer Maternal Uncle   . Stomach cancer Mother   . Heart disease Mother     Social History:  reports that she has never smoked. She has never used smokeless tobacco. She reports that she drinks alcohol. She reports that she does not use illicit drugs.  Allergies  Allergen Reactions  . Vimpat [Lacosamide]     Increased depression    MEDICATIONS:                                                                                                                     Scheduled: . antiseptic oral rinse  7 mL Mouth Rinse BID  . lamoTRIgine  50 mg Oral Daily  . levETIRAcetam  500 mg Intravenous Q12H  . zonisamide   300 mg Oral BID     ROS:                                                                                                                                       History obtained from family  General ROS: negative for - chills, fatigue, fever, night sweats, weight gain or weight loss Psychological ROS: negative for - behavioral disorder, hallucinations, memory difficulties, mood swings or suicidal ideation Ophthalmic ROS: negative for - blurry vision, double vision, eye pain or loss of vision ENT ROS: negative for - epistaxis, nasal discharge, oral lesions, sore throat, tinnitus or vertigo Allergy and Immunology ROS: negative for - hives or itchy/watery eyes Hematological and Lymphatic ROS: negative for - bleeding problems, bruising or swollen lymph nodes Endocrine ROS: negative for - galactorrhea, hair pattern changes, polydipsia/polyuria or temperature intolerance Respiratory ROS: negative  for - cough, hemoptysis, shortness of breath or wheezing Cardiovascular ROS: negative for - chest pain, dyspnea on exertion, edema or irregular heartbeat Gastrointestinal ROS: negative for - abdominal pain, diarrhea, hematemesis, nausea/vomiting or stool incontinence Genito-Urinary ROS: negative for - dysuria, hematuria, incontinence or urinary frequency/urgency Musculoskeletal ROS: negative for - joint swelling or muscular weakness Neurological ROS: as noted in HPI Dermatological ROS: negative for rash and skin lesion changes   Blood pressure 142/71, pulse 86, temperature 98 F (36.7 C), temperature source Oral, resp. rate 14, height 5\' 4"  (1.626 m), weight 45.6 kg (100 lb 8.5 oz), SpO2 98 %.   Neurologic Examination:                                                                                                       General: NAD Mental Status: Alert, when asked where she is, she would repeat " I thought you were going to turn on the TV"  Speech fluent without evidence of aphasia.  Able to follow  simple commands without difficulty. Cranial Nerves: II: Discs flat bilaterally; Visual fields grossly normal, pupils equal, round, reactive to light and accommodation III,IV, VI: ptosis not present, extra-ocular motions intact bilaterally V,VII: smile symmetric, facial light touch sensation normal bilaterally VIII: hearing normal bilaterally IX,X: gag reflex present XI: bilateral shoulder shrug XII: midline tongue extension without atrophy or fasciculations  Motor: Moving all extremities antigravity both purposefully and spontaneously.  She has noted asterixis of both UE and LE when limbs held outstretched.  Sensory: Pinprick and light touch intact throughout, bilaterally Deep Tendon Reflexes:  Right: Upper Extremity   Left: Upper extremity   biceps (C-5 to C-6) 2/4   biceps (C-5 to C-6) 2/4 tricep (C7) 2/4    triceps (C7) 2/4 Brachioradialis (C6) 2/4  Brachioradialis (C6) 2/4  Lower Extremity Lower Extremity  quadriceps (L-2 to L-4) 2/4   quadriceps (L-2 to L-4) 2/4 Achilles (S1) 2/4   Achilles (S1) 2/4  Plantars: Right: downgoing   Left: downgoing Cerebellar: patient would not follow commands.  Gait: Not tested for safety reasones CV: pulses palpable throughout   Lab Results: Basic Metabolic Panel:  Recent Labs Lab 10/18/14 1627 10/18/14 2038 10/19/14 0320 10/19/14 1830 10/20/14 0229  NA 139  --  142 137 141  K 3.5*  --  4.3 3.9 4.9  CL 107  --  110 105 110  CO2 20  --  19 15* 14*  GLUCOSE 189*  --  83 155* 98  BUN 25*  --  18 16 19   CREATININE 1.50*  --  1.13* 0.86 0.95  CALCIUM 8.3*  --  8.1* 8.5 9.2  MG  --  1.8 1.7 1.7 2.1  PHOS  --   --  2.7  --   --     Liver Function Tests:  Recent Labs Lab 10/18/14 1627  AST 20  ALT 12  ALKPHOS 67  BILITOT 0.3  PROT 6.3  ALBUMIN 3.0*   No results for input(s): LIPASE, AMYLASE in the last 168 hours. No results for input(s): AMMONIA in the  last 168 hours.  CBC:  Recent Labs Lab 10/18/14 1627  10/19/14 0320 10/20/14 0229  WBC 9.0 15.1* 17.8*  NEUTROABS 8.1*  --   --   HGB 12.0 11.9* 11.8*  HCT 36.5 36.7 35.6*  MCV 100.8* 101.7* 98.9  PLT 204 208 208    Cardiac Enzymes:  Recent Labs Lab 10/18/14 2038  CKTOTAL 274*    Lipid Panel: No results for input(s): CHOL, TRIG, HDL, CHOLHDL, VLDL, LDLCALC in the last 168 hours.  CBG:  Recent Labs Lab 10/18/14 1559 10/18/14 2004 10/19/14 1351  GLUCAP 195* 101* 122*    Microbiology: Results for orders placed or performed during the hospital encounter of 10/18/14  MRSA PCR Screening     Status: None   Collection Time: 10/18/14  8:05 PM  Result Value Ref Range Status   MRSA by PCR NEGATIVE NEGATIVE Final    Comment:        The GeneXpert MRSA Assay (FDA approved for NASAL specimens only), is one component of a comprehensive MRSA colonization surveillance program. It is not intended to diagnose MRSA infection nor to guide or monitor treatment for MRSA infections.     Coagulation Studies: No results for input(s): LABPROT, INR in the last 72 hours.  Imaging: Ct Head Wo Contrast  10/19/2014   CLINICAL DATA:  Acute respiratory failure with hypoxia J96.01 (ICD-10-CM) Acute encephalopathy G93.40 (ICD-10-CM)  EXAM: CT HEAD WITHOUT CONTRAST  TECHNIQUE: Contiguous axial images were obtained from the base of the skull through the vertex without intravenous contrast.  COMPARISON:  1 day prior  FINDINGS: Sinuses/Soft tissues: Fluid in the sphenoid sinus. Clear mastoid air cells.  Intracranial: No mass lesion, hemorrhage, hydrocephalus, acute infarct, intra-axial, or extra-axial fluid collection.  IMPRESSION: Normal head CT.   Electronically Signed   By: Jeronimo Greaves M.D.   On: 10/19/2014 16:20   Ct Head Wo Contrast  10/18/2014   CLINICAL DATA:  Post CPR, history of seizures, altered mental status  EXAM: CT HEAD WITHOUT CONTRAST  CT CERVICAL SPINE WITHOUT CONTRAST  TECHNIQUE: Multidetector CT imaging of the head and cervical  spine was performed following the standard protocol without intravenous contrast. Multiplanar CT image reconstructions of the cervical spine were also generated.  COMPARISON:  None.  FINDINGS: CT HEAD FINDINGS  No skull fracture is noted. Mastoid air cells are unremarkable. There is mucosal thickening with partial opacification bilateral ethmoid air cells. Mucosal thickening with air-fluid level noted bilateral sphenoid sinus. Abundant secretions are noted in nasopharynx.  No intracranial hemorrhage, mass effect or midline shift. The gray and white-matter differentiation is preserved. No acute cortical infarction. No mass lesion is noted on this unenhanced scan.  CT CERVICAL SPINE FINDINGS  Axial images shows no acute fracture or subluxation. Computer processed images shows cervical spine shows alignment, disc spaces and vertebral body heights to be preserved. No acute fracture or subluxation. Endotracheal and NG tube in place is noted.  There is no pneumothorax in visualized lung apices. Bilateral apical Mild pleural parenchymal scarring.  IMPRESSION: 1. No acute intracranial abnormality. 2. There is mucosal thickening with partial opacification bilateral ethmoid air cells. Mucosal thickening with air-fluid level bilateral sphenoid sinus. Abundant secretions are noted in nasopharynx. 3. No cervical spine acute fracture or subluxation. NG tube and endotracheal tube partially visualized.   Electronically Signed   By: Natasha Mead M.D.   On: 10/18/2014 17:17   Ct Cervical Spine Wo Contrast  10/18/2014   CLINICAL DATA:  Post CPR, history of seizures, altered  mental status  EXAM: CT HEAD WITHOUT CONTRAST  CT CERVICAL SPINE WITHOUT CONTRAST  TECHNIQUE: Multidetector CT imaging of the head and cervical spine was performed following the standard protocol without intravenous contrast. Multiplanar CT image reconstructions of the cervical spine were also generated.  COMPARISON:  None.  FINDINGS: CT HEAD FINDINGS  No skull  fracture is noted. Mastoid air cells are unremarkable. There is mucosal thickening with partial opacification bilateral ethmoid air cells. Mucosal thickening with air-fluid level noted bilateral sphenoid sinus. Abundant secretions are noted in nasopharynx.  No intracranial hemorrhage, mass effect or midline shift. The gray and white-matter differentiation is preserved. No acute cortical infarction. No mass lesion is noted on this unenhanced scan.  CT CERVICAL SPINE FINDINGS  Axial images shows no acute fracture or subluxation. Computer processed images shows cervical spine shows alignment, disc spaces and vertebral body heights to be preserved. No acute fracture or subluxation. Endotracheal and NG tube in place is noted.  There is no pneumothorax in visualized lung apices. Bilateral apical Mild pleural parenchymal scarring.  IMPRESSION: 1. No acute intracranial abnormality. 2. There is mucosal thickening with partial opacification bilateral ethmoid air cells. Mucosal thickening with air-fluid level bilateral sphenoid sinus. Abundant secretions are noted in nasopharynx. 3. No cervical spine acute fracture or subluxation. NG tube and endotracheal tube partially visualized.   Electronically Signed   By: Natasha MeadLiviu  Pop M.D.   On: 10/18/2014 17:17   Dg Chest Port 1 View  10/18/2014   CLINICAL DATA:  intubation, post cardiopulmonary resuscitation  EXAM: PORTABLE CHEST - 1 VIEW  COMPARISON:  01/04/2007  FINDINGS: Cardiomediastinal silhouette is stable. Mild hyperinflation. Endotracheal tube in place with tip 2 cm above the carina. NG tube in place with tip in mid stomach. No acute infiltrate or pulmonary edema. No pneumothorax.  IMPRESSION: No active disease.  Endotracheal tube in place.  No pneumothorax.   Electronically Signed   By: Natasha MeadLiviu  Pop M.D.   On: 10/18/2014 17:12   Dg Chest Port 1v Same Day  10/19/2014   CLINICAL DATA:  Fever  EXAM: PORTABLE CHEST - 1 VIEW SAME DAY  COMPARISON:  10/18/2014  FINDINGS:  Cardiomediastinal silhouette is stable. Mild hyperinflation. No acute infiltrate or pulmonary edema. Endotracheal and NG tube has been removed. No pneumothorax.  IMPRESSION: No active disease. Endotracheal and NG tube has been removed. No pneumothorax.   Electronically Signed   By: Natasha MeadLiviu  Pop M.D.   On: 10/19/2014 13:46       Assessment and plan per attending neurologist  Felicie Mornavid Smith PA-C [facility] [phone]  10/20/2014, 1:21 PM   Assessment/Plan: 48 yo F with recurrent seizures in the setting of recent medication overdose and history of seizure disorder. I do wonder about continued partial seizure activity and will get a repeat EEG today.   She has asterixis on exam suggestive of a toxic/metabolic contribution to her encephalopathy.   1) Restart home lyrica 75mg  BID 2) continue home lamictal 50mg  daily 3) continue home zonegran 600mg    4) continue keppra 500mg  BID 5) EEG today.   Ritta SlotMcNeill Aleathea Pugmire, MD [facility] [phone]  If 7pm- 7am, please page neurology on call as listed in AMION.

## 2014-10-20 NOTE — Progress Notes (Signed)
Patient ambulated around unit with walker. Very unstable gain. Alert this morning and following commands

## 2014-10-20 NOTE — Progress Notes (Signed)
INITIAL NUTRITION ASSESSMENT  DOCUMENTATION CODES Per approved criteria  -Underweight   INTERVENTION:  Ensure Complete PO TID, each supplement provides 350 kcal and 13 grams of protein  NUTRITION DIAGNOSIS: Underweight related to inadequate oral intake as evidenced by BMI=17.3.   Goal: Intake to meet >90% of estimated nutrition needs.  Monitor:  PO intake, labs, weight trend.  Reason for Assessment: RN request  48 y.o. female  Admitting Dx: Drug overdose  ASSESSMENT: 48 year old female with a past medical history significant for depression was [facility]18[facility] after a drug overdose requiring intubation and mechanical ventilation.  Patient was extubated on 11/15. She is underweight with BMI=17.3. 8% weight loss over the past 4 months. Patient reports poor appetite. Ate a few bites of breakfast and none of lunch. She agreed to try Ensure supplements between meals.   Nutrition Focused Physical Exam:  Subcutaneous Fat:  Orbital Region: WNL Upper Arm Region: WNL Thoracic and Lumbar Region: NA  Muscle:  Temple Region: mild depletion Clavicle Bone Region: moderate depletion Clavicle and Acromion Bone Region: moderate depletion Scapular Bone Region: NA Dorsal Hand: mild depletion Patellar Region: severe depletion Anterior Thigh Region: severe depletion Posterior Calf Region: severe depletion  Edema: none   Height: Ht Readings from Last 1 Encounters:  10/18/14 5\' 4"  (1.626 m)    Weight: Wt Readings from Last 1 Encounters:  10/20/14 100 lb 8.5 oz (45.6 kg)    Ideal Body Weight: 54.5 kg  % Ideal Body Weight: 84%  Wt Readings from Last 10 Encounters:  10/20/14 100 lb 8.5 oz (45.6 kg)  09/10/14 101 lb (45.813 kg)  09/01/14 102 lb (46.267 kg)  06/09/14 109 lb (49.442 kg)  03/27/14 106 lb (48.081 kg)  03/24/14 106 lb (48.081 kg)  01/29/14 107 lb 9.6 oz (48.807 kg)  01/10/14 107 lb 2 oz (48.592 kg)  09/23/13 104  lb (47.174 kg)  09/18/13 102 lb (46.267 kg)    Usual Body Weight: 106-109 lb  % Usual Body Weight: 92%  BMI:  Body mass index is 17.25 kg/(m^2). underweight  Estimated Nutritional Needs: Kcal: 1400-1600 Protein: 70-85 gm Fluid: >/= 1.5 L  Skin: WDL  Diet Order: Diet regular  EDUCATION NEEDS: -Education not appropriate at this time   Intake/Output Summary (Last 24 hours) at 10/20/14 1519 Last data filed at 10/20/14 1000  Gross per 24 hour  Intake  467.5 ml  Output    525 ml  Net  -57.5 ml    Last BM: PTA   Labs:   Recent Labs Lab 10/19/14 0320 10/19/14 1830 10/20/14 0229  NA 142 137 141  K 4.3 3.9 4.9  CL 110 105 110  CO2 19 15* 14*  BUN 18 16 19   CREATININE 1.13* 0.86 0.95  CALCIUM 8.1* 8.5 9.2  MG 1.7 1.7 2.1  PHOS 2.7  --   --   GLUCOSE 83 155* 98    CBG (last 3)   Recent Labs  10/18/14 1559 10/18/14 2004 10/19/14 1351  GLUCAP 195* 101* 122*    Scheduled Meds: . antiseptic oral rinse  7 mL Mouth Rinse BID  . lamoTRIgine  50 mg Oral Daily  . levETIRAcetam  500 mg Intravenous Q12H  . pregabalin  75 mg Oral BID  . zonisamide  300 mg Oral BID    Continuous Infusions:   Past Medical History  Diagnosis Date  . Epilepsy     seizures, sees Dr. Anne HahnWillis  . Anxiety   .  Depression   . Insomnia   . Headache(784.0)     migraine  . Hypertension   . Suicidal ideation   . Obsessive compulsive disorder   . Arthritis   . Gallstones   . Hemorrhoids   . Chronic constipation 01/10/2014    Past Surgical History  Procedure Laterality Date  . Abdominal hysterectomy  2001  . Cholecystectomy    . Breast lumpectomy Left 01/2007    Dr. Abbey Chattersosenbower  . Tonsillectomy    . Cesarean section    . Appendectomy Right     with ovarian cyst removal  . Hemorrhoid banding  2015    Joaquin CourtsKimberly Harris, RD, LDN, CNSC Pager [phone] After Hours Pager [phone]

## 2014-10-20 NOTE — Progress Notes (Signed)
Bedside EEG completed, results pending. 

## 2014-10-21 DIAGNOSIS — R569 Unspecified convulsions: Secondary | ICD-10-CM

## 2014-10-21 DIAGNOSIS — D72829 Elevated white blood cell count, unspecified: Secondary | ICD-10-CM | POA: Diagnosis present

## 2014-10-21 DIAGNOSIS — R001 Bradycardia, unspecified: Secondary | ICD-10-CM

## 2014-10-21 LAB — CBC WITH DIFFERENTIAL/PLATELET
BASOS PCT: 0 % (ref 0–1)
Basophils Absolute: 0 10*3/uL (ref 0.0–0.1)
EOS ABS: 0 10*3/uL (ref 0.0–0.7)
EOS PCT: 0 % (ref 0–5)
HCT: 34.1 % — ABNORMAL LOW (ref 36.0–46.0)
HEMOGLOBIN: 11.4 g/dL — AB (ref 12.0–15.0)
Lymphocytes Relative: 6 % — ABNORMAL LOW (ref 12–46)
Lymphs Abs: 0.9 10*3/uL (ref 0.7–4.0)
MCH: 32 pg (ref 26.0–34.0)
MCHC: 33.4 g/dL (ref 30.0–36.0)
MCV: 95.8 fL (ref 78.0–100.0)
MONOS PCT: 6 % (ref 3–12)
Monocytes Absolute: 0.9 10*3/uL (ref 0.1–1.0)
NEUTROS PCT: 88 % — AB (ref 43–77)
Neutro Abs: 13.1 10*3/uL — ABNORMAL HIGH (ref 1.7–7.7)
Platelets: 194 10*3/uL (ref 150–400)
RBC: 3.56 MIL/uL — ABNORMAL LOW (ref 3.87–5.11)
RDW: 12.8 % (ref 11.5–15.5)
WBC: 14.9 10*3/uL — ABNORMAL HIGH (ref 4.0–10.5)

## 2014-10-21 LAB — BASIC METABOLIC PANEL
Anion gap: 13 (ref 5–15)
BUN: 15 mg/dL (ref 6–23)
CALCIUM: 9 mg/dL (ref 8.4–10.5)
CO2: 16 mEq/L — ABNORMAL LOW (ref 19–32)
Chloride: 108 mEq/L (ref 96–112)
Creatinine, Ser: 0.9 mg/dL (ref 0.50–1.10)
GFR, EST AFRICAN AMERICAN: 87 mL/min — AB (ref >90)
GFR, EST NON AFRICAN AMERICAN: 75 mL/min — AB (ref >90)
Glucose, Bld: 113 mg/dL — ABNORMAL HIGH (ref 70–99)
Potassium: 3.7 mEq/L (ref 3.7–5.3)
Sodium: 137 mEq/L (ref 137–147)

## 2014-10-21 MED ORDER — LEVETIRACETAM IN NACL 750 MG/50ML IV SOLN
750.0000 mg | Freq: Two times a day (BID) | INTRAVENOUS | Status: DC
  Filled 2014-10-21: qty 50

## 2014-10-21 MED ORDER — LEVETIRACETAM 750 MG PO TABS
750.0000 mg | ORAL_TABLET | Freq: Two times a day (BID) | ORAL | Status: DC
  Administered 2014-10-21 – 2014-10-24 (×6): 750 mg via ORAL
  Filled 2014-10-21 (×7): qty 1

## 2014-10-21 MED ORDER — POTASSIUM CHLORIDE CRYS ER 20 MEQ PO TBCR
20.0000 meq | EXTENDED_RELEASE_TABLET | ORAL | Status: AC
  Administered 2014-10-21 (×2): 20 meq via ORAL
  Filled 2014-10-21 (×2): qty 1

## 2014-10-21 MED ORDER — ENOXAPARIN SODIUM 30 MG/0.3ML ~~LOC~~ SOLN
30.0000 mg | SUBCUTANEOUS | Status: DC
  Administered 2014-10-21 – 2014-10-22 (×2): 30 mg via SUBCUTANEOUS
  Filled 2014-10-21 (×5): qty 0.3

## 2014-10-21 MED ORDER — ZONISAMIDE 100 MG PO CAPS
300.0000 mg | ORAL_CAPSULE | Freq: Two times a day (BID) | ORAL | Status: DC
  Administered 2014-10-21 – 2014-10-24 (×6): 300 mg via ORAL
  Filled 2014-10-21 (×9): qty 3

## 2014-10-21 MED ORDER — LEVETIRACETAM IN NACL 750 MG/50ML IV SOLN
750.0000 mg | Freq: Two times a day (BID) | INTRAVENOUS | Status: DC
  Administered 2014-10-21: 750 mg via INTRAVENOUS
  Filled 2014-10-21 (×2): qty 50

## 2014-10-21 NOTE — Progress Notes (Signed)
Received patient from 14M, alert and oriented, not in any distress. Sitter at bedside. Will continue to monitor. Oriented to room and staff.

## 2014-10-21 NOTE — Progress Notes (Signed)
[facility]OC[facility] FOR AM LAB REPLACEMENT ONLY  The patient does apply for the [facility] Electrolyte Replacment Protocol based on the criteria listed below:   1. Is GFR >/= 40 ml/min? Yes.    Patient's GFR today is 74 2. Is urine output >/= 0.5 ml/kg/hr for the last 6 hours? Yes.   Patient's UOP is 0.7 ml/kg/hr 3. Is BUN < 60 mg/dL? Yes.    Patient's BUN today is 15 4. Abnormal electrolyte(s): K3.7 5. Ordered repletion with: [id number] 6. If a panic level lab has been reported, has the CCM MD in charge been notified? Yes.  .   Physician:  E Deterding,MD  [name], [name] 10/21/2014 5:26 AM

## 2014-10-21 NOTE — Progress Notes (Signed)
[place] TEAM 1 - Stepdown/ICU TEAM Progress Note  Philipp OvensMelinda S XXXLechner RUE:[id number] DOB: 12-13-1965 DOA: 10/18/2014 PCP: Nelwyn SalisburyFRY,STEPHEN A, MD  Admit HPI / Brief Narrative: 48 year old WF PMHx  Anxiety,Depression,seizure disorder, HTN,was [facility] 10/18/2014 [facility] after a drug overdose (suicide attempt) requiring intubation and mechanical ventilation.   HPI/Subjective: 11/17 states has attempted suicide on multiple occasions, currently does not have Homicidal/suicidal thoughts. States has not seen a psychiatrist in approximately 10 years. States the reason for discontinuing psychiatric evaluation was last psychiatrist recommended Electroconvulsive therapy (ECT),. States she never discussed this plan with her neurologist. States does follow-up with her neurologist Dr. Anne HahnWillis ([address]), last visit approximately 6 months ago.  Assessment/Plan: Suicide attempt/Acute encephalopathy(multiple attempts) -Secondary to overdose of benzodiazepine and Ambien -acute encephalopathy resolved -Awaiting psychiatric evaluation -Patient has been in numerous inpatient programs per the patient, recommend patient return to involuntary commitment.  acute respiratory failure with hypoxemia  -resolved  Mild sinus bradycardia likely related to overdose -resolved  -QTC mildly prolonged at 496, again likely related to overdose as well as bradycardia; will obtain repeat EKG in a.m.  Leukocytosis -Most likely Demargination secondary to stress from drug overdose. Obtain CBC in a.m.   Acute Renal Failure   Seizures -Continue Lamictal 50 mg daily; obtain Lamictal level -continue Keppra 750 mg BID obtain Keppra level -Continue seizure precaution     Code Status: FULL Family Communication: husband present at time of exam Disposition Plan: involuntary commitment; await psychiatric eval    Consultants: Dr. Ritta SlotMcNeill Kirkpatrick (neurology) Dr. Rory Percyaniel Feinstein  [facility](P[facility] M) Psychiatric eval pending   Procedure/Significant Events: 11/15 CT head without contrast; no acute findings 11/16 EEG; consistent with an area of potential epileptogenicity in the right parieto-occipital region.  There was no seizure recorded on this study.   Culture 10/14 MRSA by PCR negative   Antibiotics: NA  DVT prophylaxis: Lovenox   Devices N/A  LINES / TUBES:      Continuous Infusions:   Objective: VITAL SIGNS: Temp: 98.4 F (36.9 C) (11/17 1158) Temp Source: Oral (11/17 1158) BP: 137/79 mmHg (11/17 1300) Pulse Rate: 94 (11/17 1300) SPO2; FIO2:   Intake/Output Summary (Last 24 hours) at 10/21/14 1307 Last data filed at 10/21/14 1147  Gross per 24 hour  Intake   1210 ml  Output    700 ml  Net    510 ml     Exam: General: A/O 4, NAD,No acute respiratory distress Lungs: Clear to auscultation bilaterally without wheezes or crackles Cardiovascular: Regular rate and rhythm without murmur gallop or rub normal S1 and S2 Abdomen: Nontender, nondistended, soft, bowel sounds positive, no rebound, no ascites, no appreciable mass Extremities: No significant cyanosis, clubbing, or edema bilateral lower extremities  Data Reviewed: Basic Metabolic Panel:  Recent Labs Lab 10/18/14 2038 10/19/14 0320 10/19/14 1830 10/20/14 0229 10/20/14 1818 10/21/14 0235  NA  --  142 137 141 139 137  K  --  4.3 3.9 4.9 3.4* 3.7  CL  --  110 105 110 107 108  CO2  --  19 15* 14* 18* 16*  GLUCOSE  --  83 155* 98 168* 113*  BUN  --  18 16 19 15 15   CREATININE  --  1.13* 0.86 0.95 0.94 0.90  CALCIUM  --  8.1* 8.5 9.2 9.3 9.0  MG 1.8 1.7 1.7 2.1  --   --   PHOS  --  2.7  --   --   --   --  Liver Function Tests:  Recent Labs Lab 10/18/14 1627  AST 20  ALT 12  ALKPHOS 67  BILITOT 0.3  PROT 6.3  ALBUMIN 3.0*   No results for input(s): LIPASE, AMYLASE in the last 168 hours.  Recent Labs Lab 10/20/14 1818  AMMONIA 28   CBC:  Recent Labs Lab  10/18/14 1627 10/19/14 0320 10/20/14 0229 10/21/14 0235  WBC 9.0 15.1* 17.8* 14.9*  NEUTROABS 8.1*  --   --  13.1*  HGB 12.0 11.9* 11.8* 11.4*  HCT 36.5 36.7 35.6* 34.1*  MCV 100.8* 101.7* 98.9 95.8  PLT 204 208 208 194   Cardiac Enzymes:  Recent Labs Lab 10/18/14 2038  CKTOTAL 274*   BNP (last 3 results) No results for input(s): PROBNP in the last 8760 hours. CBG:  Recent Labs Lab 10/18/14 1559 10/18/14 2004 10/19/14 1351  GLUCAP 195* 101* 122*    Recent Results (from the past 240 hour(s))  MRSA PCR Screening     Status: None   Collection Time: 10/18/14  8:05 PM  Result Value Ref Range Status   MRSA by PCR NEGATIVE NEGATIVE Final    Comment:        The GeneXpert MRSA Assay (FDA approved for NASAL specimens only), is one component of a comprehensive MRSA colonization surveillance program. It is not intended to diagnose MRSA infection nor to guide or monitor treatment for MRSA infections.      Studies:  Recent x-ray studies have been reviewed in detail by the Attending Physician  Scheduled Meds:  Scheduled Meds: . feeding supplement (ENSURE COMPLETE)  237 mL Oral TID BM  . lamoTRIgine  50 mg Oral Daily  . levETIRAcetam  750 mg Oral Q12H  . pregabalin  75 mg Oral BID  . zonisamide  300 mg Oral BID    Time spent on care of this patient: 40 mins   Drema DallasWOODS, CURTIS, J , MD   [facility] Office  [phone] Pager [phone]  On-Call/Text Page:      [email or web address]      password [email or web address]  If 7PM-7AM, please contact night-coverage [email or web address] Password [email or web address] 10/21/2014, 1:07 PM   LOS: 3 days

## 2014-10-21 NOTE — Progress Notes (Signed)
Pharmacy Consult : Lovenox for DVT prophylaxis.   Wt < 45 kg, CrCl > 30 mL/min and stable. H/H stable. Plts 194- stable for admission.   Plan: Lovenox 30mg  SQ q24h due to low body weight.  Pharmacy will sign off.   Link SnufferJessica Ladean Steinmeyer, PharmD, BCPS Clinical Pharmacist [phone] 10/21/2014, 5:54 PM

## 2014-10-21 NOTE — Progress Notes (Signed)
Subjective: One episode of repetitive speech overnight.   She endorses overdosing on zonegran in addition to other medications.   Exam: Filed Vitals:   10/21/14 0900  BP: 144/83  Pulse: 97  Temp:   Resp: 23   Gen: In bed, NAD MS: Awake, alert, oriented  ZO:XWRUECN:PERRL, EOMI Motor: 5/5 throughout Sensory:intact to LT Continues to have asterixis.   EEG - no NCSE, appears encephalopathic.   Impression: 48 yo F admitted with overdose with a history of seizure disorder who has had several episodes of repetitive speech. I suspect that her asterixis is medication related. Of note zonegran has between a 30 and 60 hour half life making it the likely culprit for continued asterixis and EEG changes. Levels for this would be a send out lab and I am not sure that they would be helpful for deciding how long to hold this medication. I will hold the next dose, but then restart it as I am concerned she may be having continued partial seizures. This is not definite and I would favor continuing her home regimen with the addition of keppra for now, likely can revert to home regimen once stable.   Recommendations: 1) continue home lyrica 75mg  BID 2) continue home lamictal 50mg  daily 3) continue home zonegran 600mg  per day div BID.  4) increase keppra to 750mg  BID   Ritta SlotMcNeill Jaquis Picklesimer, MD [facility] [phone]  If 7pm- 7am, please page neurology on call as listed in AMION.

## 2014-10-21 NOTE — Plan of Care (Signed)
Problem: Phase I Progression Outcomes Goal: Oral Care per Protocol Outcome: Completed/Met Date Met:  10/21/14 Goal: HOB elevated 30 degrees Outcome: Completed/Met Date Met:  10/21/14 Goal: VAP prevention protocol initiated Outcome: Completed/Met Date Met:  10/21/14 Goal: Hemodynamically stable Outcome: Completed/Met Date Met:  10/21/14  Problem: Phase II Progression Outcomes Goal: Date pt extubated/weaned off vent Outcome: Not Applicable Date Met:  32/54/98 Goal: Time pt extubated/weaned off vent Outcome: Not Applicable Date Met:  26/41/58 Goal: Hemodynamically stable Outcome: Completed/Met Date Met:  10/21/14 Goal: Tolerating prescribed nutrition plan Outcome: Completed/Met Date Met:  10/21/14  Problem: Phase III Progression Outcomes Goal: Hemodynamically stable Outcome: Completed/Met Date Met:  10/21/14 Goal: Activity advanced as tolerated Outcome: Completed/Met Date Met:  10/21/14

## 2014-10-21 NOTE — Progress Notes (Signed)
Key Points: Use following P&T approved IV to PO antibiotic change policy.  Description contains the criteria that are approved Note: Policy Excludes:  Esophagectomy patientsPHARMACIST - PHYSICIAN COMMUNICATION DR:   Ritta SlotMcNeill Kirkpatrick CONCERNING: IV to Oral Route Change Policy  RECOMMENDATION: This patient is receiving Levetiracetam by the intravenous route.  Based on criteria approved by the Pharmacy and Therapeutics Committee, the intravenous medication(s) is/are being converted to the equivalent oral dose form(s).   DESCRIPTION: These criteria include:  The patient is eating (either orally or via tube) and/or has been taking other orally administered medications for a least 24 hours  The patient has no evidence of active gastrointestinal bleeding or impaired GI absorption (gastrectomy, short bowel, patient on TNA or NPO).  Thanks,  Russ HaloAshley Aubriee Szeto, PharmD Clinical Pharmacist - Resident Pager: [phone] 11/17/201511:48 AM

## 2014-10-21 NOTE — Evaluation (Signed)
Physical Therapy Evaluation and Discharge Patient Details Name: Shannon OvensMelinda [name] [name] MRN: [id number] DOB: 1966/06/11 Today'[name] Date: [name]   History of Present Illness  48 yo F admitted following and overdose, with hx of seizure disorder, has had recurrent episodes of altered mental status since admission.  Clinical Impression  Patient evaluated by Physical Therapy with no further acute PT needs identified. All education has been completed and the patient has no further questions. She was able to safely perform challenging dynamic gait tasks without need for physical assist to correct balance. Minor balance difficulties noted with challenging static activities however, pt reports this is her baseline prior to admission. Of note, HR elevated to 140 bpm during ambulatory bout but quickly decreased upon sitting to 114 bpm upon resting. See below for any follow-up Physial Therapy or equipment needs. PT is signing off. Thank you for this referral.     Follow Up Recommendations No PT follow up    Equipment Recommendations  None recommended by PT    Recommendations for Other Services       Precautions / Restrictions Precautions Precautions: None Restrictions Weight Bearing Restrictions: No      Mobility  Bed Mobility Overal bed mobility: Modified Independent                Transfers Overall transfer level: Modified independent                  Ambulation/Gait Ambulation/Gait assistance: Supervision Ambulation Distance (Feet): 450 Feet Assistive device: Rolling walker (2 wheeled);None Gait Pattern/deviations: Step-through pattern;Drifts right/left     General Gait Details: Began ambulation with a rolling walker. Demonstrates good stability but bumped in to several objects while using this device. Practiced gait training without an assistive device and was able to safely perform challenging dynamic gait activites including head turns in horizontal and vertical  directions, high marching, variable speeds, and backwards stepping; all performed without loss of balance or need for physical assist, but with minor sway noted. HR elevated to 140 during final lap around unit, but quickly dropped to 114 bpm after sitting.  Stairs            Wheelchair Mobility    Modified Rankin (Stroke Patients Only)       Balance Overall balance assessment: Needs assistance Sitting-balance support: Feet supported;No upper extremity supported Sitting balance-Leahy Scale: Normal     Standing balance support: No upper extremity supported Standing balance-Leahy Scale: Good   Single Leg Stance - Right Leg:  (able to hold 15 seconds) Single Leg Stance - Left Leg:  (able to hold 5 seconds) Tandem Stance - Right Leg:  (able to stand 5 seconds) Tandem Stance - Left Leg:  (able to stand 10 seconds) Rhomberg - Eyes Opened:  (30 sec no loss of balance) Rhomberg - Eyes Closed:  (30 sec. no loss of balance)                 Pertinent Vitals/Pain Pain Assessment: 0-10 Pain Score: 8  Pain Descriptors / Indicators: Constant Pain Intervention([name]): Monitored during session;Repositioned;Patient requesting pain meds-RN notified   Start:  BP 138/83 ---- HR 102 ---- SpO2 99% on room air  Ambulating: HR 140 bpm  End: BP 132/82 ---- HR 112 ---- SpO2 100% on room air      Home Living Family/patient expects to be discharged to:: Private residence Living Arrangements: Spouse/significant other Available Help at Discharge: Family (husband works 11p-7a (pt also works 11-7)) Type of Home: Mobile home Home Access:  Stairs to enter Entrance Stairs-Rails: None Entrance Stairs-Number of Steps: 2   Home Equipment: None      Prior Function Level of Independence: Independent               Hand Dominance   Dominant Hand: Right    Extremity/Trunk Assessment   Upper Extremity Assessment: Defer to OT evaluation           Lower Extremity Assessment:  Overall WFL for tasks assessed         Communication   Communication: No difficulties  Cognition Arousal/Alertness: Awake/alert Behavior During Therapy: WFL for tasks assessed/performed Overall Cognitive Status: Within Functional Limits for tasks assessed                      General Comments General comments (skin integrity, edema, etc.): Husband present and answered questions during therapy evaluation    Exercises        Assessment/Plan    PT Assessment Patent does not need any further PT services  PT Diagnosis Abnormality of gait   PT Problem List    PT Treatment Interventions     PT Goals (Current goals can be found in the Care Plan section) Acute Rehab PT Goals Patient Stated Goal: Go home PT Goal Formulation: All assessment and education complete, DC therapy    Frequency     Barriers to discharge        Co-evaluation               End of Session   Activity Tolerance: Patient tolerated treatment well Patient left: in bed;with call bell/phone within reach;with nursing/sitter in room;with family/visitor present Nurse Communication: Mobility status;Other (comment) (HR to 140 bpm)         Time: [id number] PT Time Calculation (min) (ACUTE ONLY): 21 min   Charges:   PT Evaluation $Initial PT Evaluation Tier I: 1 Procedure PT Treatments $Gait Training: 8-22 mins   PT G CodesBerton Mount:          [name], [name]  Charlsie MerlesLogan Secor Aarin Bluett, [place]PT [phone]

## 2014-10-22 DIAGNOSIS — F411 Generalized anxiety disorder: Secondary | ICD-10-CM

## 2014-10-22 DIAGNOSIS — F332 Major depressive disorder, recurrent severe without psychotic features: Secondary | ICD-10-CM

## 2014-10-22 LAB — CBC WITH DIFFERENTIAL/PLATELET
Basophils Absolute: 0 10*3/uL (ref 0.0–0.1)
Basophils Relative: 0 % (ref 0–1)
EOS ABS: 0.2 10*3/uL (ref 0.0–0.7)
Eosinophils Relative: 2 % (ref 0–5)
HCT: 35 % — ABNORMAL LOW (ref 36.0–46.0)
Hemoglobin: 11.4 g/dL — ABNORMAL LOW (ref 12.0–15.0)
LYMPHS PCT: 11 % — AB (ref 12–46)
Lymphs Abs: 1.1 10*3/uL (ref 0.7–4.0)
MCH: 31.5 pg (ref 26.0–34.0)
MCHC: 32.6 g/dL (ref 30.0–36.0)
MCV: 96.7 fL (ref 78.0–100.0)
Monocytes Absolute: 0.7 10*3/uL (ref 0.1–1.0)
Monocytes Relative: 7 % (ref 3–12)
NEUTROS PCT: 80 % — AB (ref 43–77)
Neutro Abs: 7.7 10*3/uL (ref 1.7–7.7)
PLATELETS: 232 10*3/uL (ref 150–400)
RBC: 3.62 MIL/uL — AB (ref 3.87–5.11)
RDW: 13 % (ref 11.5–15.5)
WBC: 9.7 10*3/uL (ref 4.0–10.5)

## 2014-10-22 LAB — BASIC METABOLIC PANEL
ANION GAP: 10 (ref 5–15)
BUN: 13 mg/dL (ref 6–23)
CO2: 18 mEq/L — ABNORMAL LOW (ref 19–32)
Calcium: 8.9 mg/dL (ref 8.4–10.5)
Chloride: 112 mEq/L (ref 96–112)
Creatinine, Ser: 0.89 mg/dL (ref 0.50–1.10)
GFR, EST AFRICAN AMERICAN: 88 mL/min — AB (ref >90)
GFR, EST NON AFRICAN AMERICAN: 76 mL/min — AB (ref >90)
Glucose, Bld: 92 mg/dL (ref 70–99)
POTASSIUM: 4 meq/L (ref 3.7–5.3)
SODIUM: 140 meq/L (ref 137–147)

## 2014-10-22 MED ORDER — BOOST PLUS PO LIQD
237.0000 mL | Freq: Three times a day (TID) | ORAL | Status: DC
  Administered 2014-10-22 – 2014-10-24 (×2): 237 mL via ORAL
  Filled 2014-10-22 (×11): qty 237

## 2014-10-22 NOTE — Clinical Documentation Improvement (Signed)
Please specify diagnosis related to below supporting information if appropriate.   Possible Clinical conditions  Underweight w/BMI 17.25  Other condition___________________  Cannot clinically determine _____________   Supporting Information:  Per 10/20/14 RD progress note & initial assessment =  BMI: Body mass index is 17.25 kg/(m^2). Underweight.      Thank You,  Shelda Palarlene H Latanja Lehenbauer ,RN Clinical Documentation Specialist:  [phone]  [facility]- Health Information Management

## 2014-10-22 NOTE — Plan of Care (Signed)
Problem: Phase I Progression Outcomes Goal: Pain controlled with appropriate interventions Outcome: Completed/Met Date Met:  10/22/14 Goal: OOB as tolerated unless otherwise ordered Outcome: Completed/Met Date Met:  10/22/14 Goal: Voiding-avoid urinary catheter unless indicated Outcome: Completed/Met Date Met:  10/22/14 Goal: Hemodynamically stable Outcome: Completed/Met Date Met:  10/22/14

## 2014-10-22 NOTE — Progress Notes (Signed)
Subjective: No further seizures, now back at [place] mental status.   Exam: Filed Vitals:   10/22/14 1318  BP: 145/90  Pulse: 90  Temp: 98.2 F (36.8 C)  Resp: 18   Gen: In bed, NAD MS: Awake, alert, oriented  ON:GEXBMCN:PERRL, EOMI Motor: 5/5 throughout Sensory:intact to LT Asterixis is much improved, barely visible  Impression: 48 yo F admitted with overdose with a history of seizure disorder who has had several episodes of repetitive speech. I suspect that her asterixis was medication related. Given that she was so well controlled prior to this event, I would not favor long term Keppra therapy since she was not on this before, but would continue it in the short-term.  Recommendations: 1) continue home lyrica 75mg  BID 2) continue home lamictal 50mg  daily 3) continue home zonegran 600mg  per day div BID.  4) keppra 750mg  BID x 2 weeks, then discontinue 5) neurology will sign off at this time, please call if any further questions or concerns remain.  Ritta SlotMcNeill Kirkpatrick, MD [facility] [phone]  If 7pm- 7am, please page neurology on call as listed in AMION.

## 2014-10-22 NOTE — Consult Note (Signed)
[facility] Face-to-Face Psychiatry Consult   Reason for Consult:  Depression and suicidal attempt Referring Physician:  Dr. Deberah Castle is an 48 y.o. female. Total Time spent with patient: 45 minutes  Assessment: AXIS I:  Generalized Anxiety Disorder and Major Depression, Recurrent severe AXIS II:  Deferred AXIS III:   Past Medical History  Diagnosis Date  . Epilepsy     seizures, sees Dr. Jannifer Franklin  . Anxiety   . Depression   . Insomnia   . Headache(784.0)     migraine  . Hypertension   . Suicidal ideation   . Obsessive compulsive disorder   . Arthritis   . Gallstones   . Hemorrhoids   . Chronic constipation 01/10/2014   AXIS IV:  other psychosocial or environmental problems, problems related to social environment and problems with primary support group AXIS V:  41-50 serious symptoms  Plan: Recommend psychiatric Inpatient admission when medically cleared. Supportive therapy provided about ongoing stressors.  May needs IVC if tries to [facility] Refer to psych social service for placement needs Appreciate psychiatric consultation and follow up as clinically required Please contact [phone] or [phone] if needs further assistance  Subjective:   [name] is a 48 y.o. female patient admitted with Depression and suicidal attempt.  HPI:  This is a 48 year old female seen for psych consultation and evaluation of depression and suicidal ideations. Patient stated that she has no current out patient psychiatric services and has been suffering with significant stresses at work, quit two weeks ago, felt overwhelmed and overdose on medication with intention to end her life. She has stated that going to [place] made it feel more heat from the colleagues which she can not take it. She stated that since than she is spiraling down emotionally, depressed, sad, anxious, disturbed sleep and appetite and becomes suicidal. She has chronic and recurrent depression and  multiple previous suicide attempt required psych admission. She has no history of abuse or victimization. Patient mother who is at bed side stated they do not believe in acute psych admission due to previously failed to respond. Patient states that she wish to go home without treatment. Reportedly she failed all antidepressant medications and her last psychiatric visit told her she may need to be referred to ECT treatment.   Medical History: Patient with a past medical history significant for depression who has had multiple suicide attempts in the past who was admitted from the [facility] emergency department on 10/18/2014 after a presumed drug overdose.the patient's husband states that she does not abuse alcohol or illicit drugs and she only takes medications which are prescribed to her. She has "chronic depression" but of some of late she has not been feeling worse than normal. She was last seen normal on Friday evening. Her husband saw her sleeping on 10/18/2014 at around 7 AM. However she was next found on 10/18/2014 at 3:30 PM in the bathroom by her son. She was breathing but was unresponsive. 911 was called and the fire department did not feel a pulse and so they initiated CPR for a very brief period of time. Shortly thereafter EMS arrived and found her to have a pulse. They administered glucose, calcium, and bicarbonate. She was transcutaneously paced briefly en route to the emergency department. In the emergency room she was found to have a normal blood pressure and a spontaneous rhythm which was narrow complex on telemetry. Transcutaneous pacing was held. She had been intubated in  route to the emergency department by EMS after a vomiting episode. In the emergency department she had a CT head. The family reports that she takes Ambien as well as Xanax at home. Her husband states that he typically tries to hide these pills from her but apparently she found them today. He estimates that she may have taken as  many as 30 Ambien and 13 0.5 mg tablets of Xanax.  PAST MEDICAL HISTORY :   has a past medical history of Epilepsy; Anxiety; Depression; Insomnia; Headache(784.0); Hypertension; Suicidal ideation; Obsessive compulsive disorder; Arthritis; Gallstones; Hemorrhoids; and Chronic constipation (01/10/2014). She has past surgical history that includes Abdominal hysterectomy (2001); Cholecystectomy; Breast lumpectomy (Left, 01/2007); Tonsillectomy; Cesarean section; Appendectomy (Right); and Hemorrhoid banding (2015).  HPI Elements:   Location:  depression. Quality:  poor'. Severity:  status post suicidal attempt. 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  Past Psychiatric History: Past Medical History  Diagnosis Date  . Epilepsy     seizures, sees Dr. Jannifer Franklin  . Anxiety   . Depression   . Insomnia   . Headache(784.0)     migraine  . Hypertension   . Suicidal ideation   . Obsessive compulsive disorder   . Arthritis   . Gallstones   . Hemorrhoids   . Chronic constipation 01/10/2014    reports that she has never smoked. She has never used smokeless tobacco. She reports that she drinks alcohol. She reports that she does not use illicit drugs. Family History  Problem Relation Age of Onset  . Cirrhosis Mother   . Heart disease Father   . Hypertension Father   . Thyroid cancer Brother   . HIV Brother   . Pancreatic cancer Maternal Uncle   . Stomach cancer Mother   . Heart disease Mother      Living Arrangements: Spouse/significant other   Abuse/Neglect [facility]) Physical Abuse: Denies Verbal Abuse: Denies Sexual Abuse: Denies Allergies:   Allergies  Allergen Reactions  . Vimpat [Lacosamide]     Increased depression    ACT Assessment Complete:  No Objective: Blood pressure 131/87, pulse 84, temperature 98.2 F (36.8 C), temperature source Oral, resp. rate 18, height '5\' 4"'  (1.626 m), weight 45.405 kg (100 lb 1.6 oz), SpO2 99 %.Body mass index is 17.17 kg/(m^2). Results for orders  placed or performed during the hospital encounter of 10/18/14 (from the past 72 hour(s))  Glucose, capillary     Status: Abnormal   Collection Time: 10/19/14  1:51 PM  Result Value Ref Range   Glucose-Capillary 122 (H) 70 - 99 mg/dL  Basic metabolic panel     Status: Abnormal   Collection Time: 10/19/14  6:30 PM  Result Value Ref Range   Sodium 137 137 - 147 mEq/L   Potassium 3.9 3.7 - 5.3 mEq/L   Chloride 105 96 - 112 mEq/L   CO2 15 (L) 19 - 32 mEq/L   Glucose, Bld 155 (H) 70 - 99 mg/dL   BUN 16 6 - 23 mg/dL   Creatinine, Ser 0.86 0.50 - 1.10 mg/dL   Calcium 8.5 8.4 - 10.5 mg/dL   GFR calc non Af Amer 79 (L) >90 mL/min   GFR calc Af Amer >90 >90 mL/min    Comment: (NOTE) The eGFR has been calculated using the CKD EPI equation. This calculation has not been validated in all clinical situations. eGFR's persistently <90 mL/min signify possible Chronic Kidney Disease.    Anion gap 17 (H) 5 - 15  Magnesium  Status: None   Collection Time: 10/19/14  6:30 PM  Result Value Ref Range   Magnesium 1.7 1.5 - 2.5 mg/dL  CBC     Status: Abnormal   Collection Time: 10/20/14  2:29 AM  Result Value Ref Range   WBC 17.8 (H) 4.0 - 10.5 K/uL   RBC 3.60 (L) 3.87 - 5.11 MIL/uL   Hemoglobin 11.8 (L) 12.0 - 15.0 g/dL   HCT 35.6 (L) 36.0 - 46.0 %   MCV 98.9 78.0 - 100.0 fL   MCH 32.8 26.0 - 34.0 pg   MCHC 33.1 30.0 - 36.0 g/dL   RDW 13.1 11.5 - 15.5 %   Platelets 208 150 - 400 K/uL  Basic metabolic panel     Status: Abnormal   Collection Time: 10/20/14  2:29 AM  Result Value Ref Range   Sodium 141 137 - 147 mEq/L   Potassium 4.9 3.7 - 5.3 mEq/L    Comment: DELTA CHECK NOTED   Chloride 110 96 - 112 mEq/L   CO2 14 (L) 19 - 32 mEq/L   Glucose, Bld 98 70 - 99 mg/dL   BUN 19 6 - 23 mg/dL   Creatinine, Ser 0.95 0.50 - 1.10 mg/dL   Calcium 9.2 8.4 - 10.5 mg/dL   GFR calc non Af Amer 70 (L) >90 mL/min   GFR calc Af Amer 81 (L) >90 mL/min    Comment: (NOTE) The eGFR has been calculated  using the CKD EPI equation. This calculation has not been validated in all clinical situations. eGFR's persistently <90 mL/min signify possible Chronic Kidney Disease.    Anion gap 17 (H) 5 - 15  Magnesium     Status: None   Collection Time: 10/20/14  2:29 AM  Result Value Ref Range   Magnesium 2.1 1.5 - 2.5 mg/dL  Lactic acid, plasma     Status: None   Collection Time: 10/20/14  1:30 PM  Result Value Ref Range   Lactic Acid, Venous 1.1 0.5 - 2.2 mmol/L  Basic metabolic panel     Status: Abnormal   Collection Time: 10/20/14  6:18 PM  Result Value Ref Range   Sodium 139 137 - 147 mEq/L   Potassium 3.4 (L) 3.7 - 5.3 mEq/L   Chloride 107 96 - 112 mEq/L   CO2 18 (L) 19 - 32 mEq/L   Glucose, Bld 168 (H) 70 - 99 mg/dL   BUN 15 6 - 23 mg/dL   Creatinine, Ser 0.94 0.50 - 1.10 mg/dL   Calcium 9.3 8.4 - 10.5 mg/dL   GFR calc non Af Amer 71 (L) >90 mL/min   GFR calc Af Amer 82 (L) >90 mL/min    Comment: (NOTE) The eGFR has been calculated using the CKD EPI equation. This calculation has not been validated in all clinical situations. eGFR's persistently <90 mL/min signify possible Chronic Kidney Disease.    Anion gap 14 5 - 15  Ammonia     Status: None   Collection Time: 10/20/14  6:18 PM  Result Value Ref Range   Ammonia 28 11 - 60 umol/L  TSH     Status: Abnormal   Collection Time: 10/20/14  6:18 PM  Result Value Ref Range   TSH 0.243 (L) 0.350 - 4.500 uIU/mL  CBC with Differential     Status: Abnormal   Collection Time: 10/21/14  2:35 AM  Result Value Ref Range   WBC 14.9 (H) 4.0 - 10.5 K/uL   RBC 3.56 (L) 3.87 - 5.11  MIL/uL   Hemoglobin 11.4 (L) 12.0 - 15.0 g/dL   HCT 34.1 (L) 36.0 - 46.0 %   MCV 95.8 78.0 - 100.0 fL   MCH 32.0 26.0 - 34.0 pg   MCHC 33.4 30.0 - 36.0 g/dL   RDW 12.8 11.5 - 15.5 %   Platelets 194 150 - 400 K/uL   Neutrophils Relative % 88 (H) 43 - 77 %   Neutro Abs 13.1 (H) 1.7 - 7.7 K/uL   Lymphocytes Relative 6 (L) 12 - 46 %   Lymphs Abs 0.9 0.7 - 4.0  K/uL   Monocytes Relative 6 3 - 12 %   Monocytes Absolute 0.9 0.1 - 1.0 K/uL   Eosinophils Relative 0 0 - 5 %   Eosinophils Absolute 0.0 0.0 - 0.7 K/uL   Basophils Relative 0 0 - 1 %   Basophils Absolute 0.0 0.0 - 0.1 K/uL  Basic metabolic panel     Status: Abnormal   Collection Time: 10/21/14  2:35 AM  Result Value Ref Range   Sodium 137 137 - 147 mEq/L   Potassium 3.7 3.7 - 5.3 mEq/L   Chloride 108 96 - 112 mEq/L   CO2 16 (L) 19 - 32 mEq/L   Glucose, Bld 113 (H) 70 - 99 mg/dL   BUN 15 6 - 23 mg/dL   Creatinine, Ser 0.90 0.50 - 1.10 mg/dL   Calcium 9.0 8.4 - 10.5 mg/dL   GFR calc non Af Amer 75 (L) >90 mL/min   GFR calc Af Amer 87 (L) >90 mL/min    Comment: (NOTE) The eGFR has been calculated using the CKD EPI equation. This calculation has not been validated in all clinical situations. eGFR's persistently <90 mL/min signify possible Chronic Kidney Disease.    Anion gap 13 5 - 15  BMET in AM     Status: Abnormal   Collection Time: 10/22/14  5:30 AM  Result Value Ref Range   Sodium 140 137 - 147 mEq/L   Potassium 4.0 3.7 - 5.3 mEq/L   Chloride 112 96 - 112 mEq/L   CO2 18 (L) 19 - 32 mEq/L   Glucose, Bld 92 70 - 99 mg/dL   BUN 13 6 - 23 mg/dL   Creatinine, Ser 0.89 0.50 - 1.10 mg/dL   Calcium 8.9 8.4 - 10.5 mg/dL   GFR calc non Af Amer 76 (L) >90 mL/min   GFR calc Af Amer 88 (L) >90 mL/min    Comment: (NOTE) The eGFR has been calculated using the CKD EPI equation. This calculation has not been validated in all clinical situations. eGFR's persistently <90 mL/min signify possible Chronic Kidney Disease.    Anion gap 10 5 - 15  CBC with Differential     Status: Abnormal   Collection Time: 10/22/14  5:30 AM  Result Value Ref Range   WBC 9.7 4.0 - 10.5 K/uL   RBC 3.62 (L) 3.87 - 5.11 MIL/uL   Hemoglobin 11.4 (L) 12.0 - 15.0 g/dL   HCT 35.0 (L) 36.0 - 46.0 %   MCV 96.7 78.0 - 100.0 fL   MCH 31.5 26.0 - 34.0 pg   MCHC 32.6 30.0 - 36.0 g/dL   RDW 13.0 11.5 - 15.5 %    Platelets 232 150 - 400 K/uL   Neutrophils Relative % 80 (H) 43 - 77 %   Neutro Abs 7.7 1.7 - 7.7 K/uL   Lymphocytes Relative 11 (L) 12 - 46 %   Lymphs Abs 1.1 0.7 - 4.0  K/uL   Monocytes Relative 7 3 - 12 %   Monocytes Absolute 0.7 0.1 - 1.0 K/uL   Eosinophils Relative 2 0 - 5 %   Eosinophils Absolute 0.2 0.0 - 0.7 K/uL   Basophils Relative 0 0 - 1 %   Basophils Absolute 0.0 0.0 - 0.1 K/uL   Labs are reviewed and are pertinent for low TSH.  Current Facility-Administered Medications  Medication Dose Route Frequency Provider Last Rate Last Dose  . 0.9 %  sodium chloride infusion  250 mL Intravenous PRN Juanito Doom, MD   Stopped at 10/20/14 0800  . acetaminophen (TYLENOL) tablet 650 mg  650 mg Oral Q4H PRN Juanito Doom, MD   650 mg at 10/21/14 1133  . enoxaparin (LOVENOX) injection 30 mg  30 mg Subcutaneous Q24H Charmian Muff Kremmling, RPH   30 mg at 10/21/14 1824  . feeding supplement (ENSURE COMPLETE) (ENSURE COMPLETE) liquid 237 mL  237 mL Oral TID BM Dalene Carrow, RD   237 mL at 10/21/14 1046  . lamoTRIgine (LAMICTAL) tablet 50 mg  50 mg Oral Daily Tammy S Parrett, NP   50 mg at 10/21/14 1046  . levETIRAcetam (KEPPRA) tablet 750 mg  750 mg Oral Q12H Maud Deed McCallister, RPH   750 mg at 10/21/14 2207  . LORazepam (ATIVAN) injection 1-2 mg  1-2 mg Intravenous Q2H PRN Juanito Doom, MD   1 mg at 10/19/14 2217  . ondansetron (ZOFRAN) injection 4 mg  4 mg Intravenous Q6H PRN Juanito Doom, MD   4 mg at 10/19/14 1622  . pregabalin (LYRICA) capsule 75 mg  75 mg Oral BID Marliss Coots, PA-C   75 mg at 10/21/14 2207  . zonisamide (ZONEGRAN) capsule 300 mg  300 mg Oral BID Roland Rack, MD   300 mg at 10/21/14 2208    Psychiatric Specialty Exam: Physical Exam as per history and physical  ROS depression, anxiety and insomnia  Blood pressure 131/87, pulse 84, temperature 98.2 F (36.8 C), temperature source Oral, resp. rate 18, height '5\' 4"'  (1.626 m), weight  45.405 kg (100 lb 1.6 oz), SpO2 99 %.Body mass index is 17.17 kg/(m^2).  General Appearance: Guarded  Eye Contact::  Good  Speech:  Clear and Coherent and Slow  Volume:  Decreased  Mood:  Anxious and Depressed  Affect:  Appropriate and Congruent  Thought Process:  Coherent and Goal Directed  Orientation:  Full (Time, Place, and Person)  Thought Content:  Rumination  Suicidal Thoughts:  Yes.  with intent/plan  Homicidal Thoughts:  No  Memory:  Immediate;   Fair Recent;   Fair  Judgement:  Impaired  Insight:  Lacking  Psychomotor Activity:  Decreased  Concentration:  Fair  Recall:  [employer] of Knowledge:Fair  Language: Good  Akathisia:  NA  Handed:  Right  AIMS (if indicated):     Assets:  Communication Skills Desire for Improvement Financial Resources/Insurance Housing Intimacy Leisure Time Resilience Social Support Talents/Skills Transportation  Sleep:      Musculoskeletal: Strength & Muscle Tone: decreased Gait & Station: normal Patient leans: N/A  Treatment Plan Summary: Daily contact with patient to assess and evaluate symptoms and progress in treatment Medication management Recommend acute psych admission and refer to psych social service for appropriate psych placement  Yuki Brunsman,JANARDHAHA R. 10/22/2014 10:19 AM

## 2014-10-22 NOTE — Progress Notes (Signed)
Patient ID: [name]  female  WUJ:[id number]    DOB: [birth year]    DOA: 10/18/2014  PCP: Nelwyn SalisburyFRY,STEPHEN A, MD  Brief history of present illness Patient is a 48 year old female with history of anxiety, depression, seizure disorder, hypertension was admitted on 10/18/14 from ED after a drug overdose with the Ambien and benzodiazepines requiring intubation and mechanical ventilation. CT of the head showed no acute intracranial process.  Patient was extubated on 11/16. Neurology was consulted due to seizure disorder. Patient was transferred to the floor on 11/17  Assessment/Plan: Principal Problem:   Acute encephalopathy secondary to overdose on benzodiazepine, Ambien:due to suicide attempt, patient has prior multiple suicide attempts-  Acute encephalopathy has resolved, patient was extubated on 11/16 Patient was seen by psychiatry, recommended disposition to acute psych facility Currently medically clear  Active Problems:   Acute respiratory failure with hypoxia- - resolved, extubated on 11/16  Mild sinus bradycardia likely related to overdose - Resolved, QTC improved to 447  Leukocytosis - likely stressed emargination, resolved  Acute renal failure - resolved  Seizures - neurology following closely, EEG was done which showed area off potential epileptogenic density in the right parieto-occipital region, no seizure recorded on the study - continue home Lyrica, Lamictal, Zonegran, Keppra  Underweight/anorexia: BMI 17.2 - Encouraged diet, added nutritional supplements  DVT Prophylaxis:Lovenox  Code Status:full code  Family Communication:discussed with mother at the bedside  Disposition: medically clear, [facility]  Consultants:  Pulmonary critical care admitted the patient  Neurology  Psychiatry  Procedures:  Mechanical ventilation  EEG  Antibiotics:  none    Subjective: Patient seen and examined, no complaints at this  time, ambulating  Objective: Weight change: 0.036 kg (1.3 oz)  Intake/Output Summary (Last 24 hours) at 10/22/14 1343 Last data filed at 10/22/14 1325  Gross per 24 hour  Intake    740 ml  Output    600 ml  Net    140 ml   Blood pressure 145/90, pulse 90, temperature 98.2 F (36.8 C), temperature source Oral, resp. rate 18, height 5\' 4"  (1.626 m), weight 45.405 kg (100 lb 1.6 oz), SpO2 98 %.  Physical Exam: General: Alert and awake, oriented x3, not in any acute distress, cachectic appearing. HEENT: anicteric sclera, PERLA, EOMI CVS: S1-S2 clear, no murmur rubs or gallops Chest: clear to auscultation bilaterally, no wheezing, rales or rhonchi Abdomen: soft nontender, nondistended, normal bowel sounds  Extremities: no cyanosis, clubbing or edema noted bilaterally Neuro: Cranial nerves II-XII intact, no focal neurological deficits  Lab Results: Basic Metabolic Panel:  Recent Labs Lab 10/19/14 0320  10/20/14 0229  10/21/14 0235 10/22/14 0530  NA 142  < > 141  < > 137 140  K 4.3  < > 4.9  < > 3.7 4.0  CL 110  < > 110  < > 108 112  CO2 19  < > 14*  < > 16* 18*  GLUCOSE 83  < > 98  < > 113* 92  BUN 18  < > 19  < > 15 13  CREATININE 1.13*  < > 0.95  < > 0.90 0.89  CALCIUM 8.1*  < > 9.2  < > 9.0 8.9  MG 1.7  < > 2.1  --   --   --   PHOS 2.7  --   --   --   --   --   < > = values in this interval not displayed. Liver Function Tests:  Recent Labs Lab 10/18/14 1627  AST 20  ALT 12  ALKPHOS 67  BILITOT 0.3  PROT 6.3  ALBUMIN 3.0*   No results for input(s): LIPASE, AMYLASE in the last 168 hours.  Recent Labs Lab 10/20/14 1818  AMMONIA 28   CBC:  Recent Labs Lab 10/21/14 0235 10/22/14 0530  WBC 14.9* 9.7  NEUTROABS 13.1* 7.7  HGB 11.4* 11.4*  HCT 34.1* 35.0*  MCV 95.8 96.7  PLT 194 232   Cardiac Enzymes:  Recent Labs Lab 10/18/14 2038  CKTOTAL 274*   BNP: Invalid input(s): POCBNP CBG:  Recent Labs Lab 10/18/14 1559 10/18/14 2004  10/19/14 1351  GLUCAP 195* 101* 122*     Micro Results: Recent Results (from the past 240 hour(s))  MRSA PCR Screening     Status: None   Collection Time: 10/18/14  8:05 PM  Result Value Ref Range Status   MRSA by PCR NEGATIVE NEGATIVE Final    Comment:        The GeneXpert MRSA Assay (FDA approved for NASAL specimens only), is one component of a comprehensive MRSA colonization surveillance program. It is not intended to diagnose MRSA infection nor to guide or monitor treatment for MRSA infections.     Studies/Results: Ct Head Wo Contrast  10/19/2014   CLINICAL DATA:  Acute respiratory failure with hypoxia J96.01 (ICD-10-CM) Acute encephalopathy G93.40 (ICD-10-CM)  EXAM: CT HEAD WITHOUT CONTRAST  TECHNIQUE: Contiguous axial images were obtained from the base of the skull through the vertex without intravenous contrast.  COMPARISON:  1 day prior  FINDINGS: Sinuses/Soft tissues: Fluid in the sphenoid sinus. Clear mastoid air cells.  Intracranial: No mass lesion, hemorrhage, hydrocephalus, acute infarct, intra-axial, or extra-axial fluid collection.  IMPRESSION: Normal head CT.   Electronically Signed   By: Jeronimo Greaves M.D.   On: 10/19/2014 16:20   Ct Head Wo Contrast  10/18/2014   CLINICAL DATA:  Post CPR, history of seizures, altered mental status  EXAM: CT HEAD WITHOUT CONTRAST  CT CERVICAL SPINE WITHOUT CONTRAST  TECHNIQUE: Multidetector CT imaging of the head and cervical spine was performed following the standard protocol without intravenous contrast. Multiplanar CT image reconstructions of the cervical spine were also generated.  COMPARISON:  None.  FINDINGS: CT HEAD FINDINGS  No skull fracture is noted. Mastoid air cells are unremarkable. There is mucosal thickening with partial opacification bilateral ethmoid air cells. Mucosal thickening with air-fluid level noted bilateral sphenoid sinus. Abundant secretions are noted in nasopharynx.  No intracranial hemorrhage, mass effect  or midline shift. The gray and white-matter differentiation is preserved. No acute cortical infarction. No mass lesion is noted on this unenhanced scan.  CT CERVICAL SPINE FINDINGS  Axial images shows no acute fracture or subluxation. Computer processed images shows cervical spine shows alignment, disc spaces and vertebral body heights to be preserved. No acute fracture or subluxation. Endotracheal and NG tube in place is noted.  There is no pneumothorax in visualized lung apices. Bilateral apical Mild pleural parenchymal scarring.  IMPRESSION: 1. No acute intracranial abnormality. 2. There is mucosal thickening with partial opacification bilateral ethmoid air cells. Mucosal thickening with air-fluid level bilateral sphenoid sinus. Abundant secretions are noted in nasopharynx. 3. No cervical spine acute fracture or subluxation. NG tube and endotracheal tube partially visualized.   Electronically Signed   By: Natasha Mead M.D.   On: 10/18/2014 17:17   Ct Cervical Spine Wo Contrast  10/18/2014   CLINICAL DATA:  Post CPR, history of seizures, altered mental status  EXAM: CT HEAD WITHOUT CONTRAST  CT CERVICAL SPINE WITHOUT CONTRAST  TECHNIQUE: Multidetector CT imaging of the head and cervical spine was performed following the standard protocol without intravenous contrast. Multiplanar CT image reconstructions of the cervical spine were also generated.  COMPARISON:  None.  FINDINGS: CT HEAD FINDINGS  No skull fracture is noted. Mastoid air cells are unremarkable. There is mucosal thickening with partial opacification bilateral ethmoid air cells. Mucosal thickening with air-fluid level noted bilateral sphenoid sinus. Abundant secretions are noted in nasopharynx.  No intracranial hemorrhage, mass effect or midline shift. The gray and white-matter differentiation is preserved. No acute cortical infarction. No mass lesion is noted on this unenhanced scan.  CT CERVICAL SPINE FINDINGS  Axial images shows no acute fracture or  subluxation. Computer processed images shows cervical spine shows alignment, disc spaces and vertebral body heights to be preserved. No acute fracture or subluxation. Endotracheal and NG tube in place is noted.  There is no pneumothorax in visualized lung apices. Bilateral apical Mild pleural parenchymal scarring.  IMPRESSION: 1. No acute intracranial abnormality. 2. There is mucosal thickening with partial opacification bilateral ethmoid air cells. Mucosal thickening with air-fluid level bilateral sphenoid sinus. Abundant secretions are noted in nasopharynx. 3. No cervical spine acute fracture or subluxation. NG tube and endotracheal tube partially visualized.   Electronically Signed   By: Natasha MeadLiviu  Pop M.D.   On: 10/18/2014 17:17   Dg Chest Port 1 View  10/18/2014   CLINICAL DATA:  intubation, post cardiopulmonary resuscitation  EXAM: PORTABLE CHEST - 1 VIEW  COMPARISON:  01/04/2007  FINDINGS: Cardiomediastinal silhouette is stable. Mild hyperinflation. Endotracheal tube in place with tip 2 cm above the carina. NG tube in place with tip in mid stomach. No acute infiltrate or pulmonary edema. No pneumothorax.  IMPRESSION: No active disease.  Endotracheal tube in place.  No pneumothorax.   Electronically Signed   By: Natasha MeadLiviu  Pop M.D.   On: 10/18/2014 17:12   Dg Chest Port 1v Same Day  10/19/2014   CLINICAL DATA:  Fever  EXAM: PORTABLE CHEST - 1 VIEW SAME DAY  COMPARISON:  10/18/2014  FINDINGS: Cardiomediastinal silhouette is stable. Mild hyperinflation. No acute infiltrate or pulmonary edema. Endotracheal and NG tube has been removed. No pneumothorax.  IMPRESSION: No active disease. Endotracheal and NG tube has been removed. No pneumothorax.   Electronically Signed   By: Natasha MeadLiviu  Pop M.D.   On: 10/19/2014 13:46    Medications: Scheduled Meds: . enoxaparin (LOVENOX) injection  30 mg Subcutaneous Q24H  . feeding supplement (ENSURE COMPLETE)  237 mL Oral TID BM  . lamoTRIgine  50 mg Oral Daily  .  levETIRAcetam  750 mg Oral Q12H  . pregabalin  75 mg Oral BID  . zonisamide  300 mg Oral BID      LOS: 4 days   RAI,RIPUDEEP M.D. [facility] 10/22/2014, 1:43 PM Pager: [phone]  If 7PM-7AM, please contact night-coverage [email or web address] Password [email or web address]

## 2014-10-23 MED ORDER — LEVETIRACETAM 750 MG PO TABS: 750.0000 mg | ORAL_TABLET | Freq: Two times a day (BID) | ORAL | Status: DC

## 2014-10-23 NOTE — Discharge Summary (Signed)
Physician Discharge Summary  Patient ID: [name] MRN: [id number] DOB/AGE: 48-Jan-1967 48 y.o.  Admit date: 10/18/2014 Discharge date: 10/23/2014  Primary Care Physician:  Nelwyn Salisbury, MD  Discharge Diagnoses:    . Acute respiratory failure with hypoxia . Suicide and self-inflicted injury . Drug overdose . Leukocytosis . Seizures . Sinus bradycardia  Consults:  Pulmonary critical care Neurology, Dr. Amada Jupiter Psychiatry, Dr. Magdalen Spatz   Recommendations for Outpatient Follow-up:   Patient will likely benefit from a neurology referral outpatient for close follow up on seizure medications   Allergies:   Allergies  Allergen Reactions  . Vimpat [Lacosamide]     Increased depression     Discharge Medications:   Medication List    STOP taking these medications        alprazolam 2 MG tablet  Commonly known as:  XANAX     HYDROcodone-acetaminophen 5-325 MG per tablet  Commonly known as:  NORCO/VICODIN     metoprolol succinate 100 MG 24 hr tablet  Commonly known as:  TOPROL-XL     zolpidem 10 MG tablet  Commonly known as:  AMBIEN      TAKE these medications        lamoTRIgine 25 MG tablet  Commonly known as:  LAMICTAL  Take 2 tablets (50 mg total) by mouth daily.     levETIRAcetam 750 MG tablet  Commonly known as:  KEPPRA  Take 1 tablet (750 mg total) by mouth 2 (two) times daily. For 2 weeks, then discontinue     pregabalin 75 MG capsule  Commonly known as:  LYRICA  Take 1 capsule (75 mg total) by mouth 2 (two) times daily.     PREMARIN 0.625 MG tablet  Generic drug:  estrogens (conjugated)  TAKE 1 TABLET DAILY FOR 21 DAYS THEN DO NOT TAKE FOR 7 DAYS.     zonisamide 100 MG capsule  Commonly known as:  ZONEGRAN  Take 3 capsules (300 mg total) by mouth 2 (two) times daily.         Brief H and P: For complete details please refer to admission H and P, but in brief patient is a 48 year old female with medical history significant  for depression, seizures was admitted on 10/18/14 from the ER after a drug overdose, requiring intubation and mechanical ventilation. Patient was admitted to intensive care unit by pulmonary critical care.  Hospital Course:  Patient is a 48 year old female with history of anxiety, depression, seizure disorder, hypertension was admitted on 10/18/14 from ED after a drug overdose with Ambien and benzodiazepine requiring intubation and mechanical ventilation. CT of the head showed no acute intracranial process. Patient was extubated on 11/16. Neurology was consulted due to seizure disorder. She was transferred to the floor on 11/17.  Acute encephalopathy secondary to overdose on Xanax and Ambien: Suicide attempt with history of prior multiple suicide attempts Patient has been extubated on 11/16, acute encephalopathy has resolved. Psychiatry was consulted. Per psychiatry, she needs disposition to acute psychiatric facility. Currently patient is medically cleared, awaiting disposition.  Acute respiratory failure with hypoxia: Due to drug overdose, resolved, extubated on 11/16  Mild sinus bradycardia likely related to overdose Resolved, QTC improved to 447, beta blocker on hold secondary to bradycardia  Leukocytosis: Likely stressed demargination, resolved, no fevers or any infectious source  Acute kidney injury Resolved  Seizures Patient was seen closely by neurology, EEG was done which showed area of potential epileptogenic density in the right parieto-occipital region, no seizure recorded on the study.  Neurology recommended to continue home dose of Lyrica, Lamictal, Zonegran. Patient was started on Keppra 750 mg twice a day for 2 weeks then discontinue per neurology recommendations.  Underweight/anorexia: BMI 17.2: Encouraged strongly for healthy diet added nutritional supplements   Day of Discharge BP 135/85 mmHg  Pulse 83  Temp(Src) 98.5 F (36.9 C) (Oral)  Resp 18  Ht 5\' 4"  (1.626 m)   Wt 43.954 kg (96 lb 14.4 oz)  BMI 16.62 kg/m2  SpO2 99%  Physical Exam: General: Alert and awake oriented x3 not in any acute distress. HEENT: anicteric sclera, pupils reactive to light and accommodation CVS: S1-S2 clear no murmur rubs or gallops Chest: clear to auscultation bilaterally, no wheezing rales or rhonchi Abdomen: soft nontender, nondistended, normal bowel sounds Extremities: no cyanosis, clubbing or edema noted bilaterally Neuro: Cranial nerves II-XII intact, no focal neurological deficits   The results of significant diagnostics from this hospitalization (including imaging, microbiology, ancillary and laboratory) are listed below for reference.    LAB RESULTS: Basic Metabolic Panel:  Recent Labs Lab 10/19/14 0320  10/20/14 0229  10/21/14 0235 10/22/14 0530  NA 142  < > 141  < > 137 140  K 4.3  < > 4.9  < > 3.7 4.0  CL 110  < > 110  < > 108 112  CO2 19  < > 14*  < > 16* 18*  GLUCOSE 83  < > 98  < > 113* 92  BUN 18  < > 19  < > 15 13  CREATININE 1.13*  < > 0.95  < > 0.90 0.89  CALCIUM 8.1*  < > 9.2  < > 9.0 8.9  MG 1.7  < > 2.1  --   --   --   PHOS 2.7  --   --   --   --   --   < > = values in this interval not displayed. Liver Function Tests:  Recent Labs Lab 10/18/14 1627  AST 20  ALT 12  ALKPHOS 67  BILITOT 0.3  PROT 6.3  ALBUMIN 3.0*   No results for input(s): LIPASE, AMYLASE in the last 168 hours.  Recent Labs Lab 10/20/14 1818  AMMONIA 28   CBC:  Recent Labs Lab 10/21/14 0235 10/22/14 0530  WBC 14.9* 9.7  NEUTROABS 13.1* 7.7  HGB 11.4* 11.4*  HCT 34.1* 35.0*  MCV 95.8 96.7  PLT 194 232   Cardiac Enzymes:  Recent Labs Lab 10/18/14 2038  CKTOTAL 274*   BNP: Invalid input(s): POCBNP CBG:  Recent Labs Lab 10/18/14 2004 10/19/14 1351  GLUCAP 101* 122*    Significant Diagnostic Studies:  Ct Head Wo Contrast  10/19/2014   CLINICAL DATA:  Acute respiratory failure with hypoxia J96.01 (ICD-10-CM) Acute encephalopathy  G93.40 (ICD-10-CM)  EXAM: CT HEAD WITHOUT CONTRAST  TECHNIQUE: Contiguous axial images were obtained from the base of the skull through the vertex without intravenous contrast.  COMPARISON:  1 day prior  FINDINGS: Sinuses/Soft tissues: Fluid in the sphenoid sinus. Clear mastoid air cells.  Intracranial: No mass lesion, hemorrhage, hydrocephalus, acute infarct, intra-axial, or extra-axial fluid collection.  IMPRESSION: Normal head CT.   Electronically Signed   By: Jeronimo GreavesKyle  Talbot M.D.   On: 10/19/2014 16:20   Ct Head Wo Contrast  10/18/2014   CLINICAL DATA:  Post CPR, history of seizures, altered mental status  EXAM: CT HEAD WITHOUT CONTRAST  CT CERVICAL SPINE WITHOUT CONTRAST  TECHNIQUE: Multidetector CT imaging of the head and cervical spine  was performed following the standard protocol without intravenous contrast. Multiplanar CT image reconstructions of the cervical spine were also generated.  COMPARISON:  None.  FINDINGS: CT HEAD FINDINGS  No skull fracture is noted. Mastoid air cells are unremarkable. There is mucosal thickening with partial opacification bilateral ethmoid air cells. Mucosal thickening with air-fluid level noted bilateral sphenoid sinus. Abundant secretions are noted in nasopharynx.  No intracranial hemorrhage, mass effect or midline shift. The gray and white-matter differentiation is preserved. No acute cortical infarction. No mass lesion is noted on this unenhanced scan.  CT CERVICAL SPINE FINDINGS  Axial images shows no acute fracture or subluxation. Computer processed images shows cervical spine shows alignment, disc spaces and vertebral body heights to be preserved. No acute fracture or subluxation. Endotracheal and NG tube in place is noted.  There is no pneumothorax in visualized lung apices. Bilateral apical Mild pleural parenchymal scarring.  IMPRESSION: 1. No acute intracranial abnormality. 2. There is mucosal thickening with partial opacification bilateral ethmoid air cells. Mucosal  thickening with air-fluid level bilateral sphenoid sinus. Abundant secretions are noted in nasopharynx. 3. No cervical spine acute fracture or subluxation. NG tube and endotracheal tube partially visualized.   Electronically Signed   By: Natasha MeadLiviu  Pop M.D.   On: 10/18/2014 17:17   Ct Cervical Spine Wo Contrast  10/18/2014   CLINICAL DATA:  Post CPR, history of seizures, altered mental status  EXAM: CT HEAD WITHOUT CONTRAST  CT CERVICAL SPINE WITHOUT CONTRAST  TECHNIQUE: Multidetector CT imaging of the head and cervical spine was performed following the standard protocol without intravenous contrast. Multiplanar CT image reconstructions of the cervical spine were also generated.  COMPARISON:  None.  FINDINGS: CT HEAD FINDINGS  No skull fracture is noted. Mastoid air cells are unremarkable. There is mucosal thickening with partial opacification bilateral ethmoid air cells. Mucosal thickening with air-fluid level noted bilateral sphenoid sinus. Abundant secretions are noted in nasopharynx.  No intracranial hemorrhage, mass effect or midline shift. The gray and white-matter differentiation is preserved. No acute cortical infarction. No mass lesion is noted on this unenhanced scan.  CT CERVICAL SPINE FINDINGS  Axial images shows no acute fracture or subluxation. Computer processed images shows cervical spine shows alignment, disc spaces and vertebral body heights to be preserved. No acute fracture or subluxation. Endotracheal and NG tube in place is noted.  There is no pneumothorax in visualized lung apices. Bilateral apical Mild pleural parenchymal scarring.  IMPRESSION: 1. No acute intracranial abnormality. 2. There is mucosal thickening with partial opacification bilateral ethmoid air cells. Mucosal thickening with air-fluid level bilateral sphenoid sinus. Abundant secretions are noted in nasopharynx. 3. No cervical spine acute fracture or subluxation. NG tube and endotracheal tube partially visualized.    Electronically Signed   By: Natasha MeadLiviu  Pop M.D.   On: 10/18/2014 17:17   Dg Chest Port 1 View  10/18/2014   CLINICAL DATA:  intubation, post cardiopulmonary resuscitation  EXAM: PORTABLE CHEST - 1 VIEW  COMPARISON:  01/04/2007  FINDINGS: Cardiomediastinal silhouette is stable. Mild hyperinflation. Endotracheal tube in place with tip 2 cm above the carina. NG tube in place with tip in mid stomach. No acute infiltrate or pulmonary edema. No pneumothorax.  IMPRESSION: No active disease.  Endotracheal tube in place.  No pneumothorax.   Electronically Signed   By: Natasha MeadLiviu  Pop M.D.   On: 10/18/2014 17:12   Dg Chest Port 1v Same Day  10/19/2014   CLINICAL DATA:  Fever  EXAM: PORTABLE CHEST - 1 VIEW  SAME DAY  COMPARISON:  10/18/2014  FINDINGS: Cardiomediastinal silhouette is stable. Mild hyperinflation. No acute infiltrate or pulmonary edema. Endotracheal and NG tube has been removed. No pneumothorax.  IMPRESSION: No active disease. Endotracheal and NG tube has been removed. No pneumothorax.   Electronically Signed   By: Natasha Mead M.D.   On: 10/19/2014 13:46       Disposition and Follow-up: Discharge Instructions    Diet - low sodium heart healthy    Complete by:  As directed             DISPOSITION: Awaiting [facility]  DIET: Heart healthy diet   DISCHARGE FOLLOW-UP Follow-up Information    Follow up with FRY,STEPHEN A, MD. Schedule an appointment as soon as possible for a visit in 2 weeks.   Specialty:  Family Medicine   Why:  for hospital follow-up   Contact information:   [address] Christena Flake [facility] [place] [address] [phone]       Time spent on Discharge: 40 mins  Signed:   Aarushi Hemric M.D. [facility] 10/23/2014, 12:06 PM Pager: [phone]

## 2014-10-23 NOTE — Clinical Social Work Psych Note (Signed)
Psych CSW has completed assessment and has sent referrals to the following [facility]: [facility]  Psych CSW will continue to assist with placement and psychiatrist recommendations.  Vickii PennaGina Camy Leder, LCSWA [phone]  Psychiatric & Orthopedics (5N 1-16) Clinical Social Worker

## 2014-10-23 NOTE — Clinical Social Work Psychosocial (Signed)
Clinical Social Work Department CLINICAL SOCIAL WORK PSYCHIATRY SERVICE LINE ASSESSMENT 10/23/2014  Patient:  [name]  Account:  [id number]  Admit Date:  10/18/2014  Clinical Social Worker:  Read DriversEGINA Ines Rebel, LCSWA  Date/Time:  10/23/2014 01:57 PM Referred by:  Physician  Date referred:  10/23/2014 Reason for Referral  Crisis Intervention  Psychosocial assessment   Presenting Symptoms/Problems (In the person's/family's own words):   [facility] after sucide attempt via overdose   Abuse/Neglect/Trauma History (check all that apply)  Denies history   Abuse/Neglect/Trauma Comments:   patient denies   Psychiatric History (check all that apply)  Inpatient/hospitilization   Psychiatric medications:  Ambien  Xanex  lamictal  restoril   Current Mental Health Hospitalizations/Previous Mental Health History:   08/03/2013 [facility] attempted OD  03/16/2014 [facility] attempted OD  10/07/2008 [facility] attempted OD   Current provider:   no outpatient MH services in place   Place and Date:   none   Current Medications:   alprazolam (XANAX) 2 MG tablet  TAKE 1 TABLET BY MOUTH 3 TIMES A DAY AS NEEDED        Nelwyn SalisburyStephen A Fry, MD   azithromycin (ZITHROMAX) 250 MG tablet  As directed  09/01/14      Nelwyn SalisburyStephen A Fry, MD   HYDROcodone-acetaminophen (NORCO/VICODIN) 5-325 MG per tablet  Take 1 tablet by mouth every 6 (six) hours as needed.  10/08/14      York Spanielharles K Willis, MD   lamoTRIgine (LAMICTAL) 25 MG tablet  Take 2 tablets (50 mg total) by mouth daily.  10/06/14      Nelwyn SalisburyStephen A Fry, MD   metoprolol succinate (TOPROL-XL) 100 MG 24 hr tablet  Take 1 tablet (100 mg total) by mouth daily. Take with or immediately following a meal.  09/10/14      Nelwyn SalisburyStephen A Fry, MD   metoprolol succinate (TOPROL-XL) 50 MG 24 hr tablet    09/01/14      Historical Provider, MD   pregabalin (LYRICA) 75 MG capsule  Take 1 capsule (75 mg total) by mouth 2 (two) times daily.  10/03/14      York Spanielharles K Willis, MD   PREMARIN 0.625  MG tablet  TAKE 1 TABLET DAILY FOR 21 DAYS THEN DO NOT TAKE FOR 7 DAYS.  02/10/14      Nelwyn SalisburyStephen A Fry, MD   temazepam (RESTORIL) 30 MG capsule    08/25/14      Historical Provider, MD   zolpidem (AMBIEN) 10 MG tablet  Take 2 tablets (20 mg total) by mouth at bedtime as needed for sleep.  09/10/14  10/10/14    Nelwyn SalisburyStephen A Fry, MD   zonisamide (ZONEGRAN) 100 MG capsule  Take 3 capsules (300 mg total) by mouth 2 (two) times daily.  03/27/14      York Spanielharles K Willis, MD    Previous Impatient Admission/Date/Reason:   [facility]  [facility]  both OD attempts   Emotional Health / Current Symptoms    Suicide/Self Harm  Has a plan for suicide  Suicidal ideation (ex: "I can't take any more,I wish I could disappear")  Suicide attempt in past (date/description)   Suicide attempt in the past:   Patient has had 3 OD attempts in the past.  Most recent 03/2014   Other harmful behavior:   none   Psychotic/Dissociative Symptoms  None reported   Other Psychotic/Dissociative Symptoms:   none reported or noted in the chart    Attention/Behavioral Symptoms  Withdrawn  Impulsive   Other Attention / Behavioral Symptoms:   none reported or noted in the chart    Cognitive Impairment  Orientation - Place  Orientation - Self  Orientation - Situation  Orientation - Time   Other Cognitive Impairment:   none reported or noted in the chart    Mood and Adjustment  DEPRESSION  Flat  Guarded    Stress, Anxiety, Trauma, Any Recent Loss/Stressor  Other - See comment   Anxiety (frequency):   Stressors: youngest son moved away from home; 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; job stressors- "I don't like my job"   Phobia (specify):   none reported or noted in the chart   Compulsive behavior (specify):   none reported or noted in the chart   Obsessive behavior (specify):   none reported or noted in the chart   Other:   none other than those listed above   Substance Abuse/Use  None   SBIRT completed (please  refer for detailed history):  N  Self-reported substance use:   pt denies   Urinary Drug Screen Completed:  N Alcohol level:   untested  pt admits to social drinking    Environmental/Housing/Living Arrangement  Stable housing  With Family Member   Who is in the home:   husband [name]   Emergency contact:  [name] husband [phone]   Financial  Stable Income  Private Insurance   Patient's Strengths and Goals (patient's own words):   Patient has supportive family including mother, father, husband and sons.  Patient has medical insurnace and insight regarding her mental illness.   Clinical Social Worker's Interpretive Summary:   Psych CSW assessed pt at bedside.  Pt is alert and oriented x4 at the time of the assessment.  Patient's mother was at bedside during the course of the assessment per patient's request.    Patient was admitted to [facility] hospital after presenting to ED after being found unresponsive by son.  Patient had taken approximately 30 ambien and approximatly 13 xanex. Patient admits to wanting to end her life.  Patient reports husband has medication "hidden" and dispenses it to the patient once it is due.  Patient states that she has no idea how she found the medication.    patient has had 3 past suicide attempts-OD.  Patient states that her husband has been dispensing her meds since her last [facility] admission.  Patient states that she seldom has SI, but does report passive ideation since 2009.  Patient states that when she has those thoughts, she uses her tools given during admission at [facility] to control or change the thoughts into positive and the thoughts leave with no further compulsion.    Patient has strong support with husband at home.  Mother and father are also very supportive and state they wish for patient to return home with outpatient follow-up and mother would be able to provide 24 hour supervision.  This information will be presented to the psychiatrist by patient and  family.  Psych CSW made patient aware that with the history of multiple attempts and the seriousness of these attempts- Of report, patient was intubated and on vent after admission to the hospital, the patient is considered high risk and discharging home may not be an option.   At this point, the recommendation is inpatient psychiatric admission.  The patient is aware and not agreeable, though states she will do what the doctor says is best.    Patient denies AVHD.  Patient denies  current SI/HI. Patient denies SA, but admits to being a social drinker with maybe one-two drinks per month.    Psych CSW will complete bed search.  Psych CSW will also ask the psychiatrist to follow-up with re-evaluation on 10/24/2014.   Disposition:  Inpatient referral made [facility], Geri-psych)   Vickii PennaGina Ronnell Clinger, [place] [phone]  Psychiatric & Orthopedics (5N 1-16) Clinical Social Worker

## 2014-10-23 NOTE — Plan of Care (Signed)
Problem: Phase II Progression Outcomes Goal: Progress activity as tolerated unless otherwise ordered Outcome: Completed/Met Date Met:  10/23/14 Goal: Discharge plan established Outcome: Completed/Met Date Met:  10/23/14

## 2014-10-24 MED ORDER — LEVETIRACETAM 750 MG PO TABS: 750.0000 mg | ORAL_TABLET | Freq: Two times a day (BID) | ORAL | Status: DC

## 2014-10-24 MED ORDER — PREGABALIN 75 MG PO CAPS: 75.0000 mg | ORAL_CAPSULE | Freq: Two times a day (BID) | ORAL | Status: DC

## 2014-10-24 MED ORDER — LAMOTRIGINE 25 MG PO TABS: 50.0000 mg | ORAL_TABLET | Freq: Every day | ORAL | Status: DC

## 2014-10-24 NOTE — Discharge Instructions (Signed)
[facility] Call for appointment intensive outpatient   Monday at 12 assessment with Charmian MuffAnn Evans   [phone]

## 2014-10-24 NOTE — Plan of Care (Signed)
Problem: Phase II Progression Outcomes Goal: IV changed to normal saline lock Outcome: Completed/Met Date Met:  10/24/14 Goal: Obtain order to discontinue catheter if appropriate Outcome: Completed/Met Date Met:  10/24/14  Problem: Phase III Progression Outcomes Goal: Pain controlled on oral analgesia Outcome: Completed/Met Date Met:  10/24/14 Goal: Activity at appropriate level-compared to baseline (UP IN CHAIR FOR HEMODIALYSIS)  Outcome: Completed/Met Date Met:  10/24/14 Goal: Voiding independently Outcome: Completed/Met Date Met:  10/24/14 Goal: Foley discontinued Outcome: Completed/Met Date Met:  10/24/14

## 2014-10-24 NOTE — Discharge Planning (Signed)
Patient discharged home in stable condition. Verbalizes understanding of all discharge instructions, including home medications and follow up appointments. 

## 2014-10-24 NOTE — Discharge Summary (Signed)
Physician Discharge Summary  Patient ID: Philipp OvensMelinda S XXXLechner MRN: [id number] DOB/AGE: Feb 24, 1966 48 y.o.  Admit date: 10/18/2014 Discharge date: 10/24/2014  Primary Care Physician:  Nelwyn SalisburyFRY,STEPHEN A, MD  Discharge Diagnoses:    . Acute respiratory failure with hypoxia . Suicide and self-inflicted injury . Drug overdose . Leukocytosis . Seizures . Sinus bradycardia  Consults:  Pulmonary critical care Neurology, Dr. Amada JupiterKirkpatrick Psychiatry, Dr. Sheryle SprayJonalagada  Recommendations for Outpatient Follow-up:  Patient has appointment on Monday 10/27/14 and 12 known [facility] She will also likely benefit from a neurology referral outpatient for close follow-up on seizure medications  Allergies:   Allergies  Allergen Reactions  . Vimpat [Lacosamide]     Increased depression     Discharge Medications:   Medication List    STOP taking these medications        alprazolam 2 MG tablet  Commonly known as:  XANAX     HYDROcodone-acetaminophen 5-325 MG per tablet  Commonly known as:  NORCO/VICODIN     metoprolol succinate 100 MG 24 hr tablet  Commonly known as:  TOPROL-XL     zolpidem 10 MG tablet  Commonly known as:  AMBIEN      TAKE these medications        lamoTRIgine 25 MG tablet  Commonly known as:  LAMICTAL  Take 2 tablets (50 mg total) by mouth daily.     levETIRAcetam 750 MG tablet  Commonly known as:  KEPPRA  Take 1 tablet (750 mg total) by mouth 2 (two) times daily. For 2 weeks, then discontinue     pregabalin 75 MG capsule  Commonly known as:  LYRICA  Take 1 capsule (75 mg total) by mouth 2 (two) times daily.     PREMARIN 0.625 MG tablet  Generic drug:  estrogens (conjugated)  TAKE 1 TABLET DAILY FOR 21 DAYS THEN DO NOT TAKE FOR 7 DAYS.     zonisamide 100 MG capsule  Commonly known as:  ZONEGRAN  Take 3 capsules (300 mg total) by mouth 2 (two) times daily.         Brief H and P: For complete details please refer to  admission H and P, but in brief patient is a 48 year old female with medical history significant for depression, seizures was admitted on 11/14-15 from the ER after drug overdose, requiring intubation and mechanical ventilation. Patient was admitted to intensive care unit by pulmonary critical care.  Hospital Course:  Patient is a 48 year old female with history of anxiety, depression, seizure disorder, hypertension was admitted on 10/18/14 from ED after a drug overdose with Ambien and benzodiazepine requiring intubation and mechanical ventilation. CT of the head showed no acute intracranial process. Patient was extubated on 11/16. Neurology was consulted due to seizure disorder. She was transferred to the floor on 11/17.  Acute encephalopathy secondary to overdose on Xanax and Ambien: Suicide attempt with history of prior multiple suicide attempts Patient has been extubated on 11/16, acute encephalopathy has resolved. Psychiatry was consulted. Patient was evaluated by Dr. Sheryle SprayJonalagada, initially it was felt that she needs disposition to acute psychiatry facility.  Patient was closely followed by psychiatry. With family support, eventually patient was cleared to be discharged home by psychiatry for [facility].  Acute respiratory failure with hypoxia: Due to drug overdose, resolved, extubated on 11/16  Mild sinus bradycardia likely related to overdose Resolved, QTC improved to 447, beta blocker on hold secondary to bradycardia  Leukocytosis: Likely stressed demargination, resolved,  no fevers or any infectious source  Acute kidney injury Resolved  Seizures Patient was seen closely by neurology, EEG was done which showed area of potential epileptogenic density in the right parieto-occipital region, no seizure recorded on the study. Neurology recommended to continue home dose of Lyrica, Lamictal, Zonegran. Patient was started on Keppra 750 mg twice a day for 2  weeks then discontinue per neurology recommendations.  Underweight/anorexia: BMI 17.2: Encouraged strongly for healthy diet added nutritional supplements     Day of Discharge BP 145/85 mmHg  Pulse 82  Temp(Src) 98.4 F (36.9 C) (Oral)  Resp 16  Ht 5\' 4"  (1.626 m)  Wt 43.772 kg (96 lb 8 oz)  BMI 16.56 kg/m2  SpO2 99%  Physical Exam: General: Alert and awake oriented x3 not in any acute distress. CVS: S1-S2 clear no murmur rubs or gallops Chest: clear to auscultation bilaterally, no wheezing rales or rhonchi Abdomen: soft nontender, nondistended, normal bowel sounds Extremities: no cyanosis, clubbing or edema noted bilaterally Neuro: Cranial nerves II-XII intact, no focal neurological deficits   The results of significant diagnostics from this hospitalization (including imaging, microbiology, ancillary and laboratory) are listed below for reference.    LAB RESULTS: Basic Metabolic Panel:  Recent Labs Lab 10/19/14 0320  10/20/14 0229  10/21/14 0235 10/22/14 0530  NA 142  < > 141  < > 137 140  K 4.3  < > 4.9  < > 3.7 4.0  CL 110  < > 110  < > 108 112  CO2 19  < > 14*  < > 16* 18*  GLUCOSE 83  < > 98  < > 113* 92  BUN 18  < > 19  < > 15 13  CREATININE 1.13*  < > 0.95  < > 0.90 0.89  CALCIUM 8.1*  < > 9.2  < > 9.0 8.9  MG 1.7  < > 2.1  --   --   --   PHOS 2.7  --   --   --   --   --   < > = values in this interval not displayed. Liver Function Tests:  Recent Labs Lab 10/18/14 1627  AST 20  ALT 12  ALKPHOS 67  BILITOT 0.3  PROT 6.3  ALBUMIN 3.0*   No results for input(s): LIPASE, AMYLASE in the last 168 hours.  Recent Labs Lab 10/20/14 1818  AMMONIA 28   CBC:  Recent Labs Lab 10/21/14 0235 10/22/14 0530  WBC 14.9* 9.7  NEUTROABS 13.1* 7.7  HGB 11.4* 11.4*  HCT 34.1* 35.0*  MCV 95.8 96.7  PLT 194 232   Cardiac Enzymes:  Recent Labs Lab 10/18/14 2038  CKTOTAL 274*   BNP: Invalid input(s): POCBNP CBG:  Recent Labs Lab 10/18/14 2004  10/19/14 1351  GLUCAP 101* 122*    Significant Diagnostic Studies:  Ct Head Wo Contrast  10/19/2014   CLINICAL DATA:  Acute respiratory failure with hypoxia J96.01 (ICD-10-CM) Acute encephalopathy G93.40 (ICD-10-CM)  EXAM: CT HEAD WITHOUT CONTRAST  TECHNIQUE: Contiguous axial images were obtained from the base of the skull through the vertex without intravenous contrast.  COMPARISON:  1 day prior  FINDINGS: Sinuses/Soft tissues: Fluid in the sphenoid sinus. Clear mastoid air cells.  Intracranial: No mass lesion, hemorrhage, hydrocephalus, acute infarct, intra-axial, or extra-axial fluid collection.  IMPRESSION: Normal head CT.   Electronically Signed   By: Jeronimo Greaves M.D.   On: 10/19/2014 16:20   Ct Head Wo Contrast  10/18/2014   CLINICAL  DATA:  Post CPR, history of seizures, altered mental status  EXAM: CT HEAD WITHOUT CONTRAST  CT CERVICAL SPINE WITHOUT CONTRAST  TECHNIQUE: Multidetector CT imaging of the head and cervical spine was performed following the standard protocol without intravenous contrast. Multiplanar CT image reconstructions of the cervical spine were also generated.  COMPARISON:  None.  FINDINGS: CT HEAD FINDINGS  No skull fracture is noted. Mastoid air cells are unremarkable. There is mucosal thickening with partial opacification bilateral ethmoid air cells. Mucosal thickening with air-fluid level noted bilateral sphenoid sinus. Abundant secretions are noted in nasopharynx.  No intracranial hemorrhage, mass effect or midline shift. The gray and white-matter differentiation is preserved. No acute cortical infarction. No mass lesion is noted on this unenhanced scan.  CT CERVICAL SPINE FINDINGS  Axial images shows no acute fracture or subluxation. Computer processed images shows cervical spine shows alignment, disc spaces and vertebral body heights to be preserved. No acute fracture or subluxation. Endotracheal and NG tube in place is noted.  There is no pneumothorax in visualized lung  apices. Bilateral apical Mild pleural parenchymal scarring.  IMPRESSION: 1. No acute intracranial abnormality. 2. There is mucosal thickening with partial opacification bilateral ethmoid air cells. Mucosal thickening with air-fluid level bilateral sphenoid sinus. Abundant secretions are noted in nasopharynx. 3. No cervical spine acute fracture or subluxation. NG tube and endotracheal tube partially visualized.   Electronically Signed   By: Natasha Mead M.D.   On: 10/18/2014 17:17   Ct Cervical Spine Wo Contrast  10/18/2014   CLINICAL DATA:  Post CPR, history of seizures, altered mental status  EXAM: CT HEAD WITHOUT CONTRAST  CT CERVICAL SPINE WITHOUT CONTRAST  TECHNIQUE: Multidetector CT imaging of the head and cervical spine was performed following the standard protocol without intravenous contrast. Multiplanar CT image reconstructions of the cervical spine were also generated.  COMPARISON:  None.  FINDINGS: CT HEAD FINDINGS  No skull fracture is noted. Mastoid air cells are unremarkable. There is mucosal thickening with partial opacification bilateral ethmoid air cells. Mucosal thickening with air-fluid level noted bilateral sphenoid sinus. Abundant secretions are noted in nasopharynx.  No intracranial hemorrhage, mass effect or midline shift. The gray and white-matter differentiation is preserved. No acute cortical infarction. No mass lesion is noted on this unenhanced scan.  CT CERVICAL SPINE FINDINGS  Axial images shows no acute fracture or subluxation. Computer processed images shows cervical spine shows alignment, disc spaces and vertebral body heights to be preserved. No acute fracture or subluxation. Endotracheal and NG tube in place is noted.  There is no pneumothorax in visualized lung apices. Bilateral apical Mild pleural parenchymal scarring.  IMPRESSION: 1. No acute intracranial abnormality. 2. There is mucosal thickening with partial opacification bilateral ethmoid air cells. Mucosal thickening with  air-fluid level bilateral sphenoid sinus. Abundant secretions are noted in nasopharynx. 3. No cervical spine acute fracture or subluxation. NG tube and endotracheal tube partially visualized.   Electronically Signed   By: Natasha Mead M.D.   On: 10/18/2014 17:17   Dg Chest Port 1 View  10/18/2014   CLINICAL DATA:  intubation, post cardiopulmonary resuscitation  EXAM: PORTABLE CHEST - 1 VIEW  COMPARISON:  01/04/2007  FINDINGS: Cardiomediastinal silhouette is stable. Mild hyperinflation. Endotracheal tube in place with tip 2 cm above the carina. NG tube in place with tip in mid stomach. No acute infiltrate or pulmonary edema. No pneumothorax.  IMPRESSION: No active disease.  Endotracheal tube in place.  No pneumothorax.   Electronically Signed  By: Natasha MeadLiviu  Pop M.D.   On: 10/18/2014 17:12   Dg Chest Port 1v Same Day  10/19/2014   CLINICAL DATA:  Fever  EXAM: PORTABLE CHEST - 1 VIEW SAME DAY  COMPARISON:  10/18/2014  FINDINGS: Cardiomediastinal silhouette is stable. Mild hyperinflation. No acute infiltrate or pulmonary edema. Endotracheal and NG tube has been removed. No pneumothorax.  IMPRESSION: No active disease. Endotracheal and NG tube has been removed. No pneumothorax.   Electronically Signed   By: Natasha MeadLiviu  Pop M.D.   On: 10/19/2014 13:46     Disposition and Follow-up: Discharge Instructions    Diet - low sodium heart healthy    Complete by:  As directed      Increase activity slowly    Complete by:  As directed             DISPOSITION home  DIET: Heart healthy diet    DISCHARGE FOLLOW-UP Follow-up Information    Follow up with FRY,STEPHEN A, MD. Schedule an appointment as soon as possible for a visit in 2 weeks.   Specialty:  Family Medicine   Why:  for hospital follow-up   Contact information:   [address] [place] [address] [phone]       Please follow up.   Contact information:   [facility] Call for appointment intensive outpatient   Monday  at 12 assessment with Charmian MuffAnn Evans        Follow up On 10/27/2014.   Why:  12:00 PM with Charmian MuffAnn Evans   Contact information:    Health Call for appointment intensive outpatient   Monday at 12Noon assessment with Charmian MuffAnn Evans   [phone]       Time spent on Discharge: 35 mins  Signed:   RAI,RIPUDEEP M.D. [facility] 10/24/2014, 4:05 PM Pager: [phone]

## 2014-10-24 NOTE — Consult Note (Signed)
[facility] Face-to-Face Psychiatry Consult   Reason for Consult:  Depression and suicidal attempt Referring Physician:  Dr. Deberah [name] is an 48 y.o. female. Total Time spent with patient: 45 minutes  Assessment: AXIS I:  Generalized Anxiety Disorder and Major Depression, Recurrent severe AXIS II:  Deferred AXIS III:   Past Medical History  Diagnosis Date  . Epilepsy     seizures, sees Dr. Jannifer Franklin  . Anxiety   . Depression   . Insomnia   . Headache(784.0)     migraine  . Hypertension   . Suicidal ideation   . Obsessive compulsive disorder   . Arthritis   . Gallstones   . Hemorrhoids   . Chronic constipation 01/10/2014   AXIS IV:  other psychosocial or environmental problems, problems related to social environment and problems with primary support group AXIS V:  41-50 serious symptoms  Plan: Case discussed with psych at the social service No evidence of imminent risk to self or others at present.   Patient does not meet criteria for psychiatric inpatient admission. Supportive therapy provided about ongoing stressors. Refer to IOP. Discussed crisis plan, support from social network, calling 911, coming to the Emergency Department, and calling Suicide Hotline.  Refer to psych social service for psychiatric referrals for both intensive outpatient services and outpatient services Appreciate psychiatric consultation and follow up as clinically required Please contact [phone] or [phone] if needs further assistance  Subjective:   [name] is a 48 y.o. female patient admitted with Depression and suicidal attempt.  HPI:  This is a 48 year old female seen for psych consultation and evaluation of depression and suicidal ideations. Patient stated that she has no current out patient psychiatric services and has been suffering with significant stresses at work, quit two weeks ago, felt overwhelmed and overdose on medication with intention to end her life. She has  stated that going to [place] made it feel more heat from the colleagues which she can not take it. She stated that since than she is spiraling down emotionally, depressed, sad, anxious, disturbed sleep and appetite and becomes suicidal. She has chronic and recurrent depression and multiple previous suicide attempt required psych admission. She has no history of abuse or victimization. Patient mother who is at bed side stated they do not believe in acute psych admission due to previously failed to respond. Patient states that she wish to go home without treatment. Reportedly she failed all antidepressant medications and her last psychiatric visit told her she may need to be referred to ECT treatment.   Interval history: Patient seen for psychiatric consultation follow-up and case discussed with the psychiatric social service. Patient has been calm, quiet and cooperative during this evaluation. Patient has several family members in her room including mom, dad and husband. Patient denies current symptoms of depression, anxiety and, psychosis, suicide, homicide ideation, intention or plans. Patient contract for safety and willing to participate outpatient psychiatric services as needed for. Patient has previously received outpatient counseling and has contact with the counselors. Patient mother, father and husband seems to be very supportive and available to care for her immediately as needed.  Medical History: Patient with a past medical history significant for depression who has had multiple suicide attempts in the past who was [facility] from the [facility] department on 10/18/2014 after a presumed drug overdose.the patient's husband states that she does not abuse alcohol or illicit drugs and she only takes medications which are  prescribed to her. She has "chronic depression" but of some of late she has not been feeling worse than normal. She was last seen normal on Friday evening. Her  husband saw her sleeping on 10/18/2014 at around 7 AM. However she was next found on 10/18/2014 at 3:30 PM in the bathroom by her son. She was breathing but was unresponsive. 911 was called and the fire department did not feel a pulse and so they initiated CPR for a very brief period of time. Shortly thereafter EMS arrived and found her to have a pulse. They administered glucose, calcium, and bicarbonate. She was transcutaneously paced briefly en route to the emergency department. In the emergency room she was found to have a normal blood pressure and a spontaneous rhythm which was narrow complex on telemetry. Transcutaneous pacing was held. She had been intubated in route to the emergency department by EMS after a vomiting episode. In the emergency department she had a CT head. The family reports that she takes Ambien as well as Xanax at home. Her husband states that he typically tries to hide these pills from her but apparently she found them today. He estimates that she may have taken as many as 30 Ambien and 13 0.5 mg tablets of Xanax.  PAST MEDICAL HISTORY :   has a past medical history of Epilepsy; Anxiety; Depression; Insomnia; Headache(784.0); Hypertension; Suicidal ideation; Obsessive compulsive disorder; Arthritis; Gallstones; Hemorrhoids; and Chronic constipation (01/10/2014). She has past surgical history that includes Abdominal hysterectomy (2001); Cholecystectomy; Breast lumpectomy (Left, 01/2007); Tonsillectomy; Cesarean section; Appendectomy (Right); and Hemorrhoid banding (2015).  HPI Elements:   Location:  depression. Quality:  poor'. Severity:  status post suicidal attempt. 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  Past Psychiatric History: Past Medical History  Diagnosis Date  . Epilepsy     seizures, sees Dr. Jannifer Franklin  . Anxiety   . Depression   . Insomnia   . Headache(784.0)     migraine  . Hypertension   . Suicidal ideation   . Obsessive compulsive disorder   . Arthritis    . Gallstones   . Hemorrhoids   . Chronic constipation 01/10/2014    reports that she has never smoked. She has never used smokeless tobacco. She reports that she drinks alcohol. She reports that she does not use illicit drugs. Family History  Problem Relation Age of Onset  . Cirrhosis Mother   . Heart disease Father   . Hypertension Father   . Thyroid cancer Brother   . HIV Brother   . Pancreatic cancer Maternal Uncle   . Stomach cancer Mother   . Heart disease Mother      Living Arrangements: Spouse/significant other   Abuse/Neglect [facility]) Physical Abuse: Denies Verbal Abuse: Denies Sexual Abuse: Denies Allergies:   Allergies  Allergen Reactions  . Vimpat [Lacosamide]     Increased depression    ACT Assessment Complete:  No Objective: Blood pressure 142/90, pulse 89, temperature 98.6 F (37 C), temperature source Oral, resp. rate 16, height '5\' 4"'  (1.626 m), weight 43.772 kg (96 lb 8 oz), SpO2 98 %.Body mass index is 16.56 kg/(m^2). Results for orders placed or performed during the hospital encounter of 10/18/14 (from the past 72 hour(s))  Lamotrigine level     Status: Abnormal   Collection Time: 10/21/14  8:15 PM  Result Value Ref Range   Lamotrigine Lvl 1.2 (L) 4.0 - 18.0 mcg/mL    Comment: Performed at [facility] in AM  Status: Abnormal   Collection Time: 10/22/14  5:30 AM  Result Value Ref Range   Sodium 140 137 - 147 mEq/L   Potassium 4.0 3.7 - 5.3 mEq/L   Chloride 112 96 - 112 mEq/L   CO2 18 (L) 19 - 32 mEq/L   Glucose, Bld 92 70 - 99 mg/dL   BUN 13 6 - 23 mg/dL   Creatinine, Ser 0.89 0.50 - 1.10 mg/dL   Calcium 8.9 8.4 - 10.5 mg/dL   GFR calc non Af Amer 76 (L) >90 mL/min   GFR calc Af Amer 88 (L) >90 mL/min    Comment: (NOTE) The eGFR has been calculated using the CKD EPI equation. This calculation has not been validated in all clinical situations. eGFR's persistently <90 mL/min signify possible Chronic Kidney Disease.    Anion gap  10 5 - 15  CBC with Differential     Status: Abnormal   Collection Time: 10/22/14  5:30 AM  Result Value Ref Range   WBC 9.7 4.0 - 10.5 K/uL   RBC 3.62 (L) 3.87 - 5.11 MIL/uL   Hemoglobin 11.4 (L) 12.0 - 15.0 g/dL   HCT 35.0 (L) 36.0 - 46.0 %   MCV 96.7 78.0 - 100.0 fL   MCH 31.5 26.0 - 34.0 pg   MCHC 32.6 30.0 - 36.0 g/dL   RDW 13.0 11.5 - 15.5 %   Platelets 232 150 - 400 K/uL   Neutrophils Relative % 80 (H) 43 - 77 %   Neutro Abs 7.7 1.7 - 7.7 K/uL   Lymphocytes Relative 11 (L) 12 - 46 %   Lymphs Abs 1.1 0.7 - 4.0 K/uL   Monocytes Relative 7 3 - 12 %   Monocytes Absolute 0.7 0.1 - 1.0 K/uL   Eosinophils Relative 2 0 - 5 %   Eosinophils Absolute 0.2 0.0 - 0.7 K/uL   Basophils Relative 0 0 - 1 %   Basophils Absolute 0.0 0.0 - 0.1 K/uL   Labs are reviewed and are pertinent for low TSH.  Current Facility-Administered Medications  Medication Dose Route Frequency Provider Last Rate Last Dose  . 0.9 %  sodium chloride infusion  250 mL Intravenous PRN Juanito Doom, MD   Stopped at 10/20/14 0800  . acetaminophen (TYLENOL) tablet 650 mg  650 mg Oral Q4H PRN Juanito Doom, MD   650 mg at 10/24/14 0428  . enoxaparin (LOVENOX) injection 30 mg  30 mg Subcutaneous Q24H Charmian Muff Riggins, RPH   30 mg at 10/22/14 1851  . feeding supplement (ENSURE COMPLETE) (ENSURE COMPLETE) liquid 237 mL  237 mL Oral TID BM Dalene Carrow, RD   237 mL at 10/24/14 1002  . lactose free nutrition (BOOST PLUS) liquid 237 mL  237 mL Oral TID WC Ripudeep K Rai, MD   237 mL at 10/24/14 0800  . lamoTRIgine (LAMICTAL) tablet 50 mg  50 mg Oral Daily Tammy S Parrett, NP   50 mg at 10/24/14 1003  . levETIRAcetam (KEPPRA) tablet 750 mg  750 mg Oral Q12H Maud Deed McCallister, RPH   750 mg at 10/24/14 1003  . LORazepam (ATIVAN) injection 1-2 mg  1-2 mg Intravenous Q2H PRN Juanito Doom, MD   1 mg at 10/19/14 2217  . ondansetron (ZOFRAN) injection 4 mg  4 mg Intravenous Q6H PRN Juanito Doom, MD    4 mg at 10/19/14 1622  . pregabalin (LYRICA) capsule 75 mg  75 mg Oral BID Marliss Coots, PA-C  75 mg at 10/24/14 1001  . zonisamide (ZONEGRAN) capsule 300 mg  300 mg Oral BID Roland Rack, MD   300 mg at 10/24/14 1003    Psychiatric Specialty Exam: Physical Exam as per history and physical  ROS depression, anxiety and insomnia  Blood pressure 142/90, pulse 89, temperature 98.6 F (37 C), temperature source Oral, resp. rate 16, height '5\' 4"'  (1.626 m), weight 43.772 kg (96 lb 8 oz), SpO2 98 %.Body mass index is 16.56 kg/(m^2).  General Appearance: Guarded  Eye Contact::  Good  Speech:  Clear and Coherent and Slow  Volume:  Decreased  Mood:  Anxious and Depressed  Affect:  Appropriate and Congruent  Thought Process:  Coherent and Goal Directed  Orientation:  Full (Time, Place, and Person)  Thought Content:  Rumination  Suicidal Thoughts:  Yes.  with intent/plan  Homicidal Thoughts:  No  Memory:  Immediate;   Fair Recent;   Fair  Judgement:  Impaired  Insight:  Lacking  Psychomotor Activity:  Decreased  Concentration:  Fair  Recall:  [employer] of Knowledge:Fair  Language: Good  Akathisia:  NA  Handed:  Right  AIMS (if indicated):     Assets:  Communication Skills Desire for Improvement Financial Resources/Insurance Housing Intimacy Leisure Time Resilience Social Support Talents/Skills Transportation  Sleep:      Musculoskeletal: Strength & Muscle Tone: decreased Gait & Station: normal Patient leans: N/A  Treatment Plan Summary: Daily contact with patient to assess and evaluate symptoms and progress in treatment Medication management Recommend outpatient psychiatric counseling and medication management Case discussed with psych social service for appropriate psych referrals  Natally Ribera,JANARDHAHA R. 10/24/2014 11:07 AM

## 2014-10-24 NOTE — Progress Notes (Signed)
Patient ID: Philipp OvensMelinda S XXXLechner  female  ZOX:[id number]    DOB: Mar 19, 1966    DOA: 10/18/2014  PCP: Nelwyn SalisburyFRY,STEPHEN A, MD  Brief history of present illness Patient is a 48 year old female with history of anxiety, depression, seizure disorder, hypertension was admitted on 10/18/14 from ED after a drug overdose with the Ambien and benzodiazepines requiring intubation and mechanical ventilation. CT of the head showed no acute intracranial process.  Patient was extubated on 11/16. Neurology was consulted due to seizure disorder. Patient was transferred to the floor on 11/17  Assessment/Plan: Principal Problem:   Acute encephalopathy secondary to overdose on benzodiazepine, Ambien:due to suicide attempt, patient has prior multiple suicide attempts-  Acute encephalopathy has resolved, patient was extubated on 11/16 Patient was seen by psychiatry, recommended disposition to acute psych facility Currently medically clear, patient's family (mother, father, husband) and patient requesting if possibly can be discharged home, her mother can come and stay with her as long as needed for 24/ 7 supervision. Requested psychiatry to reevaluate and advise.  Active Problems:   Acute respiratory failure with hypoxia- - resolved, extubated on 11/16  Mild sinus bradycardia likely related to overdose - Resolved, QTC improved to 447  Leukocytosis - likely stressed emargination, resolved  Acute renal failure - resolved  Seizures - neurology following closely, EEG was done which showed area off potential epileptogenic density in the right parieto-occipital region, no seizure recorded on the study - continue home Lyrica, Lamictal, Zonegran, Keppra  Underweight/anorexia: BMI 17.2 - Encouraged diet, added nutritional supplements  DVT Prophylaxis:Lovenox  Code Status:full code  Family Communication:discussed with  father, mother and patient's husband  at the bedside  Disposition: medically clear, DC  SUMMARY DONE YESTERDAY 11/19, STILL STANDS, NO CHANGES  Consultants:  Pulmonary critical care admitted the patient  Neurology  Psychiatry  Procedures:  Mechanical ventilation  EEG  Antibiotics:  none    Subjective: Patient seen and examined, no complaints at this time,family members at the bedside   Objective: Weight change: -0.181 kg (-6.4 oz)  Intake/Output Summary (Last 24 hours) at 10/24/14 1217 Last data filed at 10/24/14 0600  Gross per 24 hour  Intake    340 ml  Output      3 ml  Net    337 ml   Blood pressure 142/90, pulse 89, temperature 98.6 F (37 C), temperature source Oral, resp. rate 16, height 5\' 4"  (1.626 m), weight 43.772 kg (96 lb 8 oz), SpO2 98 %.  Physical Exam: General: Alert and awake, oriented x3, not in any acute distress, cachectic appearing. CVS: S1-S2 clear, no murmur rubs or gallops Chest: clear to auscultation bilaterally, no wheezing, rales or rhonchi Abdomen: soft nontender, nondistended, normal bowel sounds  Extremities: no cyanosis, clubbing or edema noted bilaterally   Lab Results: Basic Metabolic Panel:  Recent Labs Lab 10/19/14 0320  10/20/14 0229  10/21/14 0235 10/22/14 0530  NA 142  < > 141  < > 137 140  K 4.3  < > 4.9  < > 3.7 4.0  CL 110  < > 110  < > 108 112  CO2 19  < > 14*  < > 16* 18*  GLUCOSE 83  < > 98  < > 113* 92  BUN 18  < > 19  < > 15 13  CREATININE 1.13*  < > 0.95  < > 0.90 0.89  CALCIUM 8.1*  < > 9.2  < > 9.0 8.9  MG 1.7  < > 2.1  --   --   --  PHOS 2.7  --   --   --   --   --   < > = values in this interval not displayed. Liver Function Tests:  Recent Labs Lab 10/18/14 1627  AST 20  ALT 12  ALKPHOS 67  BILITOT 0.3  PROT 6.3  ALBUMIN 3.0*   No results for input(s): LIPASE, AMYLASE in the last 168 hours.  Recent Labs Lab 10/20/14 1818  AMMONIA 28   CBC:  Recent Labs Lab 10/21/14 0235 10/22/14 0530  WBC 14.9* 9.7  NEUTROABS 13.1* 7.7  HGB 11.4* 11.4*  HCT 34.1* 35.0*  MCV  95.8 96.7  PLT 194 232   Cardiac Enzymes:  Recent Labs Lab 10/18/14 2038  CKTOTAL 274*   BNP: Invalid input(s): POCBNP CBG:  Recent Labs Lab 10/18/14 1559 10/18/14 2004 10/19/14 1351  GLUCAP 195* 101* 122*     Micro Results: Recent Results (from the past 240 hour(s))  MRSA PCR Screening     Status: None   Collection Time: 10/18/14  8:05 PM  Result Value Ref Range Status   MRSA by PCR NEGATIVE NEGATIVE Final    Comment:        The GeneXpert MRSA Assay (FDA approved for NASAL specimens only), is one component of a comprehensive MRSA colonization surveillance program. It is not intended to diagnose MRSA infection nor to guide or monitor treatment for MRSA infections.     Studies/Results: Ct Head Wo Contrast  10/19/2014   CLINICAL DATA:  Acute respiratory failure with hypoxia J96.01 (ICD-10-CM) Acute encephalopathy G93.40 (ICD-10-CM)  EXAM: CT HEAD WITHOUT CONTRAST  TECHNIQUE: Contiguous axial images were obtained from the base of the skull through the vertex without intravenous contrast.  COMPARISON:  1 day prior  FINDINGS: Sinuses/Soft tissues: Fluid in the sphenoid sinus. Clear mastoid air cells.  Intracranial: No mass lesion, hemorrhage, hydrocephalus, acute infarct, intra-axial, or extra-axial fluid collection.  IMPRESSION: Normal head CT.   Electronically Signed   By: Jeronimo Greaves M.D.   On: 10/19/2014 16:20   Ct Head Wo Contrast  10/18/2014   CLINICAL DATA:  Post CPR, history of seizures, altered mental status  EXAM: CT HEAD WITHOUT CONTRAST  CT CERVICAL SPINE WITHOUT CONTRAST  TECHNIQUE: Multidetector CT imaging of the head and cervical spine was performed following the standard protocol without intravenous contrast. Multiplanar CT image reconstructions of the cervical spine were also generated.  COMPARISON:  None.  FINDINGS: CT HEAD FINDINGS  No skull fracture is noted. Mastoid air cells are unremarkable. There is mucosal thickening with partial opacification  bilateral ethmoid air cells. Mucosal thickening with air-fluid level noted bilateral sphenoid sinus. Abundant secretions are noted in nasopharynx.  No intracranial hemorrhage, mass effect or midline shift. The gray and white-matter differentiation is preserved. No acute cortical infarction. No mass lesion is noted on this unenhanced scan.  CT CERVICAL SPINE FINDINGS  Axial images shows no acute fracture or subluxation. Computer processed images shows cervical spine shows alignment, disc spaces and vertebral body heights to be preserved. No acute fracture or subluxation. Endotracheal and NG tube in place is noted.  There is no pneumothorax in visualized lung apices. Bilateral apical Mild pleural parenchymal scarring.  IMPRESSION: 1. No acute intracranial abnormality. 2. There is mucosal thickening with partial opacification bilateral ethmoid air cells. Mucosal thickening with air-fluid level bilateral sphenoid sinus. Abundant secretions are noted in nasopharynx. 3. No cervical spine acute fracture or subluxation. NG tube and endotracheal tube partially visualized.   Electronically Signed   By:  Natasha MeadLiviu  Pop M.D.   On: 10/18/2014 17:17   Ct Cervical Spine Wo Contrast  10/18/2014   CLINICAL DATA:  Post CPR, history of seizures, altered mental status  EXAM: CT HEAD WITHOUT CONTRAST  CT CERVICAL SPINE WITHOUT CONTRAST  TECHNIQUE: Multidetector CT imaging of the head and cervical spine was performed following the standard protocol without intravenous contrast. Multiplanar CT image reconstructions of the cervical spine were also generated.  COMPARISON:  None.  FINDINGS: CT HEAD FINDINGS  No skull fracture is noted. Mastoid air cells are unremarkable. There is mucosal thickening with partial opacification bilateral ethmoid air cells. Mucosal thickening with air-fluid level noted bilateral sphenoid sinus. Abundant secretions are noted in nasopharynx.  No intracranial hemorrhage, mass effect or midline shift. The gray and  white-matter differentiation is preserved. No acute cortical infarction. No mass lesion is noted on this unenhanced scan.  CT CERVICAL SPINE FINDINGS  Axial images shows no acute fracture or subluxation. Computer processed images shows cervical spine shows alignment, disc spaces and vertebral body heights to be preserved. No acute fracture or subluxation. Endotracheal and NG tube in place is noted.  There is no pneumothorax in visualized lung apices. Bilateral apical Mild pleural parenchymal scarring.  IMPRESSION: 1. No acute intracranial abnormality. 2. There is mucosal thickening with partial opacification bilateral ethmoid air cells. Mucosal thickening with air-fluid level bilateral sphenoid sinus. Abundant secretions are noted in nasopharynx. 3. No cervical spine acute fracture or subluxation. NG tube and endotracheal tube partially visualized.   Electronically Signed   By: Natasha MeadLiviu  Pop M.D.   On: 10/18/2014 17:17   Dg Chest Port 1 View  10/18/2014   CLINICAL DATA:  intubation, post cardiopulmonary resuscitation  EXAM: PORTABLE CHEST - 1 VIEW  COMPARISON:  01/04/2007  FINDINGS: Cardiomediastinal silhouette is stable. Mild hyperinflation. Endotracheal tube in place with tip 2 cm above the carina. NG tube in place with tip in mid stomach. No acute infiltrate or pulmonary edema. No pneumothorax.  IMPRESSION: No active disease.  Endotracheal tube in place.  No pneumothorax.   Electronically Signed   By: Natasha MeadLiviu  Pop M.D.   On: 10/18/2014 17:12   Dg Chest Port 1v Same Day  10/19/2014   CLINICAL DATA:  Fever  EXAM: PORTABLE CHEST - 1 VIEW SAME DAY  COMPARISON:  10/18/2014  FINDINGS: Cardiomediastinal silhouette is stable. Mild hyperinflation. No acute infiltrate or pulmonary edema. Endotracheal and NG tube has been removed. No pneumothorax.  IMPRESSION: No active disease. Endotracheal and NG tube has been removed. No pneumothorax.   Electronically Signed   By: Natasha MeadLiviu  Pop M.D.   On: 10/19/2014 13:46     Medications: Scheduled Meds: . enoxaparin (LOVENOX) injection  30 mg Subcutaneous Q24H  . feeding supplement (ENSURE COMPLETE)  237 mL Oral TID BM  . lactose free nutrition  237 mL Oral TID WC  . lamoTRIgine  50 mg Oral Daily  . levETIRAcetam  750 mg Oral Q12H  . pregabalin  75 mg Oral BID  . zonisamide  300 mg Oral BID      LOS: 6 days   Mylin Gignac M.D. [facility] 10/24/2014, 12:17 PM Pager: [phone]  If 7PM-7AM, please contact night-coverage [email or web address] Password [email or web address]

## 2014-10-25 LAB — LEVETIRACETAM LEVEL: Levetiracetam Lvl: 22 ug/mL

## 2014-10-25 LAB — LAMOTRIGINE LEVEL: LAMOTRIGINE LVL: 1.2 ug/mL — AB (ref 4.0–18.0)

## 2014-11-10 ENCOUNTER — Other Ambulatory Visit: Payer: Self-pay | Admitting: Neurology

## 2014-11-10 MED ORDER — HYDROCODONE-ACETAMINOPHEN 5-325 MG PO TABS: 1.0000 | ORAL_TABLET | Freq: Four times a day (QID) | ORAL | Status: DC | PRN

## 2014-11-10 NOTE — Telephone Encounter (Signed)
Request entered, forwarded to provider for review.   Patient has rescheduled appt, and will be seen in Feb.

## 2014-11-10 NOTE — Telephone Encounter (Signed)
Pt is calling stating she needs a written Rx for Vicoden/Hydrocodone.  Please call and advise.

## 2014-11-11 NOTE — Telephone Encounter (Signed)
I called the patient to let them know their Rx for Hydrocodone was ready for pickup. Patient was instructed to bring Photo ID. 

## 2014-11-26 ENCOUNTER — Telehealth: Payer: Self-pay

## 2014-11-26 NOTE — Telephone Encounter (Signed)
I called the pharmacy back.  Spoke with AGCO CorporationChristy.  She wanted to verify diagnosis for Lyrica.

## 2014-11-26 NOTE — Telephone Encounter (Signed)
Pharmacist calling from [facility] with questions about med refill. Return phone number [phone]

## 2014-12-22 ENCOUNTER — Telehealth: Payer: Self-pay

## 2014-12-22 ENCOUNTER — Other Ambulatory Visit: Payer: Self-pay | Admitting: Neurology

## 2014-12-22 MED ORDER — HYDROCODONE-ACETAMINOPHEN 5-325 MG PO TABS
1.0000 | ORAL_TABLET | Freq: Four times a day (QID) | ORAL | Status: DC | PRN
Start: 2014-12-22 — End: 2015-01-21

## 2014-12-22 NOTE — Telephone Encounter (Signed)
Request entered, forwarded to provider for approval.  Patient has appt scheduled next month.   

## 2014-12-22 NOTE — Telephone Encounter (Signed)
Patient is calling to get written Rx for Hydrocodon [id number].  Please call when ready.

## 2014-12-22 NOTE — Telephone Encounter (Signed)
Called patient and informed Rx ready for pick up at front desk. Patient verbalized understanding.  

## 2014-12-30 ENCOUNTER — Other Ambulatory Visit: Payer: Self-pay | Admitting: Family Medicine

## 2015-01-01 ENCOUNTER — Telehealth: Payer: Self-pay | Admitting: Family Medicine

## 2015-01-01 ENCOUNTER — Other Ambulatory Visit: Payer: Self-pay | Admitting: Family Medicine

## 2015-01-01 NOTE — Telephone Encounter (Signed)
Pt would like to know why her alprazolam was denied. Pt states she will have an appt with psychiatrist soon. Please call pt

## 2015-01-02 NOTE — Telephone Encounter (Signed)
We did not refill the Xanax for obvious reasons. She took an overdose of this medication which was almost fatal. Of course I am not going to supply her with more. She needs to get ALL psychiatric meds from her psychiatrist

## 2015-01-05 NOTE — Telephone Encounter (Signed)
I spoke with pt and went over below information. 

## 2015-01-12 ENCOUNTER — Other Ambulatory Visit: Payer: Self-pay | Admitting: Family Medicine

## 2015-01-12 NOTE — Telephone Encounter (Signed)
Refill for 90 days only. She needs to have a mammogram before any can be given after that

## 2015-01-21 ENCOUNTER — Ambulatory Visit (INDEPENDENT_AMBULATORY_CARE_PROVIDER_SITE_OTHER): Payer: BLUE CROSS/BLUE SHIELD | Admitting: Neurology

## 2015-01-21 ENCOUNTER — Encounter: Payer: Self-pay | Admitting: Neurology

## 2015-01-21 VITALS — BP 125/76 | HR 62 | Ht 61.0 in | Wt 105.4 lb

## 2015-01-21 DIAGNOSIS — R569 Unspecified convulsions: Secondary | ICD-10-CM

## 2015-01-21 DIAGNOSIS — G441 Vascular headache, not elsewhere classified: Secondary | ICD-10-CM

## 2015-01-21 MED ORDER — PREGABALIN 100 MG PO CAPS: 100.0000 mg | ORAL_CAPSULE | Freq: Two times a day (BID) | ORAL | Status: DC

## 2015-01-21 MED ORDER — HYDROCODONE-ACETAMINOPHEN 5-325 MG PO TABS: 1.0000 | ORAL_TABLET | Freq: Four times a day (QID) | ORAL | Status: DC | PRN

## 2015-01-21 NOTE — Progress Notes (Signed)
Reason for visit: Headache  [name] is an 49 y.o. female  History of present illness:  Ms. Madelyn Brunner is a 49 year old right-handed white female with a history of significant psychiatric disease, the patient indicates that she had a suicide attempt on 10/18/2014. The patient was in the hospital for least 4 days, she indicated that she was intubated during the hospital stay. She overdosed on Xanax and Ambien. She did not take her hydrocodone. She is now back home, under psychiatric care. The patient indicates that she had several seizures during the hospital station, none since. The patient continues to have her usual chronic daily headache. The patient has developed some low back pain over the last 1 week that does not radiate down the legs. The patient will have increased back pain as her hydrocodone wears off. She occasionally will have some pain going up in the shoulders as well. The patient denies any weakness of the extremities. She returns for an evaluation.  Past Medical History  Diagnosis Date  . Epilepsy     seizures, sees Dr. Anne Hahn  . Anxiety   . Depression   . Insomnia   . Headache(784.0)     migraine  . Hypertension   . Suicidal ideation   . Obsessive compulsive disorder   . Arthritis   . Gallstones   . Hemorrhoids   . Chronic constipation 01/10/2014    Past Surgical History  Procedure Laterality Date  . Abdominal hysterectomy  2001  . Cholecystectomy    . Breast lumpectomy Left 01/2007    Dr. Abbey Chatters  . Tonsillectomy    . Cesarean section    . Appendectomy Right     with ovarian cyst removal  . Hemorrhoid banding  2015    Family History  Problem Relation Age of Onset  . Cirrhosis Mother   . Heart disease Father   . Hypertension Father   . Thyroid cancer Brother   . HIV Brother   . Pancreatic cancer Maternal Uncle   . Stomach cancer Mother   . Heart disease Mother     Social history:  reports that she has never smoked. She has never used  smokeless tobacco. She reports that she drinks alcohol. She reports that she does not use illicit drugs.    Allergies  Allergen Reactions  . Vimpat [Lacosamide]     Increased depression    Medications:  Current Outpatient Prescriptions on File Prior to Visit  Medication Sig Dispense Refill  . PREMARIN 0.625 MG tablet TAKE 1 TABLET BY MOUTH EVERY DAY FOR 21 DAYS THEN DO NOT TAKE FOR 7 DAYS 30 tablet 2  . zonisamide (ZONEGRAN) 100 MG capsule Take 3 capsules (300 mg total) by mouth 2 (two) times daily. 180 capsule 5   No current facility-administered medications on file prior to visit.    ROS:  Out of a complete 14 system review of symptoms, the patient complains only of the following symptoms, and all other reviewed systems are negative.  Fatigue Ringing in the ears, runny nose Insomnia Back pain Memory loss, headache, seizures Depression, hallucinations, suicidal thoughts  Blood pressure 125/76, pulse 62, height  (1.549 m), weight 105 lb 6.4 oz (47.809 kg).  Physical Exam  General: The patient is alert and cooperative at the time of the examination.  Neuromuscular: Range of movement of the cervical and lumbosacral spine were full.  Skin: No significant peripheral edema is noted.   Neurologic Exam  Mental status: The patient is oriented  x 3.  Cranial nerves: Facial symmetry is present. Speech is normal, no aphasia or dysarthria is noted. Extraocular movements are full. Visual fields are full.  Motor: The patient has good strength in all 4 extremities.  Sensory examination: The patient reports symmetric soft touch sensation on the face, decreased sensation on the left arm and leg relative to the right.  Coordination: The patient has good finger-nose-finger and heel-to-shin bilaterally.  Gait and station: The patient has a normal gait. Tandem gait is normal. Romberg is negative. No drift is seen.  Reflexes: Deep tendon reflexes are  symmetric.   Assessment/Plan:  1. Depression, suicidal ideation   2. Seizures  3. Headache  The husband currently is managing the medication, the medications are under lock and key. The patient was given a refill for her hydrocodone, and the Lyrica was increased taking 100 mg twice daily secondary to the low back pain. The seizures are under good control. The patient will follow-up in about 6 months. If the back pain worsens or continues, the patient will contact our office. The patient is to stretch the neck and low back on a regular basis.  Marlan Palau. Keith Malikye Reppond MD 01/21/2015 8:24 PM  [facility] [address] [place], [place] [id number]  Phone [phone] Fax [phone]

## 2015-01-21 NOTE — Patient Instructions (Signed)

## 2015-02-20 ENCOUNTER — Other Ambulatory Visit: Payer: Self-pay | Admitting: Neurology

## 2015-02-20 MED ORDER — HYDROCODONE-ACETAMINOPHEN 5-325 MG PO TABS: 1.0000 | ORAL_TABLET | Freq: Four times a day (QID) | ORAL | Status: DC | PRN

## 2015-02-20 NOTE — Telephone Encounter (Signed)
Patient is calling to get a written Rx for HYDROcodone-acetaminophen (NORCO/VICODIN) 5-325 MG per tablet. Please call patient when ready for pickup. Thank you.

## 2015-02-20 NOTE — Telephone Encounter (Signed)
Request entered, forwarded to provider for approval.  

## 2015-02-23 ENCOUNTER — Telehealth: Payer: Self-pay

## 2015-02-23 NOTE — Telephone Encounter (Signed)
Called patient and informed Rx ready for pick up at front desk. Patient verbalized understanding.  

## 2015-03-02 ENCOUNTER — Other Ambulatory Visit: Payer: Self-pay | Admitting: Family Medicine

## 2015-03-09 ENCOUNTER — Ambulatory Visit (INDEPENDENT_AMBULATORY_CARE_PROVIDER_SITE_OTHER): Payer: BLUE CROSS/BLUE SHIELD | Admitting: Family Medicine

## 2015-03-09 ENCOUNTER — Encounter: Payer: Self-pay | Admitting: Family Medicine

## 2015-03-09 ENCOUNTER — Ambulatory Visit: Payer: Self-pay | Admitting: Family Medicine

## 2015-03-09 VITALS — BP 152/91 | HR 66 | Temp 98.4°F | Ht 61.0 in | Wt 106.0 lb

## 2015-03-09 DIAGNOSIS — I10 Essential (primary) hypertension: Secondary | ICD-10-CM

## 2015-03-09 DIAGNOSIS — G4700 Insomnia, unspecified: Secondary | ICD-10-CM | POA: Diagnosis not present

## 2015-03-09 DIAGNOSIS — R569 Unspecified convulsions: Secondary | ICD-10-CM

## 2015-03-09 MED ORDER — ZOLPIDEM TARTRATE 10 MG PO TABS: 10.0000 mg | ORAL_TABLET | Freq: Every day | ORAL | Status: DC

## 2015-03-09 NOTE — Progress Notes (Signed)
Pre visit review using our clinic review tool, if applicable. No additional management support is needed unless otherwise documented below in the visit note. 

## 2015-03-09 NOTE — Progress Notes (Signed)
   Subjective:    Patient ID: [name], female    DOB: [birth year], 49 y.o.   MRN: [id number]  HPI Here with her husband to ask about refills for her Zolpidem. Of course she was hospitalized last November for overdosing on this and Xanax, but she has done very well since then. She is seeing Dr. Caldwell SinkBraden for her depression and OCD, and she has been taking Latuda for the past 3 months. This has been very helpful and her moods are stable. She is off Xanax completely now and her anxiety is well controlled. She has decreased the Zolpidem to a single 10 mg tablet at night and she sleeps well with this. Her husband is managing all her medications and he keeps them in a secured lockbox. She also asks about stopping the Premarin. She started these several years ago for hot flashes and they have worked well. She has not had a hot flash in over a year.    Review of Systems  Constitutional: Negative.   Respiratory: Negative.   Cardiovascular: Negative.   Neurological: Negative.   Psychiatric/Behavioral: Negative.        Objective:   Physical Exam  Constitutional: She is oriented to person, place, and time. She appears well-developed and well-nourished.  Cardiovascular: Normal rate, regular rhythm, normal heart sounds and intact distal pulses.   Pulmonary/Chest: Effort normal and breath sounds normal.  Neurological: She is alert and oriented to person, place, and time.  Psychiatric: She has a normal mood and affect. Her behavior is normal. Judgment and thought content normal.          Assessment & Plan:  She is doing well on her current regimen. I refilled Zolpidem for another 6 months. We will wean off Premarin by taking one pill every other day for the next month, then stop.

## 2015-03-23 ENCOUNTER — Telehealth: Payer: Self-pay | Admitting: Neurology

## 2015-03-23 NOTE — Telephone Encounter (Signed)
Patient is calling for written for Rx hydrocodone 5-325 mg.  Please call when ready.

## 2015-03-23 NOTE — Telephone Encounter (Signed)
[facility] because Dr Anne HahnWillis is out of the office.

## 2015-03-24 ENCOUNTER — Telehealth: Payer: Self-pay

## 2015-03-24 MED ORDER — HYDROCODONE-ACETAMINOPHEN 5-325 MG PO TABS: 1.0000 | ORAL_TABLET | Freq: Four times a day (QID) | ORAL | Status: DC | PRN

## 2015-03-24 NOTE — Telephone Encounter (Signed)
I spoke to [name] and she is aware that her Hydrocodone Rx is ready to p/u at our front desk.

## 2015-03-25 ENCOUNTER — Other Ambulatory Visit: Payer: Self-pay

## 2015-03-25 ENCOUNTER — Telehealth: Payer: Self-pay | Admitting: Neurology

## 2015-03-25 MED ORDER — HYDROCODONE-ACETAMINOPHEN 5-325 MG PO TABS: 1.0000 | ORAL_TABLET | Freq: Four times a day (QID) | ORAL | Status: DC | PRN

## 2015-03-25 NOTE — Telephone Encounter (Signed)
WID only filled Rx for #8 (enough to last until Dr Anne HahnWillis returned to the office)

## 2015-03-25 NOTE — Telephone Encounter (Signed)
Patient stated she picked up Rx HYDROcodone-acetaminophen (NORCO/VICODIN) 5-325 MG per tablet today and questioning why she only received 8 tablets and normally she receive 72 tabs.  Please call and advise.

## 2015-03-25 NOTE — Telephone Encounter (Signed)
error 

## 2015-03-25 NOTE — Telephone Encounter (Signed)
WID only wrote Rx for enough to last until Dr Anne HahnWillis returned.  I called the patient back and explained.  She verbalized understanding.

## 2015-03-26 ENCOUNTER — Telehealth: Payer: Self-pay

## 2015-03-26 NOTE — Telephone Encounter (Signed)
Spoke to patient. Rx ready for pick up at the desk with photo id.

## 2015-04-05 ENCOUNTER — Other Ambulatory Visit: Payer: Self-pay | Admitting: Neurology

## 2015-04-07 ENCOUNTER — Telehealth: Payer: Self-pay | Admitting: Family Medicine

## 2015-04-07 NOTE — Telephone Encounter (Signed)
She already has refills for 5 more months

## 2015-04-07 NOTE — Telephone Encounter (Signed)
Pt said her Remus Lofflerambien rx is inactive per pharm. Pt needs new rx

## 2015-04-07 NOTE — Telephone Encounter (Signed)
I spoke with pharmacy and pt does have refills, I did give pt this information.

## 2015-04-07 NOTE — Telephone Encounter (Signed)
I spoke with pt and Dr. [place] SinkBraden had gave pt a script for Zolpidem XR 12.5 mg, pt decided not to try this.

## 2015-04-09 ENCOUNTER — Other Ambulatory Visit: Payer: Self-pay | Admitting: Family Medicine

## 2015-04-10 ENCOUNTER — Telehealth: Payer: Self-pay | Admitting: Family Medicine

## 2015-04-10 NOTE — Telephone Encounter (Signed)
I spoke with pharmacy and they will add refills, okay per Dr. Clent RidgesFry.

## 2015-04-10 NOTE — Telephone Encounter (Signed)
Pt called to say that [facility] told her the following rx is not active and they need a new one.    zolpidem (AMBIEN) 10 MG tablet   Ph[facility]acy [facility] [address]

## 2015-04-17 ENCOUNTER — Other Ambulatory Visit: Payer: Self-pay | Admitting: Family Medicine

## 2015-04-21 ENCOUNTER — Other Ambulatory Visit: Payer: Self-pay

## 2015-04-21 DIAGNOSIS — Z1231 Encounter for screening mammogram for malignant neoplasm of breast: Secondary | ICD-10-CM

## 2015-04-22 ENCOUNTER — Telehealth: Payer: Self-pay | Admitting: Neurology

## 2015-04-22 MED ORDER — HYDROCODONE-ACETAMINOPHEN 5-325 MG PO TABS: 1.0000 | ORAL_TABLET | Freq: Four times a day (QID) | ORAL | Status: DC | PRN

## 2015-04-22 NOTE — Telephone Encounter (Signed)
The patient is due another refill, I will write the prescription.

## 2015-04-22 NOTE — Telephone Encounter (Signed)
Pt called to get  written rx for HYDROcodone-acetaminophen (NORCO/VICODIN) 5-325 MG per tablet. Please call and advise. Juliette AlcideMelinda can be reached at [phone]

## 2015-04-23 ENCOUNTER — Telehealth: Payer: Self-pay

## 2015-04-23 NOTE — Telephone Encounter (Signed)
Rx ready for pick up. 

## 2015-04-24 ENCOUNTER — Ambulatory Visit
Admission: RE | Admit: 2015-04-24 | Discharge: 2015-04-24 | Disposition: A | Discharge status: Home / Self Care | Payer: BLUE CROSS/BLUE SHIELD | Source: Ambulatory Visit

## 2015-04-24 DIAGNOSIS — Z1231 Encounter for screening mammogram for malignant neoplasm of breast: Secondary | ICD-10-CM

## 2015-04-27 ENCOUNTER — Telehealth: Payer: Self-pay | Admitting: Neurology

## 2015-04-27 NOTE — Telephone Encounter (Signed)
Calling to follow up on her call from last week concerning her HYDROcodone-acetaminophen (NORCO/VICODIN) 5-325 MG per tablet prescription.

## 2015-04-28 NOTE — Telephone Encounter (Signed)
I called the patient. Her Rx is ready for pick up. She stated her husband had already picked it up.

## 2015-05-07 ENCOUNTER — Other Ambulatory Visit: Payer: Self-pay | Admitting: Family Medicine

## 2015-05-27 ENCOUNTER — Other Ambulatory Visit: Payer: Self-pay | Admitting: Neurology

## 2015-05-27 ENCOUNTER — Telehealth: Payer: Self-pay

## 2015-05-27 MED ORDER — HYDROCODONE-ACETAMINOPHEN 5-325 MG PO TABS: 1.0000 | ORAL_TABLET | Freq: Four times a day (QID) | ORAL | Status: DC | PRN

## 2015-05-27 NOTE — Telephone Encounter (Signed)
Patient called requesting refill for HYDROcodone-acetaminophen (NORCO/VICODIN) 5-325 MG per tablet . Patient advised RX will be ready within 24 hours unless otherwise informed by RN °

## 2015-05-27 NOTE — Telephone Encounter (Signed)
Request entered, forwarded to provider for review.  

## 2015-05-27 NOTE — Telephone Encounter (Signed)
Rx ready for pick up. 

## 2015-06-25 ENCOUNTER — Telehealth: Payer: Self-pay | Admitting: Neurology

## 2015-06-25 NOTE — Telephone Encounter (Signed)
Patient called and requested a refill on Rx. HYDROcodone-acetaminophen (NORCO/VICODIN) 5-325 MG per tablet. Informed the patient it would be ready within 24 hours unless she hears otherwise from her nurse.

## 2015-06-26 ENCOUNTER — Other Ambulatory Visit: Payer: Self-pay

## 2015-06-26 ENCOUNTER — Telehealth: Payer: Self-pay

## 2015-06-26 MED ORDER — HYDROCODONE-ACETAMINOPHEN 5-325 MG PO TABS: 1.0000 | ORAL_TABLET | Freq: Four times a day (QID) | ORAL | Status: DC | PRN

## 2015-06-26 NOTE — Telephone Encounter (Signed)
Rx has been approved by provider.

## 2015-06-26 NOTE — Telephone Encounter (Signed)
error 

## 2015-07-23 ENCOUNTER — Ambulatory Visit (INDEPENDENT_AMBULATORY_CARE_PROVIDER_SITE_OTHER): Payer: BLUE CROSS/BLUE SHIELD | Admitting: Neurology

## 2015-07-23 ENCOUNTER — Encounter: Payer: Self-pay | Admitting: Neurology

## 2015-07-23 VITALS — BP 143/87 | HR 69 | Ht 61.0 in | Wt 118.0 lb

## 2015-07-23 DIAGNOSIS — R569 Unspecified convulsions: Secondary | ICD-10-CM

## 2015-07-23 DIAGNOSIS — G441 Vascular headache, not elsewhere classified: Secondary | ICD-10-CM

## 2015-07-23 MED ORDER — HYDROCODONE-ACETAMINOPHEN 5-325 MG PO TABS: 1.0000 | ORAL_TABLET | Freq: Four times a day (QID) | ORAL | Status: DC | PRN

## 2015-07-23 MED ORDER — PREGABALIN 100 MG PO CAPS: 100.0000 mg | ORAL_CAPSULE | Freq: Two times a day (BID) | ORAL | Status: DC

## 2015-07-23 NOTE — Patient Instructions (Addendum)
   We will work on getting Botox approved for the headache. You may be following up soon for this purpose.  Migraine Headache A migraine headache is an intense, throbbing pain on one or both sides of your head. A migraine can last for 30 minutes to several hours. CAUSES  The exact cause of a migraine headache is not always known. However, a migraine may be caused when nerves in the brain become irritated and release chemicals that cause inflammation. This causes pain. Certain things may also trigger migraines, such as:  Alcohol.  Smoking.  Stress.  Menstruation.  Aged cheeses.  Foods or drinks that contain nitrates, glutamate, aspartame, or tyramine.  Lack of sleep.  Chocolate.  Caffeine.  Hunger.  Physical exertion.  Fatigue.  Medicines used to treat chest pain (nitroglycerine), birth control pills, estrogen, and some blood pressure medicines. SIGNS AND SYMPTOMS  Pain on one or both sides of your head.  Pulsating or throbbing pain.  Severe pain that prevents daily activities.  Pain that is aggravated by any physical activity.  Nausea, vomiting, or both.  Dizziness.  Pain with exposure to bright lights, loud noises, or activity.  General sensitivity to bright lights, loud noises, or smells. Before you get a migraine, you may get warning signs that a migraine is coming (aura). An aura may include:  Seeing flashing lights.  Seeing bright spots, halos, or zigzag lines.  Having tunnel vision or blurred vision.  Having feelings of numbness or tingling.  Having trouble talking.  Having muscle weakness. DIAGNOSIS  A migraine headache is often diagnosed based on:  Symptoms.  Physical exam.  A CT scan or MRI of your head. These imaging tests cannot diagnose migraines, but they can help rule out other causes of headaches. TREATMENT Medicines may be given for pain and nausea. Medicines can also be given to help prevent recurrent migraines.  HOME CARE  INSTRUCTIONS  Only take over-the-counter or prescription medicines for pain or discomfort as directed by your health care provider. The use of long-term narcotics is not recommended.  Lie down in a dark, quiet room when you have a migraine.  Keep a journal to find out what may trigger your migraine headaches. For example, write down:  What you eat and drink.  How much sleep you get.  Any change to your diet or medicines.  Limit alcohol consumption.  Quit smoking if you smoke.  Get 7-9 hours of sleep, or as recommended by your health care provider.  Limit stress.  Keep lights dim if bright lights bother you and make your migraines worse. SEEK IMMEDIATE MEDICAL CARE IF:   Your migraine becomes severe.  You have a fever.  You have a stiff neck.  You have vision loss.  You have muscular weakness or loss of muscle control.  You start losing your balance or have trouble walking.  You feel faint or pass out.  You have severe symptoms that are different from your first symptoms. MAKE SURE YOU:   Understand these instructions.  Will watch your condition.  Will get help right away if you are not doing well or get worse. Document Released: 11/21/2005 Document Revised: 04/07/2014 Document Reviewed: 07/29/2013 [facility] Patient Information 2015 [place], [place]. This information is not intended to replace advice given to you by your health care provider. Make sure you discuss any questions you have with your health care provider.

## 2015-07-23 NOTE — Progress Notes (Signed)
Reason for visit: Headache  [name] is an 49 y.o. female  History of present illness:  [name] is a or 49 year old right-handed white female with a history of depression, and suicidal ideation. She has intractable headaches, having a headache about every other day. The patient is on Zonegran for her headaches and for seizures. She reports no further seizure events, the last seizure occurred around November 2015. She does not operate a motor vehicle at this time. She comes to the office with her husband, her husband is managing her medications, and he is keeping her medications locked up. The husband confirms this on today's visit. The patient takes hydrocodone if needed for the headache. The patient denies any significant change in the headache frequency or severity. She is tolerating the medications well.  Past Medical History  Diagnosis Date  . Epilepsy     seizures, sees Dr. Anne Hahn  . Anxiety   . Insomnia   . Headache(784.0)     migraine  . Hypertension   . Suicidal ideation   . Obsessive compulsive disorder   . Arthritis   . Gallstones   . Hemorrhoids   . Chronic constipation 01/10/2014  . Depression     sees Dr. Geannie Risen     Past Surgical History  Procedure Laterality Date  . Abdominal hysterectomy  2001  . Cholecystectomy    . Breast lumpectomy Left 01/2007    Dr. Abbey Chatters  . Tonsillectomy    . Cesarean section    . Appendectomy Right     with ovarian cyst removal  . Hemorrhoid banding  2015    Family History  Problem Relation Age of Onset  . Cirrhosis Mother   . Heart disease Father   . Hypertension Father   . Thyroid cancer Brother   . HIV Brother   . Pancreatic cancer Maternal Uncle   . Stomach cancer Mother   . Heart disease Mother     Social history:  reports that she has never smoked. She has never used smokeless tobacco. She reports that she does not drink alcohol or use illicit drugs.    Allergies  Allergen Reactions  .  Vimpat [Lacosamide]     Increased depression    Medications:  Prior to Admission medications   Medication Sig Start Date End Date Taking? Authorizing Provider  HYDROcodone-acetaminophen (NORCO/VICODIN) 5-325 MG per tablet Take 1 tablet by mouth every 6 (six) hours as needed. 07/23/15  Yes York Spaniel, MD  lurasidone (LATUDA) 80 MG TABS tablet Take 80 mg by mouth at bedtime.   Yes Historical Provider, MD  metoprolol succinate (TOPROL-XL) 100 MG 24 hr tablet TAKE 1 TABLET BY MOUTH EVERY DAY WITH OR IMMEDIATELY FOLLOWING A MEAL 04/17/15  Yes Nelwyn Salisbury, MD  pregabalin (LYRICA) 100 MG capsule Take 1 capsule (100 mg total) by mouth 2 (two) times daily. 07/23/15  Yes York Spaniel, MD  PREMARIN 0.625 MG tablet TAKE 1 TABLET BY MOUTH EVERY DAY FOR 21 DAYS THEN DO NOT TAKE FOR 7 DAYS 05/07/15  Yes Nelwyn Salisbury, MD  zolpidem (AMBIEN) 10 MG tablet Take 1 tablet (10 mg total) by mouth daily. 03/09/15  Yes Nelwyn Salisbury, MD  zonisamide (ZONEGRAN) 100 MG capsule TAKE 3 CAPSULES BY MOUTH 2 TIMES A DAY 04/05/15  Yes York Spaniel, MD    ROS:  Out of a complete 14 system review of symptoms, the patient complains only of the following symptoms, and all other  reviewed systems are negative.  Headache  Blood pressure 143/87, pulse 69, height  (1.549 m), weight 118 lb (53.524 kg).  Physical Exam  General: The patient is alert and cooperative at the time of the examination.  Skin: No significant peripheral edema is noted.   Neurologic Exam  Mental status: The patient is alert and oriented x 3 at the time of the examination. The patient has apparent normal recent and remote memory, with an apparently normal attention span and concentration ability.   Cranial nerves: Facial symmetry is present. Speech is normal, no aphasia or dysarthria is noted. Extraocular movements are full. Visual fields are full.  Motor: The patient has good strength in all 4 extremities.  Sensory examination: Soft  touch sensation is symmetric on the face, arms, and legs.  Coordination: The patient has good finger-nose-finger and heel-to-shin bilaterally.  Gait and station: The patient has a normal gait. Tandem gait is normal. Romberg is negative. No drift is seen.  Reflexes: Deep tendon reflexes are symmetric.   Assessment/Plan:  1. Intractable migraine  2. History of seizures  3. Depression, suicidal ideation  The patient may benefit from Botox injections. She is having at least 15 days with headache a month. The patient has signed a consent form for the Botox. The patient will follow-up possibly within the next several weeks if this is approved. Otherwise, she will be seen in 6 months. She will contact me if any further issues arise. Prescriptions were given today for Lyrica and hydrocodone.  Marlan Palau MD 07/23/2015 8:20 PM  [facility] [address] [place], [place] [address]  Phone [phone] Fax [phone]

## 2015-08-12 ENCOUNTER — Telehealth: Payer: Self-pay | Admitting: Neurology

## 2015-08-12 NOTE — Telephone Encounter (Signed)
I called the patient and scheduled appointment for Botox on 9/23 at 8 AM.

## 2015-08-12 NOTE — Telephone Encounter (Signed)
Patient received approval and is ready to be scheduled for botox. Earney Navy, could you please call her and work her in sometime after 9/15? Thank you!!

## 2015-08-12 NOTE — Telephone Encounter (Signed)
Patient is calling back to ask if there will be a co-pay with Botox.

## 2015-08-13 NOTE — Telephone Encounter (Signed)
I called the patient. I explained that she only has to pay her usual co-pay in our office, but there may be a co-pay from her specialty pharmacy. She needs assistance with contacting her specialty pharmacy covering her Botox.

## 2015-08-17 ENCOUNTER — Other Ambulatory Visit: Payer: Self-pay | Admitting: Family Medicine

## 2015-08-17 NOTE — Telephone Encounter (Signed)
[name] I will edit the apt note and call patient with contact information! Thank you!

## 2015-08-24 NOTE — Telephone Encounter (Signed)
Spoke with patient and gave her the number to [facility].

## 2015-08-25 NOTE — Telephone Encounter (Signed)
Earney Navy could you work this patient in again? The last apt was canceled. We will need at least 2 weeks to get the medication in, it was ordered today.

## 2015-08-26 NOTE — Telephone Encounter (Signed)
I called the patient. Appointment scheduled 10/5 at 8 AM.

## 2015-08-27 ENCOUNTER — Telehealth: Payer: Self-pay | Admitting: Neurology

## 2015-08-27 NOTE — Telephone Encounter (Signed)
Onalee Hua with [facility] is following up regarding pre authorization for Botox for the patient. Thank you.

## 2015-08-28 ENCOUNTER — Ambulatory Visit: Payer: Self-pay | Admitting: Neurology

## 2015-08-31 ENCOUNTER — Other Ambulatory Visit: Payer: Self-pay | Admitting: Family Medicine

## 2015-08-31 ENCOUNTER — Telehealth: Payer: Self-pay | Admitting: Neurology

## 2015-08-31 NOTE — Telephone Encounter (Signed)
Pt needs refill on HYDROcodone-acetaminophen (NORCO/VICODIN) 5-325 MG per tablet. Thank you °

## 2015-09-01 ENCOUNTER — Other Ambulatory Visit: Payer: Self-pay | Admitting: Neurology

## 2015-09-01 ENCOUNTER — Telehealth: Payer: Self-pay

## 2015-09-01 MED ORDER — HYDROCODONE-ACETAMINOPHEN 5-325 MG PO TABS: 1.0000 | ORAL_TABLET | Freq: Four times a day (QID) | ORAL | Status: DC | PRN

## 2015-09-01 NOTE — Telephone Encounter (Signed)
A prescription was written for hydrocodone. 

## 2015-09-01 NOTE — Telephone Encounter (Signed)
Cal in #30 with 5 rf 

## 2015-09-01 NOTE — Telephone Encounter (Signed)
Rx ready for pick up. 

## 2015-09-02 ENCOUNTER — Telehealth: Payer: Self-pay | Admitting: Family Medicine

## 2015-09-02 NOTE — Telephone Encounter (Signed)
Duplicate note, see previous

## 2015-09-02 NOTE — Telephone Encounter (Signed)
Pt needs refill for zolpidem (AMBIEN) 10 MG tablet #[facility]

## 2015-09-09 ENCOUNTER — Ambulatory Visit (INDEPENDENT_AMBULATORY_CARE_PROVIDER_SITE_OTHER): Payer: BLUE CROSS/BLUE SHIELD | Admitting: Neurology

## 2015-09-09 ENCOUNTER — Encounter: Payer: Self-pay | Admitting: Neurology

## 2015-09-09 VITALS — BP 119/77 | HR 72

## 2015-09-09 DIAGNOSIS — G43019 Migraine without aura, intractable, without status migrainosus: Secondary | ICD-10-CM

## 2015-09-09 DIAGNOSIS — G43711 Chronic migraine without aura, intractable, with status migrainosus: Secondary | ICD-10-CM

## 2015-09-09 DIAGNOSIS — G43719 Chronic migraine without aura, intractable, without status migrainosus: Secondary | ICD-10-CM

## 2015-09-09 HISTORY — DX: Migraine without aura, intractable, without status migrainosus: G43.019

## 2015-09-09 NOTE — Procedures (Signed)
     BOTOX PROCEDURE NOTE FOR MIGRAINE HEADACHE   HISTORY: [name] is a 49 year old patient with a history of intractable migraine headache. She continues to have severe headaches approximately every other day, one third of the month she has incapacitating headaches where she is unable to function. She has photophobia, phonophobia, and significant nausea and vomiting with the headache. The patient more recently has noted some episodes of pain associated with 15-20 minutes of neck stiffness, this may occur once a week. The patient comes into the office today for Botox therapy.   Description of procedure:  The patient was placed in a sitting position. The standard protocol was used for Botox as follows, with 5 units of Botox injected at each site:   -Procerus muscle, midline injection  -Corrugator muscle, bilateral injection  -Frontalis muscle, bilateral injection, with 2 sites each side, medial injection was performed in the upper one third of the frontalis muscle, in the region vertical from the medial inferior edge of the superior orbital rim. The lateral injection was again in the upper one third of the forehead vertically above the lateral limbus of the cornea, 1.5 cm lateral to the medial injection site.  -Temporalis muscle injection, 4 sites, bilaterally. The first injection was 3 cm above the tragus of the ear, second injection site was 1.5 cm to 3 cm up from the first injection site in line with the tragus of the ear. The third injection site was 1.5-3 cm forward between the first 2 injection sites. The fourth injection site was 1.5 cm posterior to the second injection site.  -Occipitalis muscle injection, 3 sites, bilaterally. The first injection was done one half way between the occipital protuberance and the tip of the mastoid process behind the ear. The second injection site was done lateral and superior to the first, 1 fingerbreadth from the first injection. The third  injection site was 1 fingerbreadth superiorly and medially from the first injection site.  -Cervical paraspinal muscle injection, 2 sites, bilateral, the first injection site was 1 cm from the midline of the cervical spine, 3 cm inferior to the lower border of the occipital protuberance. The second injection site was 1.5 cm superiorly and laterally to the first injection site.  -Trapezius muscle injection was performed at 3 sites, bilaterally. The first injection site was in the upper trapezius muscle halfway between the inflection point of the neck, and the acromion. The second injection site was one half way between the acromion and the first injection site. The third injection was done between the first injection site and the inflection point of the neck.   A 200 unit bottle of Botox was used, 155 units were injected, the rest of the Botox was wasted. The patient tolerated the procedure well, there were no complications of the above procedure.  Botox NDC 1610-9604-54 Lot number [id number] Expiration date May 2019

## 2015-09-09 NOTE — Progress Notes (Signed)
Please refer to Botox procedure note. 

## 2015-09-29 ENCOUNTER — Telehealth: Payer: Self-pay

## 2015-09-29 ENCOUNTER — Other Ambulatory Visit: Payer: Self-pay | Admitting: Neurology

## 2015-09-29 MED ORDER — HYDROCODONE-ACETAMINOPHEN 5-325 MG PO TABS: 1.0000 | ORAL_TABLET | Freq: Four times a day (QID) | ORAL | Status: DC | PRN

## 2015-09-29 NOTE — Telephone Encounter (Signed)
Pt needs refill on HYDROcodone-acetaminophen (NORCO/VICODIN) 5-325 MG per tablet. Thank you °

## 2015-09-29 NOTE — Telephone Encounter (Signed)
Rx ready for pick up. 

## 2015-10-22 NOTE — Telephone Encounter (Signed)
Error

## 2015-11-02 ENCOUNTER — Telehealth: Payer: Self-pay

## 2015-11-02 ENCOUNTER — Other Ambulatory Visit: Payer: Self-pay | Admitting: Neurology

## 2015-11-02 MED ORDER — HYDROCODONE-ACETAMINOPHEN 5-325 MG PO TABS: 1.0000 | ORAL_TABLET | Freq: Four times a day (QID) | ORAL | Status: DC | PRN

## 2015-11-02 NOTE — Telephone Encounter (Signed)
Request entered, forwarded to provider for review.  

## 2015-11-02 NOTE — Telephone Encounter (Signed)
Rx ready for pick up. 

## 2015-11-02 NOTE — Telephone Encounter (Signed)
Patient is calling for a written Rx Hydrocodone-acetaminophen 5-325 mg.  Thanks!

## 2015-11-17 NOTE — Telephone Encounter (Signed)
Called the patient and asked if her insurance was changing at the beginning of the year. She stated that insurance was staying the same.

## 2015-12-01 ENCOUNTER — Other Ambulatory Visit: Payer: Self-pay | Admitting: Neurology

## 2015-12-01 ENCOUNTER — Other Ambulatory Visit: Payer: Self-pay | Admitting: *Deleted

## 2015-12-01 MED ORDER — HYDROCODONE-ACETAMINOPHEN 5-325 MG PO TABS: 1.0000 | ORAL_TABLET | Freq: Four times a day (QID) | ORAL | Status: DC | PRN

## 2015-12-01 NOTE — Telephone Encounter (Signed)
Pt needs refill on HYDROcodone-acetaminophen (NORCO/VICODIN) 5-325 MG tablet. Thank you °

## 2015-12-01 NOTE — Telephone Encounter (Signed)
Script for Hydrocodone at front desk for pt pick up.

## 2015-12-01 NOTE — Telephone Encounter (Signed)
Dr Willis is out of the office.  Request entered, forwarded to WID for review.    

## 2015-12-15 ENCOUNTER — Telehealth: Payer: Self-pay

## 2015-12-15 ENCOUNTER — Ambulatory Visit: Payer: Self-pay | Admitting: Neurology

## 2015-12-15 NOTE — Telephone Encounter (Signed)
Patient did not come to a Botox appointment today.

## 2015-12-16 ENCOUNTER — Encounter: Payer: Self-pay | Admitting: Neurology

## 2015-12-16 ENCOUNTER — Ambulatory Visit (INDEPENDENT_AMBULATORY_CARE_PROVIDER_SITE_OTHER): Payer: BLUE CROSS/BLUE SHIELD | Admitting: Neurology

## 2015-12-16 VITALS — BP 109/68 | HR 51

## 2015-12-16 DIAGNOSIS — G43719 Chronic migraine without aura, intractable, without status migrainosus: Secondary | ICD-10-CM | POA: Diagnosis not present

## 2015-12-16 DIAGNOSIS — G43019 Migraine without aura, intractable, without status migrainosus: Secondary | ICD-10-CM

## 2015-12-16 NOTE — Procedures (Signed)
     BOTOX PROCEDURE NOTE FOR MIGRAINE HEADACHE   HISTORY:  Shannon GoslingMelinda Dragos  Is a 50 year old right-handed white female with a history of intractable migraine headache. The patient has come back in for her second Botox injection. The patient tolerated the first injection well. She is not sure whether this helped her headaches as she had a seizure on 10/01/2015 and fractured her right foot. The patient has been on increased opiate medications for her fracture pain since that time, and her headaches have improved with this. The patient is having about 3 headaches on average a week, the headaches may last all day long, and she may have to incapacitating headaches twice a week. The patient believes that she missed a dose of her Zonegran before the seizure occurred. She is no longer operating a motor vehicle at this time. She returns for the Botox treatment today.   Description of procedure:  The patient was placed in a sitting position. The standard protocol was used for Botox as follows, with 5 units of Botox injected at each site:   -Procerus muscle, midline injection  -Corrugator muscle, bilateral injection  -Frontalis muscle, bilateral injection, with 2 sites each side, medial injection was performed in the upper one third of the frontalis muscle, in the region vertical from the medial inferior edge of the superior orbital rim. The lateral injection was again in the upper one third of the forehead vertically above the lateral limbus of the cornea, 1.5 cm lateral to the medial injection site.  -Temporalis muscle injection, 4 sites, bilaterally. The first injection was 3 cm above the tragus of the ear, second injection site was 1.5 cm to 3 cm up from the first injection site in line with the tragus of the ear. The third injection site was 1.5-3 cm forward between the first 2 injection sites. The fourth injection site was 1.5 cm posterior to the second injection site.  -Occipitalis muscle  injection, 3 sites, bilaterally. The first injection was done one half way between the occipital protuberance and the tip of the mastoid process behind the ear. The second injection site was done lateral and superior to the first, 1 fingerbreadth from the first injection. The third injection site was 1 fingerbreadth superiorly and medially from the first injection site.  -Cervical paraspinal muscle injection, 2 sites, bilateral, the first injection site was 1 cm from the midline of the cervical spine, 3 cm inferior to the lower border of the occipital protuberance. The second injection site was 1.5 cm superiorly and laterally to the first injection site.  -Trapezius muscle injection was performed at 3 sites, bilaterally. The first injection site was in the upper trapezius muscle halfway between the inflection point of the neck, and the acromion. The second injection site was one half way between the acromion and the first injection site. The third injection was done between the first injection site and the inflection point of the neck.   A 200 unit bottle of Botox was used, 155 units were injected, the rest of the Botox was wasted. The patient tolerated the procedure well, there were no complications of the above procedure.  Botox NDC [id number] Lot number [id number] Expiration date  August 2019

## 2015-12-16 NOTE — Progress Notes (Signed)
Please refer to Botox procedure note. 

## 2015-12-17 ENCOUNTER — Other Ambulatory Visit: Payer: Self-pay | Admitting: Family Medicine

## 2015-12-20 ENCOUNTER — Other Ambulatory Visit: Payer: Self-pay | Admitting: Neurology

## 2015-12-22 ENCOUNTER — Telehealth: Payer: Self-pay | Admitting: Neurology

## 2015-12-22 NOTE — Telephone Encounter (Signed)
If patients call she ask if the fee can be waive for weather.

## 2015-12-22 NOTE — Telephone Encounter (Signed)
Patient called and stated she received a letter about a "no show" for January 10th.  She stated she called and left a message that she would not be in because of the snow on January 9th.  Thanks!

## 2016-01-04 ENCOUNTER — Telehealth: Payer: Self-pay

## 2016-01-04 ENCOUNTER — Other Ambulatory Visit: Payer: Self-pay | Admitting: Neurology

## 2016-01-04 MED ORDER — HYDROCODONE-ACETAMINOPHEN 5-325 MG PO TABS: 1.0000 | ORAL_TABLET | Freq: Four times a day (QID) | ORAL | Status: DC | PRN

## 2016-01-04 NOTE — Telephone Encounter (Signed)
Rx ready for pick up. 

## 2016-01-04 NOTE — Telephone Encounter (Signed)
Pt called requesting refill for HYDROcodone-acetaminophen (NORCO/VICODIN) 5-325 MG tablet . Thank you

## 2016-01-26 ENCOUNTER — Ambulatory Visit (INDEPENDENT_AMBULATORY_CARE_PROVIDER_SITE_OTHER): Payer: BLUE CROSS/BLUE SHIELD | Admitting: Neurology

## 2016-01-26 ENCOUNTER — Encounter: Payer: Self-pay | Admitting: Neurology

## 2016-01-26 VITALS — BP 93/64 | HR 61 | Ht 61.0 in | Wt 123.0 lb

## 2016-01-26 DIAGNOSIS — M542 Cervicalgia: Secondary | ICD-10-CM | POA: Diagnosis not present

## 2016-01-26 DIAGNOSIS — F329 Major depressive disorder, single episode, unspecified: Secondary | ICD-10-CM

## 2016-01-26 DIAGNOSIS — F32A Depression, unspecified: Secondary | ICD-10-CM

## 2016-01-26 DIAGNOSIS — S161XXS Strain of muscle, fascia and tendon at neck level, sequela: Secondary | ICD-10-CM

## 2016-01-26 DIAGNOSIS — G43711 Chronic migraine without aura, intractable, with status migrainosus: Secondary | ICD-10-CM | POA: Diagnosis not present

## 2016-01-26 MED ORDER — PREGABALIN 100 MG PO CAPS: 100.0000 mg | ORAL_CAPSULE | Freq: Two times a day (BID) | ORAL | Status: DC

## 2016-01-26 NOTE — Progress Notes (Signed)
Reason for visit: Headache  [name] is an 50 y.o. female  History of present illness:  [name] is a 50 year old right-handed white female with a history of depression, migraine headache, and cervical strain. The patient has headaches that come up in the back of the head, including the shoulders. She has received Botox injections on 2 occasions, the last on January 11 of 2017. She believes that the treatments have helped her some, the headaches are somewhat less frequent occurring every other day, and are less severe when they do occur. The patient is on trazodone taking 300 mg at night, she is sleeping fairly well with this. The patient feels somewhat groggy in the morning after the evening dose of Latuda. She believes that her depression is under relatively good control currently. She comes to this office for an evaluation.  Past Medical History  Diagnosis Date  . Epilepsy (HCC)     seizures, sees Dr. Anne Hahn  . Anxiety   . Insomnia   . Headache(784.0)     migraine  . Hypertension   . Suicidal ideation   . Obsessive compulsive disorder   . Arthritis   . Gallstones   . Hemorrhoids   . Chronic constipation 01/10/2014  . Depression     sees Dr. Geannie Risen   . Headache, common migraine, intractable 09/09/2015    Past Surgical History  Procedure Laterality Date  . Abdominal hysterectomy  2001  . Cholecystectomy    . Breast lumpectomy Left 01/2007    Dr. Abbey Chatters  . Tonsillectomy    . Cesarean section    . Appendectomy Right     with ovarian cyst removal  . Hemorrhoid banding  2015    Family History  Problem Relation Age of Onset  . Cirrhosis Mother   . Heart disease Father   . Hypertension Father   . Thyroid cancer Brother   . HIV Brother   . Pancreatic cancer Maternal Uncle   . Stomach cancer Mother   . Heart disease Mother     Social history:  reports that she has never smoked. She has never used smokeless tobacco. She reports that she does not  drink alcohol or use illicit drugs.    Allergies  Allergen Reactions  . Vimpat [Lacosamide]     Increased depression    Medications:  Prior to Admission medications   Medication Sig Start Date End Date Taking? Authorizing Provider  HYDROcodone-acetaminophen (NORCO/VICODIN) 5-325 MG tablet Take 1 tablet by mouth every 6 (six) hours as needed. 01/04/16   York Spaniel, MD  lurasidone (LATUDA) 80 MG TABS tablet Take 80 mg by mouth at bedtime.    Historical Provider, MD  metoprolol succinate (TOPROL-XL) 100 MG 24 hr tablet TAKE 1 TABLET BY MOUTH EVERY DAY WITH OR IMMEDIATELY FOLLOWING A MEAL 12/17/15   Nelwyn Salisbury, MD  pregabalin (LYRICA) 100 MG capsule Take 1 capsule (100 mg total) by mouth 2 (two) times daily. 07/23/15   York Spaniel, MD  PREMARIN 0.625 MG tablet TAKE 1 TABLET BY MOUTH EVERY DAY FOR 21 DAYS THEN DO NOT TAKE FOR 7 DAYS 05/07/15   Nelwyn Salisbury, MD  zolpidem (AMBIEN) 10 MG tablet TAKE 1 TABLET EVERY DAY 09/02/15   Nelwyn Salisbury, MD  zonisamide (ZONEGRAN) 100 MG capsule TAKE 3 CAPSULES BY MOUTH 2 TIMES A DAY 12/21/15   York Spaniel, MD    ROS:  Out of a complete 14 system review of symptoms,  the patient complains only of the following symptoms, and all other reviewed systems are negative.  Blurred vision Constipation Insomnia Memory loss, dizziness, headache Depression, suicidal thoughts  Blood pressure 93/64, pulse 61, height  (1.549 m), weight 123 lb (55.792 kg).  Physical Exam  General: The patient is alert and cooperative at the time of the examination.  Skin: No significant peripheral edema is noted.   Neurologic Exam  Mental status: The patient is alert and oriented x 3 at the time of the examination. The patient has apparent normal recent and remote memory, with an apparently normal attention span and concentration ability.   Cranial nerves: Facial symmetry is present. Speech is normal, no aphasia or dysarthria is noted. Extraocular movements  are full. Visual fields are full. Affect is flat.  Motor: The patient has good strength in all 4 extremities.  Sensory examination: Soft touch sensation is symmetric on the face, arms, and legs.  Coordination: The patient has good finger-nose-finger and heel-to-shin bilaterally.  Gait and station: The patient has a normal gait. Tandem gait is normal. Romberg is negative. No drift is seen.  Reflexes: Deep tendon reflexes are symmetric.   Assessment/Plan:  1. Migraine headache  2. Cervicogenic headache  3. Cervical spondylosis  4. Depression  5. Insomnia  6. History of seizures, well controlled  The patient will continue her Zonegran, she has not had any further seizures. She is gaining some benefit with the Botox, this will be continued, we may add some injections for trigger points in the shoulders on the next treatment. She was given a prescription for her Lyrica today, she will follow-up in April for the Botox injection.  Marlan Palau MD 01/26/2016 8:16 PM  [facility] [address] [place], [place] [address]  Phone [phone] Fax [phone]

## 2016-01-26 NOTE — Patient Instructions (Signed)
Cervical Sprain  A cervical sprain is an injury in the neck in which the strong, fibrous tissues (ligaments) that connect your neck bones stretch or tear. Cervical sprains can range from mild to severe. Severe cervical sprains can cause the neck vertebrae to be unstable. This can lead to damage of the spinal cord and can result in serious nervous system problems. The amount of time it takes for a cervical sprain to get better depends on the cause and extent of the injury. Most cervical sprains heal in 1 to 3 weeks.  CAUSES   Severe cervical sprains may be caused by:    Contact sport injuries (such as from football, rugby, wrestling, hockey, auto racing, gymnastics, diving, martial arts, or boxing).    Motor vehicle collisions.    Whiplash injuries. This is an injury from a sudden forward and backward whipping movement of the head and neck.   Falls.   Mild cervical sprains may be caused by:    Being in an awkward position, such as while cradling a telephone between your ear and shoulder.    Sitting in a chair that does not offer proper support.    Working at a poorly designed computer station.    Looking up or down for long periods of time.   SYMPTOMS    Pain, soreness, stiffness, or a burning sensation in the front, back, or sides of the neck. This discomfort may develop immediately after the injury or slowly, 24 hours or more after the injury.    Pain or tenderness directly in the middle of the back of the neck.    Shoulder or upper back pain.    Limited ability to move the neck.    Headache.    Dizziness.    Weakness, numbness, or tingling in the hands or arms.    Muscle spasms.    Difficulty swallowing or chewing.    Tenderness and swelling of the neck.   DIAGNOSIS   Most of the time your health care provider can diagnose a cervical sprain by taking your history and doing a physical exam. Your health care provider will ask about previous neck injuries and any known neck  problems, such as arthritis in the neck. X-rays may be taken to find out if there are any other problems, such as with the bones of the neck. Other tests, such as a CT scan or MRI, may also be needed.   TREATMENT   Treatment depends on the severity of the cervical sprain. Mild sprains can be treated with rest, keeping the neck in place (immobilization), and pain medicines. Severe cervical sprains are immediately immobilized. Further treatment is done to help with pain, muscle spasms, and other symptoms and may include:   Medicines, such as pain relievers, numbing medicines, or muscle relaxants.    Physical therapy. This may involve stretching exercises, strengthening exercises, and posture training. Exercises and improved posture can help stabilize the neck, strengthen muscles, and help stop symptoms from returning.   HOME CARE INSTRUCTIONS    Put ice on the injured area.     Put ice in a plastic bag.     Place a towel between your skin and the bag.     Leave the ice on for 15-20 minutes, 3-4 times a day.    If your injury was severe, you may have been given a cervical collar to wear. A cervical collar is a two-piece collar designed to keep your neck from moving while it heals.      Do not remove the collar unless instructed by your health care provider.    If you have long hair, keep it outside of the collar.    Ask your health care provider before making any adjustments to your collar. Minor adjustments may be required over time to improve comfort and reduce pressure on your chin or on the back of your head.    Ifyou are allowed to remove the collar for cleaning or bathing, follow your health care provider's instructions on how to do so safely.    Keep your collar clean by wiping it with mild soap and water and drying it completely. If the collar you have been given includes removable pads, remove them every 1-2 days and hand wash them with soap and water. Allow them to air dry. They should be completely  dry before you wear them in the collar.    If you are allowed to remove the collar for cleaning and bathing, wash and dry the skin of your neck. Check your skin for irritation or sores. If you see any, tell your health care provider.    Do not drive while wearing the collar.    Only take over-the-counter or prescription medicines for pain, discomfort, or fever as directed by your health care provider.    Keep all follow-up appointments as directed by your health care provider.    Keep all physical therapy appointments as directed by your health care provider.    Make any needed adjustments to your workstation to promote good posture.    Avoid positions and activities that make your symptoms worse.    Warm up and stretch before being active to help prevent problems.   SEEK MEDICAL CARE IF:    Your pain is not controlled with medicine.    You are unable to decrease your pain medicine over time as planned.    Your activity level is not improving as expected.   SEEK IMMEDIATE MEDICAL CARE IF:    You develop any bleeding.   You develop stomach upset.   You have signs of an allergic reaction to your medicine.    Your symptoms get worse.    You develop new, unexplained symptoms.    You have numbness, tingling, weakness, or paralysis in any part of your body.   MAKE SURE YOU:    Understand these instructions.   Will watch your condition.   Will get help right away if you are not doing well or get worse.     This information is not intended to replace advice given to you by your health care provider. Make sure you discuss any questions you have with your health care provider.     Document Released: 09/18/2007 Document Revised: 11/26/2013 Document Reviewed: 05/29/2013  Elsevier Interactive Patient Education 2016 [employer]

## 2016-01-28 ENCOUNTER — Other Ambulatory Visit: Payer: Self-pay | Admitting: Neurology

## 2016-01-28 ENCOUNTER — Telehealth: Payer: Self-pay | Admitting: Neurology

## 2016-01-28 NOTE — Telephone Encounter (Signed)
Patient called to request refill of pregabalin (LYRICA) 100 MG capsule °

## 2016-01-28 NOTE — Telephone Encounter (Signed)
I called the patient. I advised Rx for Lyrica was faxed to [facility] on [address] 01/26/16. Confirmation was received. Patient is going to check with the pharmacy.

## 2016-02-03 ENCOUNTER — Telehealth: Payer: Self-pay | Admitting: Neurology

## 2016-02-03 ENCOUNTER — Telehealth: Payer: Self-pay | Admitting: *Deleted

## 2016-02-03 MED ORDER — HYDROCODONE-ACETAMINOPHEN 5-325 MG PO TABS: 1.0000 | ORAL_TABLET | Freq: Four times a day (QID) | ORAL | Status: DC | PRN

## 2016-02-03 NOTE — Telephone Encounter (Signed)
Rx ready at front desk for pick up

## 2016-02-03 NOTE — Telephone Encounter (Signed)
Hydrocodone has been refilled 

## 2016-02-03 NOTE — Telephone Encounter (Signed)
Pt called requesting refill for HYDROcodone-acetaminophen (NORCO/VICODIN) 5-325 MG tablet . °

## 2016-02-08 ENCOUNTER — Telehealth: Payer: Self-pay | Admitting: *Deleted

## 2016-02-08 ENCOUNTER — Telehealth: Payer: Self-pay | Admitting: Neurology

## 2016-02-08 MED ORDER — PREGABALIN 150 MG PO CAPS: 150.0000 mg | ORAL_CAPSULE | Freq: Two times a day (BID) | ORAL | Status: DC

## 2016-02-08 NOTE — Telephone Encounter (Signed)
Patient is calling and states she had a seizure Friday night in which she became completely unconscious, therefore, she does not know how long it lasted.  When she passed out she fell and hurt her foot and is going to her orthopedic doctor today.  If you think she needs to come in please give her a call.  She would otherwise prefer to wait to her appt in April if that is OK. Thanks!

## 2016-02-08 NOTE — Telephone Encounter (Signed)
I called patient. The patient had a probable seizure 3 days ago. The patient lost conscious, she injured her foot, she will be seen through orthopedic surgery to determine whether she fractured her foot or not. The patient is maximized on the Zonegran dose, we will go up on the Lyrica to taking 150 mg twice daily. She will take 100 mg in the morning and 150 mg in the evening for 2 weeks, then go to 150 mg twice daily. We will see her in revisit in April. She is not to operate a motor vehicle.

## 2016-02-08 NOTE — Telephone Encounter (Signed)
Lyrica rx. [facility]ed to [facility] fax # [phone]/[id number]

## 2016-02-19 ENCOUNTER — Other Ambulatory Visit: Payer: Self-pay | Admitting: Neurology

## 2016-02-28 ENCOUNTER — Other Ambulatory Visit: Payer: Self-pay | Admitting: Family Medicine

## 2016-02-29 NOTE — Telephone Encounter (Signed)
Call in #30 only. She needs an OV soon  

## 2016-03-02 ENCOUNTER — Other Ambulatory Visit: Payer: Self-pay | Admitting: Neurology

## 2016-03-02 MED ORDER — HYDROCODONE-ACETAMINOPHEN 5-325 MG PO TABS: 1.0000 | ORAL_TABLET | Freq: Four times a day (QID) | ORAL | Status: DC | PRN

## 2016-03-02 NOTE — Telephone Encounter (Signed)
Patient is calling to get a written Rx for HYDROcodone-acetaminophen (NORCO/VICODIN) 5-325 MG tablet. I advised the Rx will be ready in 24 hours unless the nurse advises otherwise.

## 2016-03-11 DIAGNOSIS — F250 Schizoaffective disorder, bipolar type: Secondary | ICD-10-CM | POA: Diagnosis not present

## 2016-03-14 ENCOUNTER — Telehealth: Payer: Self-pay | Admitting: Neurology

## 2016-03-14 NOTE — Telephone Encounter (Signed)
[facility] will place Botox on hold until PA is secured.

## 2016-03-15 ENCOUNTER — Ambulatory Visit: Payer: BLUE CROSS/BLUE SHIELD | Admitting: Neurology

## 2016-03-22 ENCOUNTER — Other Ambulatory Visit: Payer: Self-pay

## 2016-03-22 ENCOUNTER — Ambulatory Visit: Payer: Self-pay | Admitting: Neurology

## 2016-03-22 DIAGNOSIS — Z1231 Encounter for screening mammogram for malignant neoplasm of breast: Secondary | ICD-10-CM

## 2016-03-23 ENCOUNTER — Ambulatory Visit (INDEPENDENT_AMBULATORY_CARE_PROVIDER_SITE_OTHER): Payer: BLUE CROSS/BLUE SHIELD | Admitting: Family Medicine

## 2016-03-23 ENCOUNTER — Encounter: Payer: Self-pay | Admitting: Family Medicine

## 2016-03-23 VITALS — BP 93/62 | HR 58 | Temp 97.8°F | Ht 61.0 in | Wt 120.0 lb

## 2016-03-23 DIAGNOSIS — F329 Major depressive disorder, single episode, unspecified: Secondary | ICD-10-CM | POA: Diagnosis not present

## 2016-03-23 DIAGNOSIS — F32A Depression, unspecified: Secondary | ICD-10-CM

## 2016-03-23 DIAGNOSIS — G4700 Insomnia, unspecified: Secondary | ICD-10-CM

## 2016-03-23 DIAGNOSIS — I10 Essential (primary) hypertension: Secondary | ICD-10-CM

## 2016-03-23 LAB — HEPATIC FUNCTION PANEL
ALBUMIN: 3.8 g/dL (ref 3.5–5.2)
ALK PHOS: 51 U/L (ref 39–117)
ALT: 11 U/L (ref 0–35)
AST: 14 U/L (ref 0–37)
Bilirubin, Direct: 0 mg/dL (ref 0.0–0.3)
TOTAL PROTEIN: 7.1 g/dL (ref 6.0–8.3)
Total Bilirubin: 0.2 mg/dL (ref 0.2–1.2)

## 2016-03-23 LAB — CBC WITH DIFFERENTIAL/PLATELET
BASOS ABS: 0 10*3/uL (ref 0.0–0.1)
Basophils Relative: 0.3 % (ref 0.0–3.0)
EOS ABS: 0.1 10*3/uL (ref 0.0–0.7)
Eosinophils Relative: 1.4 % (ref 0.0–5.0)
HCT: 35.9 % — ABNORMAL LOW (ref 36.0–46.0)
Hemoglobin: 12.1 g/dL (ref 12.0–15.0)
LYMPHS ABS: 1.2 10*3/uL (ref 0.7–4.0)
LYMPHS PCT: 24.5 % (ref 12.0–46.0)
MCHC: 33.8 g/dL (ref 30.0–36.0)
MCV: 96.9 fl (ref 78.0–100.0)
MONO ABS: 0.3 10*3/uL (ref 0.1–1.0)
Monocytes Relative: 5.3 % (ref 3.0–12.0)
NEUTROS ABS: 3.3 10*3/uL (ref 1.4–7.7)
NEUTROS PCT: 68.5 % (ref 43.0–77.0)
PLATELETS: 264 10*3/uL (ref 150.0–400.0)
RBC: 3.7 Mil/uL — ABNORMAL LOW (ref 3.87–5.11)
RDW: 13 % (ref 11.5–15.5)
WBC: 4.8 10*3/uL (ref 4.0–10.5)

## 2016-03-23 LAB — BASIC METABOLIC PANEL
BUN: 21 mg/dL (ref 6–23)
CALCIUM: 9.4 mg/dL (ref 8.4–10.5)
CO2: 27 meq/L (ref 19–32)
Chloride: 105 mEq/L (ref 96–112)
Creatinine, Ser: 1.44 mg/dL — ABNORMAL HIGH (ref 0.40–1.20)
GFR: 41.07 mL/min — ABNORMAL LOW (ref >60.00)
GLUCOSE: 127 mg/dL — AB (ref 70–99)
Potassium: 3.7 mEq/L (ref 3.5–5.1)
SODIUM: 138 meq/L (ref 135–145)

## 2016-03-23 LAB — TSH: TSH: 0.6 u[IU]/mL (ref 0.35–4.50)

## 2016-03-23 MED ORDER — ZOLPIDEM TARTRATE 10 MG PO TABS: ORAL_TABLET | ORAL | Status: DC

## 2016-03-23 NOTE — Progress Notes (Signed)
Pre visit review using our clinic review tool, if applicable. No additional management support is needed unless otherwise documented below in the visit note. 

## 2016-03-23 NOTE — Progress Notes (Signed)
   Subjective:    Patient ID: [name], female    DOB: 05/03/1966, 50 y.o.   MRN: [id number]  HPI Here with her husband to discuss some recent fatigue she has been experiencing. Her depression and insomnia have been stable, and she recently saw Dr.  SinkBraden, her psychiatrist. She is seen every 3 months at her husband's work site wellness center, and she was seen by Rema FendtMaureen Golden NP there on 03-10-16. Her BP that day was 88/60 and she was told that her medications may be too strong.    Review of Systems  Constitutional: Positive for fatigue.  Respiratory: Negative.   Cardiovascular: Negative.   Neurological: Negative.   Psychiatric/Behavioral: Positive for sleep disturbance, dysphoric mood and decreased concentration. Negative for hallucinations, confusion and agitation. The patient is nervous/anxious.        Objective:   Physical Exam  Constitutional: She is oriented to person, place, and time. She appears well-developed and well-nourished.  Neck: No thyromegaly present.  Cardiovascular: Normal rate, regular rhythm, normal heart sounds and intact distal pulses.   Pulmonary/Chest: Effort normal and breath sounds normal.  Musculoskeletal: She exhibits no edema.  Lymphadenopathy:    She has no cervical adenopathy.  Neurological: She is alert and oriented to person, place, and time.          Assessment & Plan:  I agree that she is being overtreated for the HTN. She will decrease the dose of Metoprolol to 50 mg ( or 1/2 tab) daily. Get labs today. They will report to me in 2 weeks how she is feeling and what her BP readings are at home.  Nelwyn SalisburyFRY,STEPHEN A, MD

## 2016-03-29 NOTE — Telephone Encounter (Signed)
Shannon SeatsElizabeth [name] with [employer] states if you have additional information for your appeal it can be faxed to [phone].  Her phone # is [phone].

## 2016-03-30 ENCOUNTER — Telehealth: Payer: Self-pay | Admitting: Neurology

## 2016-03-30 DIAGNOSIS — G43701 Chronic migraine without aura, not intractable, with status migrainosus: Secondary | ICD-10-CM

## 2016-03-30 NOTE — Telephone Encounter (Signed)
Pt request refill for HYDROcodone-acetaminophen (NORCO/VICODIN) 5-325 MG tablet . Pt will be out before Dr Anne HahnWillis returns

## 2016-03-31 MED ORDER — HYDROCODONE-ACETAMINOPHEN 5-325 MG PO TABS: 1.0000 | ORAL_TABLET | Freq: Two times a day (BID) | ORAL | Status: DC | PRN

## 2016-03-31 NOTE — Telephone Encounter (Signed)
Patient request refill for norco, but has no follow up appointment. Pt had appt on 03/22/2016 with Dr.Willsi and it was cancel. Rn will ask work in [place] if it can be refill.

## 2016-03-31 NOTE — Telephone Encounter (Signed)
Patient's insurance has not covered Botox for this reason January 10 April 10 of April 18 appointments for consult there has been no follow-up appointment made. Refer back to primary care physician. I will be happy to give her pain medication until Monday when Dr. Anne HahnWillis can address this problem.CD

## 2016-04-04 ENCOUNTER — Other Ambulatory Visit: Payer: Self-pay | Admitting: Neurology

## 2016-04-04 DIAGNOSIS — G43701 Chronic migraine without aura, not intractable, with status migrainosus: Secondary | ICD-10-CM

## 2016-04-04 MED ORDER — HYDROCODONE-ACETAMINOPHEN 5-325 MG PO TABS: 1.0000 | ORAL_TABLET | Freq: Four times a day (QID) | ORAL | Status: DC | PRN

## 2016-04-04 NOTE — Telephone Encounter (Signed)
Pt's husband returned rx for short-fill (#30 written by Dr. Vickey Hugerohmeier last week) saying that pt did not need to have it filled over the weekend. Requesting new rx w/ usual quantity (#75). Called pt back to schedule f/u appt but husband says that she is not home at this time. He will have her call to schedule 3 month follow-up appt some time this month as insurance did not approve Botox.

## 2016-04-04 NOTE — Telephone Encounter (Signed)
The patient typically gets 75 tablets a month, only got 30. Follow-up appointment will be made prior to next refill.

## 2016-04-04 NOTE — Progress Notes (Signed)
The patient was given a prescription for 30 hydrocodone last week, she brought back the prescription, a prescription for 75 of the hydrocodone will be given today, this is her usual prescription. This must last 28 days.

## 2016-04-04 NOTE — Addendum Note (Signed)
Addended by: Donnelly AngelicaHOGAN, JENNIFER L on: 04/04/2016 12:54 PM   Modules accepted: Orders

## 2016-04-04 NOTE — Progress Notes (Signed)
Rx printed, signed, up front for pick-up. 

## 2016-04-05 NOTE — Telephone Encounter (Signed)
Spoke to pt. Says that she received a call earlier today saying that Botox was approved. She is scheduled 5/18 to come in for injections.

## 2016-04-05 NOTE — Telephone Encounter (Signed)
Pt returned Jennifer's call. °

## 2016-04-14 ENCOUNTER — Telehealth: Payer: Self-pay | Admitting: Neurology

## 2016-04-14 NOTE — Telephone Encounter (Signed)
Spoke with the pharmacy to schedule delivery and they stated that they were waiting on the patient to give consent and they had tried calling her with no answer. I called the patient and spoke with her. Informed her that they were waiting for consent and gave her their phone number, [phone].

## 2016-04-18 DIAGNOSIS — G43719 Chronic migraine without aura, intractable, without status migrainosus: Secondary | ICD-10-CM | POA: Diagnosis not present

## 2016-04-21 ENCOUNTER — Ambulatory Visit (INDEPENDENT_AMBULATORY_CARE_PROVIDER_SITE_OTHER): Payer: BLUE CROSS/BLUE SHIELD | Admitting: Neurology

## 2016-04-21 ENCOUNTER — Encounter: Payer: Self-pay | Admitting: Neurology

## 2016-04-21 VITALS — BP 114/62 | HR 66 | Ht 61.0 in | Wt 121.0 lb

## 2016-04-21 DIAGNOSIS — G43711 Chronic migraine without aura, intractable, with status migrainosus: Secondary | ICD-10-CM

## 2016-04-21 DIAGNOSIS — G43719 Chronic migraine without aura, intractable, without status migrainosus: Secondary | ICD-10-CM | POA: Diagnosis not present

## 2016-04-21 NOTE — Procedures (Signed)
     BOTOX PROCEDURE NOTE FOR MIGRAINE HEADACHE   HISTORY: [name] is a 50 year old patient with a history of intractable migraine headache. The patient has been having headaches about every other day at this point. She has gained some benefit with the Botox in the past. She does take opiate medications frequently for her headaches. She returns for a Botox injection today.   Description of procedure:  The patient was placed in a sitting position. The standard protocol was used for Botox as follows, with 5 units of Botox injected at each site:   -Procerus muscle, midline injection  -Corrugator muscle, bilateral injection  -Frontalis muscle, bilateral injection, with 2 sites each side, medial injection was performed in the upper one third of the frontalis muscle, in the region vertical from the medial inferior edge of the superior orbital rim. The lateral injection was again in the upper one third of the forehead vertically above the lateral limbus of the cornea, 1.5 cm lateral to the medial injection site.  -Temporalis muscle injection, 4 sites, bilaterally. The first injection was 3 cm above the tragus of the ear, second injection site was 1.5 cm to 3 cm up from the first injection site in line with the tragus of the ear. The third injection site was 1.5-3 cm forward between the first 2 injection sites. The fourth injection site was 1.5 cm posterior to the second injection site.  -Occipitalis muscle injection, 3 sites, bilaterally. The first injection was done one half way between the occipital protuberance and the tip of the mastoid process behind the ear. The second injection site was done lateral and superior to the first, 1 fingerbreadth from the first injection. The third injection site was 1 fingerbreadth superiorly and medially from the first injection site.  -Cervical paraspinal muscle injection, 2 sites, bilateral, the first injection site was 1 cm from the midline of the  cervical spine, 3 cm inferior to the lower border of the occipital protuberance. The second injection site was 1.5 cm superiorly and laterally to the first injection site.  -Trapezius muscle injection was performed at 3 sites, bilaterally. The first injection site was in the upper trapezius muscle halfway between the inflection point of the neck, and the acromion. The second injection site was one half way between the acromion and the first injection site. The third injection was done between the first injection site and the inflection point of the neck.   A 200 unit bottle of Botox was used, 155 units were injected, the rest of the Botox was wasted. The patient tolerated the procedure well, there were no complications of the above procedure.  Botox NDC [id number] Lot number [id number] Expiration date December 2019

## 2016-04-21 NOTE — Progress Notes (Signed)
Please refer to Botox procedure note. 

## 2016-04-24 ENCOUNTER — Other Ambulatory Visit: Payer: Self-pay | Admitting: Neurology

## 2016-04-26 ENCOUNTER — Ambulatory Visit
Admission: RE | Admit: 2016-04-26 | Discharge: 2016-04-26 | Disposition: A | Discharge status: Home / Self Care | Payer: BLUE CROSS/BLUE SHIELD | Source: Ambulatory Visit

## 2016-04-26 DIAGNOSIS — Z1231 Encounter for screening mammogram for malignant neoplasm of breast: Secondary | ICD-10-CM

## 2016-05-05 ENCOUNTER — Other Ambulatory Visit: Payer: Self-pay | Admitting: Neurology

## 2016-05-05 DIAGNOSIS — G43701 Chronic migraine without aura, not intractable, with status migrainosus: Secondary | ICD-10-CM

## 2016-05-05 MED ORDER — HYDROCODONE-ACETAMINOPHEN 5-325 MG PO TABS: 1.0000 | ORAL_TABLET | Freq: Four times a day (QID) | ORAL | Status: DC | PRN

## 2016-05-05 NOTE — Telephone Encounter (Signed)
Patient requesting refill of HYDROcodone-acetaminophen (NORCO/VICODIN) 5-325 MG tablet. I advised the Rx will be ready in 24 hours unless otherwise advised by the nurse. The patient will call tomorrow to see if ready.

## 2016-05-05 NOTE — Telephone Encounter (Signed)
Last OV was 04/21/16 w/ f/u scheduled 07/28/16. Last filled 04/04/16.

## 2016-05-06 NOTE — Telephone Encounter (Signed)
Rx printed, signed, up front for pick-up. 

## 2016-05-08 ENCOUNTER — Other Ambulatory Visit: Payer: Self-pay | Admitting: Family Medicine

## 2016-05-20 ENCOUNTER — Other Ambulatory Visit: Payer: Self-pay | Admitting: Family Medicine

## 2016-06-08 ENCOUNTER — Other Ambulatory Visit: Payer: Self-pay | Admitting: Neurology

## 2016-06-08 DIAGNOSIS — G43701 Chronic migraine without aura, not intractable, with status migrainosus: Secondary | ICD-10-CM

## 2016-06-08 MED ORDER — HYDROCODONE-ACETAMINOPHEN 5-325 MG PO TABS: 1.0000 | ORAL_TABLET | Freq: Four times a day (QID) | ORAL | Status: DC | PRN

## 2016-06-08 NOTE — Telephone Encounter (Signed)
Last OV 04/21/16 F/U scheduled 07/28/16 Last RF 05/05/16

## 2016-06-08 NOTE — Telephone Encounter (Signed)
Patient requesting refill of HYDROcodone-acetaminophen (NORCO/VICODIN) 5-325 MG tablet  °Pharmacy: pick up ° °

## 2016-06-08 NOTE — Addendum Note (Signed)
Addended by: Donnelly AngelicaHOGAN, Ludwin Flahive L on: 06/08/2016 08:50 AM   Modules accepted: Orders

## 2016-06-08 NOTE — Telephone Encounter (Signed)
Rx printed, signed, up front for pick-up. 

## 2016-06-22 DIAGNOSIS — F250 Schizoaffective disorder, bipolar type: Secondary | ICD-10-CM | POA: Diagnosis not present

## 2016-06-24 ENCOUNTER — Ambulatory Visit: Payer: BLUE CROSS/BLUE SHIELD | Admitting: Neurology

## 2016-07-06 ENCOUNTER — Other Ambulatory Visit: Payer: Self-pay | Admitting: Neurology

## 2016-07-06 DIAGNOSIS — G43701 Chronic migraine without aura, not intractable, with status migrainosus: Secondary | ICD-10-CM

## 2016-07-06 MED ORDER — HYDROCODONE-ACETAMINOPHEN 5-325 MG PO TABS: 1.0000 | ORAL_TABLET | Freq: Four times a day (QID) | ORAL | 0 refills | Status: DC | PRN

## 2016-07-06 NOTE — Addendum Note (Signed)
Addended by: Donnelly Angelica on: 07/06/2016 04:04 PM   Modules accepted: Orders

## 2016-07-06 NOTE — Telephone Encounter (Signed)
Patient requesting refill of HYDROcodone-acetaminophen (NORCO/VICODIN) 5-325 MG tablet  °Pharmacy: pick up ° °

## 2016-07-06 NOTE — Telephone Encounter (Signed)
Rx printed, signed, up front for pick-up. 

## 2016-07-06 NOTE — Telephone Encounter (Signed)
OV 04/21/16 FU 07/28/17 RF 06/08/16

## 2016-07-21 ENCOUNTER — Telehealth: Payer: Self-pay | Admitting: Neurology

## 2016-07-21 DIAGNOSIS — G43719 Chronic migraine without aura, intractable, without status migrainosus: Secondary | ICD-10-CM | POA: Diagnosis not present

## 2016-07-21 NOTE — Telephone Encounter (Signed)
Jocelyn/[facility] [phone] called to schedule delivery for botox. Please call

## 2016-07-26 NOTE — Telephone Encounter (Signed)
Danielle her medication was scheduled to arrive on 07-22-2016 it should be there did it not arrive ?

## 2016-07-26 NOTE — Telephone Encounter (Signed)
Shannon HarmanDana would you mind calling to schedule this?

## 2016-07-27 NOTE — Telephone Encounter (Signed)
[name] it is here

## 2016-07-28 ENCOUNTER — Encounter: Payer: Self-pay | Admitting: Neurology

## 2016-07-28 ENCOUNTER — Ambulatory Visit (INDEPENDENT_AMBULATORY_CARE_PROVIDER_SITE_OTHER): Payer: BLUE CROSS/BLUE SHIELD | Admitting: Neurology

## 2016-07-28 VITALS — BP 120/78 | HR 56 | Ht 61.0 in | Wt 119.0 lb

## 2016-07-28 DIAGNOSIS — G43711 Chronic migraine without aura, intractable, with status migrainosus: Secondary | ICD-10-CM | POA: Diagnosis not present

## 2016-07-28 NOTE — Progress Notes (Signed)
Please refer to Botox therapy note.

## 2016-07-28 NOTE — Procedures (Signed)
     BOTOX PROCEDURE NOTE FOR MIGRAINE HEADACHE   HISTORY: Juanetta GoslingMelinda Aguon is a 50 year old patient with a history of intractable migraine headache. The patient was having virtually daily headaches, but with Botox treatments she has improved dramatically having 1 or 2 headaches a week. The patient has had more frequent headaches recently because she is under stress as her mother has a terminal illness and is expected to pass away within the next several weeks. The patient believes that the Botox has been beneficial in reducing the frequency and severity of her headaches. She returns for another Botox therapy.   Description of procedure:  The patient was placed in a sitting position. The standard protocol was used for Botox as follows, with 5 units of Botox injected at each site:   -Procerus muscle, midline injection  -Corrugator muscle, bilateral injection  -Frontalis muscle, bilateral injection, with 2 sites each side, medial injection was performed in the upper one third of the frontalis muscle, in the region vertical from the medial inferior edge of the superior orbital rim. The lateral injection was again in the upper one third of the forehead vertically above the lateral limbus of the cornea, 1.5 cm lateral to the medial injection site.  -Temporalis muscle injection, 4 sites, bilaterally. The first injection was 3 cm above the tragus of the ear, second injection site was 1.5 cm to 3 cm up from the first injection site in line with the tragus of the ear. The third injection site was 1.5-3 cm forward between the first 2 injection sites. The fourth injection site was 1.5 cm posterior to the second injection site.  -Occipitalis muscle injection, 3 sites, bilaterally. The first injection was done one half way between the occipital protuberance and the tip of the mastoid process behind the ear. The second injection site was done lateral and superior to the first, 1 fingerbreadth from the first  injection. The third injection site was 1 fingerbreadth superiorly and medially from the first injection site.  -Cervical paraspinal muscle injection, 2 sites, bilateral, the first injection site was 1 cm from the midline of the cervical spine, 3 cm inferior to the lower border of the occipital protuberance. The second injection site was 1.5 cm superiorly and laterally to the first injection site.  -Trapezius muscle injection was performed at 3 sites, bilaterally. The first injection site was in the upper trapezius muscle halfway between the inflection point of the neck, and the acromion. The second injection site was one half way between the acromion and the first injection site. The third injection was done between the first injection site and the inflection point of the neck.   A 200 unit bottle of Botox was used, 155 units were injected, the rest of the Botox was wasted. The patient tolerated the procedure well, there were no complications of the above procedure.  Botox NDC [id number] Lot number [id number] Expiration date February 2020

## 2016-08-04 ENCOUNTER — Other Ambulatory Visit: Payer: Self-pay | Admitting: Neurology

## 2016-08-04 ENCOUNTER — Telehealth: Payer: Self-pay | Admitting: *Deleted

## 2016-08-04 DIAGNOSIS — G43701 Chronic migraine without aura, not intractable, with status migrainosus: Secondary | ICD-10-CM

## 2016-08-04 NOTE — Telephone Encounter (Signed)
Pt was seen in office last week and has follow-up scheduled in November. Last refill was 07/06/16.

## 2016-08-04 NOTE — Telephone Encounter (Signed)
Patient requesting refill of HYDROcodone-acetaminophen (NORCO/VICODIN) 5-325 MG tablet  °Pharmacy: pick up ° °

## 2016-08-05 MED ORDER — HYDROCODONE-ACETAMINOPHEN 5-325 MG PO TABS: 1.0000 | ORAL_TABLET | Freq: Four times a day (QID) | ORAL | 0 refills | Status: DC | PRN

## 2016-08-05 NOTE — Telephone Encounter (Signed)
Rx printed, signed, up front for pick-up. 

## 2016-08-12 ENCOUNTER — Telehealth: Payer: Self-pay | Admitting: Neurology

## 2016-08-15 ENCOUNTER — Other Ambulatory Visit: Payer: Self-pay

## 2016-08-15 MED ORDER — PREGABALIN 150 MG PO CAPS: 150.0000 mg | ORAL_CAPSULE | Freq: Two times a day (BID) | ORAL | 1 refills | Status: DC

## 2016-08-15 NOTE — Telephone Encounter (Signed)
Pt called said she is out of Lyrica on Friday and doesn' know why it has been denied. Please call at [phone]

## 2016-08-15 NOTE — Telephone Encounter (Signed)
Spoke to pharmacy who reported that rx had expired. New Lyrica script printed, awaiting signature.

## 2016-08-15 NOTE — Addendum Note (Signed)
Addended by: Donnelly AngelicaHOGAN, Kailen Name L on: 08/15/2016 09:35 AM   Modules accepted: Orders

## 2016-08-15 NOTE — Telephone Encounter (Signed)
Rx printed, signed, faxed to pharmacy. 

## 2016-08-26 DIAGNOSIS — F250 Schizoaffective disorder, bipolar type: Secondary | ICD-10-CM | POA: Diagnosis not present

## 2016-09-05 ENCOUNTER — Other Ambulatory Visit: Payer: Self-pay | Admitting: Neurology

## 2016-09-05 DIAGNOSIS — G43701 Chronic migraine without aura, not intractable, with status migrainosus: Secondary | ICD-10-CM

## 2016-09-05 MED ORDER — HYDROCODONE-ACETAMINOPHEN 5-325 MG PO TABS: 1.0000 | ORAL_TABLET | Freq: Four times a day (QID) | ORAL | 0 refills | Status: DC | PRN

## 2016-09-05 NOTE — Telephone Encounter (Signed)
Patient called to request refill of HYDROcodone-acetaminophen (NORCO/VICODIN) 5-325 MG tablet °

## 2016-09-05 NOTE — Telephone Encounter (Signed)
Rx printed, signed, up front for pick-up. 

## 2016-09-05 NOTE — Telephone Encounter (Signed)
Last OV was in Aug and pt has f/u w/ Dr. Anne HahnWillis scheduled in Nov. Last rx 08/05/16.

## 2016-09-26 ENCOUNTER — Other Ambulatory Visit: Payer: Self-pay | Admitting: Family Medicine

## 2016-09-27 NOTE — Telephone Encounter (Signed)
Call in #30 with 5 rf 

## 2016-10-07 DIAGNOSIS — F250 Schizoaffective disorder, bipolar type: Secondary | ICD-10-CM | POA: Diagnosis not present

## 2016-10-10 ENCOUNTER — Other Ambulatory Visit: Payer: Self-pay | Admitting: Neurology

## 2016-10-10 DIAGNOSIS — G43701 Chronic migraine without aura, not intractable, with status migrainosus: Secondary | ICD-10-CM

## 2016-10-10 MED ORDER — HYDROCODONE-ACETAMINOPHEN 5-325 MG PO TABS: 1.0000 | ORAL_TABLET | Freq: Four times a day (QID) | ORAL | 0 refills | Status: DC | PRN

## 2016-10-10 NOTE — Telephone Encounter (Signed)
Pt request refill for HYDROcodone-acetaminophen (NORCO/VICODIN) 5-325 MG tablet °

## 2016-10-10 NOTE — Telephone Encounter (Signed)
Pt had last Botox in Aug and is due to return for treatment at the end of this month. Last rx written 09/05/16.

## 2016-10-10 NOTE — Telephone Encounter (Signed)
Rx printed, signed, up front for pick-up. 

## 2016-10-19 ENCOUNTER — Telehealth: Payer: Self-pay | Admitting: Neurology

## 2016-10-19 DIAGNOSIS — G43719 Chronic migraine without aura, intractable, without status migrainosus: Secondary | ICD-10-CM | POA: Diagnosis not present

## 2016-10-19 NOTE — Telephone Encounter (Signed)
[facility]  [phone] called and relayed they had no Botox in stock until Thursday. I relayed Patient's appointment is 10/20/2016 . Ariel relayed she was very sorry I relayed to [employer] we can use our stock . and [facility] will delivery 10/21/2016 before the office closes at noon.   Victorino DikeJennifer please use [facility] . Thanks Annabelle Harmanana

## 2016-10-20 ENCOUNTER — Telehealth: Payer: Self-pay | Admitting: Neurology

## 2016-10-20 ENCOUNTER — Ambulatory Visit (INDEPENDENT_AMBULATORY_CARE_PROVIDER_SITE_OTHER): Payer: BLUE CROSS/BLUE SHIELD | Admitting: Neurology

## 2016-10-20 ENCOUNTER — Encounter: Payer: Self-pay | Admitting: Neurology

## 2016-10-20 VITALS — BP 111/76 | HR 57 | Ht 61.0 in | Wt 117.0 lb

## 2016-10-20 DIAGNOSIS — G43711 Chronic migraine without aura, intractable, with status migrainosus: Secondary | ICD-10-CM | POA: Diagnosis not present

## 2016-10-20 NOTE — Telephone Encounter (Signed)
Only 30 day rx covered by pt's insurance per pharmacy. Pt has refills available through Feb but please send in #30 w/ next refill. Called and discussed w/ pt as well.

## 2016-10-20 NOTE — Progress Notes (Signed)
200 units x 1 [facility]ial [facility] [phone]  Lot No [id number] Exp Date 05 2020

## 2016-10-20 NOTE — Procedures (Signed)
     BOTOX PROCEDURE NOTE FOR MIGRAINE HEADACHE   HISTORY: [name] is a 50 year old patient with a history of intractable migraine headaches. The patient has done well with the Botox, her headaches will reduce to having only one or 2 headaches a week after the Botox injections, the headaches will tend to convert to being daily again within 2 weeks of the next Botox injection. The patient clearly believes that this therapy has been effective.   Description of procedure:  The patient was placed in a sitting position. The standard protocol was used for Botox as follows, with 5 units of Botox injected at each site:   -Procerus muscle, midline injection  -Corrugator muscle, bilateral injection  -Frontalis muscle, bilateral injection, with 2 sites each side, medial injection was performed in the upper one third of the frontalis muscle, in the region vertical from the medial inferior edge of the superior orbital rim. The lateral injection was again in the upper one third of the forehead vertically above the lateral limbus of the cornea, 1.5 cm lateral to the medial injection site.  -Temporalis muscle injection, 4 sites, bilaterally. The first injection was 3 cm above the tragus of the ear, second injection site was 1.5 cm to 3 cm up from the first injection site in line with the tragus of the ear. The third injection site was 1.5-3 cm forward between the first 2 injection sites. The fourth injection site was 1.5 cm posterior to the second injection site.  -Occipitalis muscle injection, 3 sites, bilaterally. The first injection was done one half way between the occipital protuberance and the tip of the mastoid process behind the ear. The second injection site was done lateral and superior to the first, 1 fingerbreadth from the first injection. The third injection site was 1 fingerbreadth superiorly and medially from the first injection site.  -Cervical paraspinal muscle injection, 2 sites,  bilateral, the first injection site was 1 cm from the midline of the cervical spine, 3 cm inferior to the lower border of the occipital protuberance. The second injection site was 1.5 cm superiorly and laterally to the first injection site.  -Trapezius muscle injection was performed at 3 sites, bilaterally. The first injection site was in the upper trapezius muscle halfway between the inflection point of the neck, and the acromion. The second injection site was one half way between the acromion and the first injection site. The third injection was done between the first injection site and the inflection point of the neck.   A 200 unit bottle of Botox was used, 155 units were injected, the rest of the Botox was wasted. The patient tolerated the procedure well, there were no complications of the above procedure.  Botox NDC [id number] Lot number [id number] Expiration date May 2020

## 2016-10-20 NOTE — Telephone Encounter (Signed)
Patient requesting a call back regarding Lyrica refills - problem at the pharmacy.  Best call back is [phone]

## 2016-10-20 NOTE — Telephone Encounter (Signed)
Pt needs 12 week botox appt.

## 2016-10-21 NOTE — Telephone Encounter (Signed)
[name] would you mind calling to schedule this patient?

## 2016-10-24 DIAGNOSIS — H5203 Hypermetropia, bilateral: Secondary | ICD-10-CM | POA: Diagnosis not present

## 2016-10-25 NOTE — Telephone Encounter (Signed)
Patient scheduled for Botox on 01-25-17 at 1:00pm with Dr. Anne HahnWillis.

## 2016-11-09 ENCOUNTER — Other Ambulatory Visit: Payer: Self-pay | Admitting: *Deleted

## 2016-11-09 DIAGNOSIS — G43701 Chronic migraine without aura, not intractable, with status migrainosus: Secondary | ICD-10-CM

## 2016-11-09 MED ORDER — HYDROCODONE-ACETAMINOPHEN 5-325 MG PO TABS: 1.0000 | ORAL_TABLET | Freq: Four times a day (QID) | ORAL | 0 refills | Status: DC | PRN

## 2016-11-09 NOTE — Telephone Encounter (Signed)
Rx printed, signed, up front for pick-up. 

## 2016-11-09 NOTE — Telephone Encounter (Signed)
Pt was seen in Nov and has follow-up scheduled in Feb. Last rx written 10/10/16.

## 2016-12-12 ENCOUNTER — Other Ambulatory Visit: Payer: Self-pay | Admitting: Neurology

## 2016-12-12 DIAGNOSIS — G43701 Chronic migraine without aura, not intractable, with status migrainosus: Secondary | ICD-10-CM

## 2016-12-12 MED ORDER — HYDROCODONE-ACETAMINOPHEN 5-325 MG PO TABS: 1.0000 | ORAL_TABLET | Freq: Four times a day (QID) | ORAL | 0 refills | Status: DC | PRN

## 2016-12-12 NOTE — Telephone Encounter (Signed)
Rx printed, signed, up front for pick-up. 

## 2016-12-12 NOTE — Telephone Encounter (Signed)
Pt requesting refill for HYDROcodone-acetaminophen (NORCO/VICODIN) 5-325 MG tablet.  ° °

## 2016-12-12 NOTE — Addendum Note (Signed)
Addended by: Donnelly AngelicaHOGAN, Eddy Termine L on: 12/12/2016 11:11 AM   Modules accepted: Orders

## 2016-12-12 NOTE — Telephone Encounter (Signed)
Pt had OV in Nov and has follow-up scheduled in Feb. Last rx written 11/09/16.

## 2016-12-15 DIAGNOSIS — F250 Schizoaffective disorder, bipolar type: Secondary | ICD-10-CM | POA: Diagnosis not present

## 2016-12-26 ENCOUNTER — Other Ambulatory Visit: Payer: Self-pay | Admitting: Family Medicine

## 2017-01-12 ENCOUNTER — Telehealth: Payer: Self-pay | Admitting: Neurology

## 2017-01-12 DIAGNOSIS — G43701 Chronic migraine without aura, not intractable, with status migrainosus: Secondary | ICD-10-CM

## 2017-01-12 NOTE — Telephone Encounter (Signed)
Patient has called back states she will have enough HYDROcodone-acetaminophen (NORCO/VICODIN) 5-325 MG tablet until Monday when Dr. Anne HahnWillis returns to the office.  But wants to be sure she will be able to pick up the prescription on Monday.  Please call

## 2017-01-12 NOTE — Telephone Encounter (Signed)
Dr Epimenio FootSater- [facility]. Are you ok with refilling this for the patient?

## 2017-01-12 NOTE — Telephone Encounter (Signed)
Patient requesting refill of HYDROcodone-acetaminophen (NORCO/VICODIN) 5-325 MG tablet. I advised Dr. Anne HahnWillis is out of the office until Monday but will send message to nurse.

## 2017-01-12 NOTE — Telephone Encounter (Signed)
Ok to refill.   I will sign

## 2017-01-12 NOTE — Telephone Encounter (Signed)
Called and spoke to pt. Asked if she had enough to get her through Monday until Dr Anne HahnWillis returns. She is going to check and call back to let me know.

## 2017-01-13 MED ORDER — HYDROCODONE-ACETAMINOPHEN 5-325 MG PO TABS: 1.0000 | ORAL_TABLET | Freq: Four times a day (QID) | ORAL | 0 refills | Status: DC | PRN

## 2017-01-13 NOTE — Telephone Encounter (Signed)
Called and spoke to pt. Advised since Dr Anne HahnWillis out of office, Dr Epimenio FootSater ok to refill rx. I placed up front for pick up. She verbalized understanding.

## 2017-01-13 NOTE — Telephone Encounter (Signed)
Printed rx, awaiting RS, MD signature.

## 2017-01-13 NOTE — Addendum Note (Signed)
Addended by: Hillis RangeKING, Marylynn Rigdon L on: 01/13/2017 08:12 AM   Modules accepted: Orders

## 2017-01-23 DIAGNOSIS — G43719 Chronic migraine without aura, intractable, without status migrainosus: Secondary | ICD-10-CM | POA: Diagnosis not present

## 2017-01-25 ENCOUNTER — Encounter: Payer: Self-pay | Admitting: Neurology

## 2017-01-25 ENCOUNTER — Ambulatory Visit (INDEPENDENT_AMBULATORY_CARE_PROVIDER_SITE_OTHER): Payer: BLUE CROSS/BLUE SHIELD | Admitting: Neurology

## 2017-01-25 VITALS — BP 109/70 | HR 59 | Ht 61.0 in

## 2017-01-25 DIAGNOSIS — G43711 Chronic migraine without aura, intractable, with status migrainosus: Secondary | ICD-10-CM | POA: Diagnosis not present

## 2017-01-25 MED ORDER — PREGABALIN 150 MG PO CAPS: 150.0000 mg | ORAL_CAPSULE | Freq: Two times a day (BID) | ORAL | 1 refills | Status: DC

## 2017-01-25 NOTE — Procedures (Signed)
     BOTOX PROCEDURE NOTE FOR MIGRAINE HEADACHE   HISTORY: [name] is a 51 year old patient with a history of intractable migraine headaches. The patient comes in for Botox injections today. The patient continues to gain good benefit with the Botox, she has had a markedly reduction in the headache frequency. Following injection, her headaches occur about once or twice a week, this significantly improves her ability to function, and reduces the amount of medication she requires. Within about 2 weeks prior to the next Botox injections, her headaches once again convert to being daily. She returns for injections today.   Description of procedure:  The patient was placed in a sitting position. The standard protocol was used for Botox as follows, with 5 units of Botox injected at each site:   -Procerus muscle, midline injection  -Corrugator muscle, bilateral injection  -Frontalis muscle, bilateral injection, with 2 sites each side, medial injection was performed in the upper one third of the frontalis muscle, in the region vertical from the medial inferior edge of the superior orbital rim. The lateral injection was again in the upper one third of the forehead vertically above the lateral limbus of the cornea, 1.5 cm lateral to the medial injection site.  -Temporalis muscle injection, 4 sites, bilaterally. The first injection was 3 cm above the tragus of the ear, second injection site was 1.5 cm to 3 cm up from the first injection site in line with the tragus of the ear. The third injection site was 1.5-3 cm forward between the first 2 injection sites. The fourth injection site was 1.5 cm posterior to the second injection site.  -Occipitalis muscle injection, 3 sites, bilaterally. The first injection was done one half way between the occipital protuberance and the tip of the mastoid process behind the ear. The second injection site was done lateral and superior to the first, 1 fingerbreadth  from the first injection. The third injection site was 1 fingerbreadth superiorly and medially from the first injection site.  -Cervical paraspinal muscle injection, 2 sites, bilateral, the first injection site was 1 cm from the midline of the cervical spine, 3 cm inferior to the lower border of the occipital protuberance. The second injection site was 1.5 cm superiorly and laterally to the first injection site.  -Trapezius muscle injection was performed at 3 sites, bilaterally. The first injection site was in the upper trapezius muscle halfway between the inflection point of the neck, and the acromion. The second injection site was one half way between the acromion and the first injection site. The third injection was done between the first injection site and the inflection point of the neck.   A 200 unit bottle of Botox was used, 155 units were injected, the rest of the Botox was wasted. The patient tolerated the procedure well, there were no complications of the above procedure.  Botox NDC [id number] Lot number [id number] Expiration date September 2020

## 2017-01-25 NOTE — Progress Notes (Signed)
Please refer to Botox procedure note. 

## 2017-02-13 ENCOUNTER — Telehealth: Payer: Self-pay | Admitting: Neurology

## 2017-02-13 DIAGNOSIS — G43701 Chronic migraine without aura, not intractable, with status migrainosus: Secondary | ICD-10-CM

## 2017-02-13 MED ORDER — HYDROCODONE-ACETAMINOPHEN 5-325 MG PO TABS: 1.0000 | ORAL_TABLET | Freq: Four times a day (QID) | ORAL | 0 refills | Status: DC | PRN

## 2017-02-13 NOTE — Telephone Encounter (Signed)
Pt request refill for HYDROcodone-acetaminophen (NORCO/VICODIN) 5-325 MG tablet °

## 2017-02-13 NOTE — Telephone Encounter (Signed)
Dr Anne HahnWillis- ok to refill? Last received rx 01/13/17.   Patient last seen 01/25/2017. No f/u scheduled at the moment

## 2017-02-13 NOTE — Telephone Encounter (Signed)
Called and spoke with pt. Advised rx ready to be picked up.  Also made 5 month f/u for 07/07/17 at 8am, check in 745am. Pt agreeable to this. Advised I will send message to our botox coordinator to call and schedule her next botox. She verbalized understanding.

## 2017-02-15 NOTE — Telephone Encounter (Signed)
I called to schedule botox injection, patient did not answer so I left a VM asking her to return my call.

## 2017-03-13 ENCOUNTER — Telehealth: Payer: Self-pay | Admitting: Neurology

## 2017-03-13 DIAGNOSIS — G43701 Chronic migraine without aura, not intractable, with status migrainosus: Secondary | ICD-10-CM

## 2017-03-13 MED ORDER — HYDROCODONE-ACETAMINOPHEN 5-325 MG PO TABS: 1.0000 | ORAL_TABLET | Freq: Four times a day (QID) | ORAL | 0 refills | Status: DC | PRN

## 2017-03-13 NOTE — Telephone Encounter (Signed)
Placed printed/signed rx hydrocodone-acetaminophen up front for pick up.

## 2017-03-13 NOTE — Addendum Note (Signed)
Addended by: York Spaniel on: 03/13/2017 10:37 AM   Modules accepted: Orders

## 2017-03-13 NOTE — Telephone Encounter (Signed)
The prescription for hydrocodone will be refilled. 

## 2017-03-13 NOTE — Telephone Encounter (Signed)
Pt has called in for refill of HYDROcodone-acetaminophen (NORCO/VICODIN) 5-325 MG tablet

## 2017-03-16 ENCOUNTER — Other Ambulatory Visit: Payer: Self-pay | Admitting: Family Medicine

## 2017-03-16 DIAGNOSIS — Z1231 Encounter for screening mammogram for malignant neoplasm of breast: Secondary | ICD-10-CM

## 2017-03-27 DIAGNOSIS — F250 Schizoaffective disorder, bipolar type: Secondary | ICD-10-CM | POA: Diagnosis not present

## 2017-03-30 ENCOUNTER — Other Ambulatory Visit: Payer: Self-pay | Admitting: Family Medicine

## 2017-03-31 NOTE — Telephone Encounter (Signed)
Call in #30 only. She needs an OV soon  

## 2017-04-07 ENCOUNTER — Telehealth: Payer: Self-pay | Admitting: Neurology

## 2017-04-07 DIAGNOSIS — G43701 Chronic migraine without aura, not intractable, with status migrainosus: Secondary | ICD-10-CM

## 2017-04-07 MED ORDER — HYDROCODONE-ACETAMINOPHEN 5-325 MG PO TABS: 1.0000 | ORAL_TABLET | Freq: Four times a day (QID) | ORAL | 0 refills | Status: DC | PRN

## 2017-04-07 NOTE — Telephone Encounter (Signed)
Pt request refill for HYDROcodone-acetaminophen (NORCO/VICODIN) 5-325 MG tablet . Pt advised RX will be ready within 24 hrs unless notified otherwise by RN. Pt aware clinic closes at noon today.

## 2017-04-07 NOTE — Telephone Encounter (Signed)
Hydrocodone will be refilled. 

## 2017-04-07 NOTE — Addendum Note (Signed)
Addended by: York SpanielWILLIS, Faiga Stones K on: 04/07/2017 12:58 PM   Modules accepted: Orders

## 2017-04-10 NOTE — Telephone Encounter (Signed)
Placed printed/signed rx up front for pick up. 

## 2017-04-27 ENCOUNTER — Ambulatory Visit: Payer: Self-pay

## 2017-05-03 ENCOUNTER — Telehealth: Payer: Self-pay | Admitting: Neurology

## 2017-05-03 ENCOUNTER — Other Ambulatory Visit: Payer: Self-pay | Admitting: Family Medicine

## 2017-05-03 DIAGNOSIS — G43701 Chronic migraine without aura, not intractable, with status migrainosus: Secondary | ICD-10-CM

## 2017-05-03 MED ORDER — HYDROCODONE-ACETAMINOPHEN 5-325 MG PO TABS: 1.0000 | ORAL_TABLET | Freq: Four times a day (QID) | ORAL | 0 refills | Status: DC | PRN

## 2017-05-03 NOTE — Addendum Note (Signed)
Addended by: York SpanielWILLIS, CHARLES K on: 05/03/2017 01:02 PM   Modules accepted: Orders

## 2017-05-03 NOTE — Telephone Encounter (Signed)
Pt request refill for HYDROcodone-acetaminophen (NORCO/VICODIN) 5-325 MG tablet °

## 2017-05-03 NOTE — Telephone Encounter (Signed)
.  Placed printed/signed rx hydrocodone up front for patient pick up.  

## 2017-05-03 NOTE — Telephone Encounter (Signed)
The hydrocodone will be refilled. 

## 2017-05-04 ENCOUNTER — Ambulatory Visit (INDEPENDENT_AMBULATORY_CARE_PROVIDER_SITE_OTHER): Payer: BLUE CROSS/BLUE SHIELD | Admitting: Neurology

## 2017-05-04 ENCOUNTER — Encounter: Payer: Self-pay | Admitting: Neurology

## 2017-05-04 VITALS — BP 118/70 | HR 73

## 2017-05-04 DIAGNOSIS — G43711 Chronic migraine without aura, intractable, with status migrainosus: Secondary | ICD-10-CM

## 2017-05-04 MED ORDER — ZONISAMIDE 100 MG PO CAPS: ORAL_CAPSULE | ORAL | 3 refills | Status: DC

## 2017-05-04 MED ORDER — ONABOTULINUMTOXINA 100 UNITS IJ SOLR
200.0000 [IU] | Freq: Once | INTRAMUSCULAR | Status: AC
  Administered 2017-05-04: 200 [IU] via INTRAMUSCULAR

## 2017-05-04 NOTE — Progress Notes (Signed)
Please refer to Botox procedure note. 

## 2017-05-04 NOTE — Procedures (Signed)
     BOTOX PROCEDURE NOTE FOR MIGRAINE HEADACHE   HISTORY: [name] is a 51 year old patient with a history of intractable migraine headache. The patient comes in for a Botox injection, she does not believe that the treatments are extremely helpful for her migraines. She might get only a few weeks of approved headache frequency and severity, otherwise there has been little difference in the migraine pattern following the Botox injections.   Description of procedure:  The patient was placed in a sitting position. The standard protocol was used for Botox as follows, with 5 units of Botox injected at each site:   -Procerus muscle, midline injection  -Corrugator muscle, bilateral injection  -Frontalis muscle, bilateral injection, with 2 sites each side, medial injection was performed in the upper one third of the frontalis muscle, in the region vertical from the medial inferior edge of the superior orbital rim. The lateral injection was again in the upper one third of the forehead vertically above the lateral limbus of the cornea, 1.5 cm lateral to the medial injection site.  -Temporalis muscle injection, 4 sites, bilaterally. The first injection was 3 cm above the tragus of the ear, second injection site was 1.5 cm to 3 cm up from the first injection site in line with the tragus of the ear. The third injection site was 1.5-3 cm forward between the first 2 injection sites. The fourth injection site was 1.5 cm posterior to the second injection site.  -Occipitalis muscle injection, 3 sites, bilaterally. The first injection was done one half way between the occipital protuberance and the tip of the mastoid process behind the ear. The second injection site was done lateral and superior to the first, 1 fingerbreadth from the first injection. The third injection site was 1 fingerbreadth superiorly and medially from the first injection site.  -Cervical paraspinal muscle injection, 2 sites,  bilateral, the first injection site was 1 cm from the midline of the cervical spine, 3 cm inferior to the lower border of the occipital protuberance. The second injection site was 1.5 cm superiorly and laterally to the first injection site.  -Trapezius muscle injection was performed at 3 sites, bilaterally. The first injection site was in the upper trapezius muscle halfway between the inflection point of the neck, and the acromion. The second injection site was one half way between the acromion and the first injection site. The third injection was done between the first injection site and the inflection point of the neck.   A 200 unit bottle of Botox was used, 155 units were injected, the rest of the Botox was wasted. The patient tolerated the procedure well, there were no complications of the above procedure.  Botox NDC [id number] Lot number [id number] Expiration date December 2020

## 2017-05-05 ENCOUNTER — Ambulatory Visit (INDEPENDENT_AMBULATORY_CARE_PROVIDER_SITE_OTHER): Payer: BLUE CROSS/BLUE SHIELD | Admitting: Family Medicine

## 2017-05-05 ENCOUNTER — Encounter: Payer: Self-pay | Admitting: Family Medicine

## 2017-05-05 VITALS — BP 101/73 | HR 72 | Temp 97.9°F | Ht 61.0 in | Wt 112.0 lb

## 2017-05-05 DIAGNOSIS — F411 Generalized anxiety disorder: Secondary | ICD-10-CM | POA: Diagnosis not present

## 2017-05-05 DIAGNOSIS — G4700 Insomnia, unspecified: Secondary | ICD-10-CM

## 2017-05-05 DIAGNOSIS — I10 Essential (primary) hypertension: Secondary | ICD-10-CM | POA: Diagnosis not present

## 2017-05-05 DIAGNOSIS — F324 Major depressive disorder, single episode, in partial remission: Secondary | ICD-10-CM

## 2017-05-05 MED ORDER — ZOLPIDEM TARTRATE 10 MG PO TABS
10.0000 mg | ORAL_TABLET | Freq: Every evening | ORAL | 5 refills | Status: DC | PRN
Start: 2017-05-05 — End: 2017-10-27

## 2017-05-05 NOTE — Progress Notes (Signed)
   Subjective:    Patient ID: Blondell RevealMelinda S Balis, female    DOB: [birth year], 51 y.o.   MRN: [id number]  HPI Here with her husband to follow up. She seems to be doing well. She has completely stopped taking Metoprolol because her BP has been coming down. She has gained a little weight, which is a good thing. Her depression has been stable on a combination of JordanLatuda and Trazedone. Ambien still helps with sleep.    Review of Systems  Constitutional: Negative.   Respiratory: Negative.   Cardiovascular: Negative.   Neurological: Negative.   Psychiatric/Behavioral: Negative.        Objective:   Physical Exam  Constitutional: She is oriented to person, place, and time. She appears well-developed and well-nourished.  Cardiovascular: Normal rate, regular rhythm, normal heart sounds and intact distal pulses.   Pulmonary/Chest: Effort normal and breath sounds normal. No respiratory distress. She has no wheezes. She has no rales.  Neurological: She is alert and oriented to person, place, and time.  Psychiatric: She has a normal mood and affect. Her behavior is normal. Thought content normal.          Assessment & Plan:  Her HTN is stable off meds. I recommended she exercise regularly. We will refill the Ambien for sleep. Her depression is stable under the care of her psychiatric NP.  Gershon CraneStephen Fry, MD

## 2017-05-05 NOTE — Patient Instructions (Signed)
WE NOW OFFER   [name]'s FAST TRACK!!!  SAME DAY Appointments for ACUTE CARE  Such as: Sprains, Injuries, cuts, abrasions, rashes, muscle pain, joint pain, back pain Colds, flu, sore throats, headache, allergies, cough, fever  Ear pain, sinus and eye infections Abdominal pain, nausea, vomiting, diarrhea, upset stomach Animal/insect bites  3 Easy Ways to Schedule: Walk-In Scheduling Call in scheduling Mychart Sign-up: [email or web address]         

## 2017-05-18 ENCOUNTER — Ambulatory Visit
Admission: RE | Admit: 2017-05-18 | Discharge: 2017-05-18 | Disposition: A | Discharge status: Home / Self Care | Payer: BLUE CROSS/BLUE SHIELD | Source: Ambulatory Visit | Attending: Family Medicine | Admitting: Family Medicine

## 2017-05-18 DIAGNOSIS — Z1231 Encounter for screening mammogram for malignant neoplasm of breast: Secondary | ICD-10-CM | POA: Diagnosis not present

## 2017-06-01 ENCOUNTER — Telehealth: Payer: Self-pay | Admitting: Neurology

## 2017-06-01 ENCOUNTER — Other Ambulatory Visit: Payer: Self-pay

## 2017-06-01 DIAGNOSIS — G43701 Chronic migraine without aura, not intractable, with status migrainosus: Secondary | ICD-10-CM

## 2017-06-01 MED ORDER — HYDROCODONE-ACETAMINOPHEN 5-325 MG PO TABS: 1.0000 | ORAL_TABLET | Freq: Four times a day (QID) | ORAL | 0 refills | Status: DC | PRN

## 2017-06-01 NOTE — Telephone Encounter (Signed)
Rx sign and left at front desk for patient pick up.

## 2017-06-01 NOTE — Telephone Encounter (Signed)
Rx printed, sent to work in am Dr. Vickey Hugerohmeier for review and signature. Dr Anne Hahnwillis out of office.

## 2017-06-01 NOTE — Telephone Encounter (Signed)
Patient called office requesting refill for HYDROcodone-acetaminophen (NORCO/VICODIN) 5-325 MG tablet.  Patient advised office closes at noon Friday.

## 2017-06-05 ENCOUNTER — Other Ambulatory Visit: Payer: Self-pay | Admitting: Family Medicine

## 2017-06-26 DIAGNOSIS — F250 Schizoaffective disorder, bipolar type: Secondary | ICD-10-CM | POA: Diagnosis not present

## 2017-06-29 ENCOUNTER — Telehealth: Payer: Self-pay | Admitting: Neurology

## 2017-06-29 DIAGNOSIS — G43701 Chronic migraine without aura, not intractable, with status migrainosus: Secondary | ICD-10-CM

## 2017-06-29 MED ORDER — HYDROCODONE-ACETAMINOPHEN 5-325 MG PO TABS: 1.0000 | ORAL_TABLET | Freq: Four times a day (QID) | ORAL | 0 refills | Status: DC | PRN

## 2017-06-29 NOTE — Telephone Encounter (Signed)
Pt calling for refill of HYDROcodone-acetaminophen (NORCO/VICODIN) 5-325 MG tablet °

## 2017-06-29 NOTE — Telephone Encounter (Signed)
.  Placed printed/signed rx hydrocodone up front for patient pick up.  

## 2017-06-29 NOTE — Telephone Encounter (Signed)
The hydrocodone will be refilled. 

## 2017-06-29 NOTE — Addendum Note (Signed)
Addended by: York SpanielWILLIS, CHARLES K on: 06/29/2017 01:11 PM   Modules accepted: Orders

## 2017-07-07 ENCOUNTER — Telehealth: Payer: Self-pay | Admitting: *Deleted

## 2017-07-07 ENCOUNTER — Encounter: Payer: Self-pay | Admitting: Neurology

## 2017-07-07 ENCOUNTER — Encounter (INDEPENDENT_AMBULATORY_CARE_PROVIDER_SITE_OTHER): Payer: Self-pay

## 2017-07-07 ENCOUNTER — Ambulatory Visit (INDEPENDENT_AMBULATORY_CARE_PROVIDER_SITE_OTHER): Payer: BLUE CROSS/BLUE SHIELD | Admitting: Neurology

## 2017-07-07 VITALS — BP 90/58 | HR 84 | Wt 109.5 lb

## 2017-07-07 DIAGNOSIS — R42 Dizziness and giddiness: Secondary | ICD-10-CM | POA: Insufficient documentation

## 2017-07-07 DIAGNOSIS — R55 Syncope and collapse: Secondary | ICD-10-CM

## 2017-07-07 DIAGNOSIS — G43019 Migraine without aura, intractable, without status migrainosus: Secondary | ICD-10-CM | POA: Diagnosis not present

## 2017-07-07 DIAGNOSIS — R569 Unspecified convulsions: Secondary | ICD-10-CM | POA: Diagnosis not present

## 2017-07-07 MED ORDER — PREGABALIN 150 MG PO CAPS: 150.0000 mg | ORAL_CAPSULE | Freq: Two times a day (BID) | ORAL | 3 refills | Status: DC

## 2017-07-07 NOTE — Patient Instructions (Signed)
We will try Aimovig for the headache. 

## 2017-07-07 NOTE — Progress Notes (Signed)
Faxed printed[facility]signed [facility] to [facility] at [phone]. Received confirmation.

## 2017-07-07 NOTE — Telephone Encounter (Signed)
Faxed completed/signed aimovig service request form and prescription to [facility]. Fax: [phone]. Received confirmation.   

## 2017-07-07 NOTE — Progress Notes (Signed)
Reason for visit: Headache, history of seizures  [name] is an 51 y.o. female  History of present illness:  [name] is a 51 year old right-handed white female with a history of intractable migraine headaches. The patient continues to have daily headaches, a trial on Botox was not helpful. We have stopped the medication. The patient remains on Zonegran and Lyrica for the seizures and headache without benefit. She has had chronic insomnia, but this has improved with addition of Latuda and trazodone in the evening hours, but the patient is now having episodes of near-syncope and dizziness when she sits up in the morning. This may last about 30 minutes and then clear, she does better during the rest of the day. The patient has lost 14 pounds of weight over the last 18 months. She is not eating well. She is not sure that she is drinking enough fluids. The patient has dimming of vision with the episodes of dizziness but she has not yet blacked out. She feels better when she lies back down. She believes that her depression issues have been under good control, but she has been under some stress as her mother died in March 2018.  Past Medical History:  Diagnosis Date  . Anxiety   . Arthritis   . Chronic constipation 01/10/2014  . Depression    sees Deatra RobinsonKaren Jones NP   . Epilepsy (HCC)    seizures, sees Dr. Anne HahnWillis  . Gallstones   . Headache(784.0)    migraine  . Headache, common migraine, intractable 09/09/2015  . Hemorrhoids   . Hypertension   . Insomnia   . Obsessive compulsive disorder   . Suicidal ideation     Past Surgical History:  Procedure Laterality Date  . ABDOMINAL HYSTERECTOMY  2001  . APPENDECTOMY Right    with ovarian cyst removal  . BREAST EXCISIONAL BIOPSY     left 2008  . BREAST LUMPECTOMY Left 01/2007   Dr. Abbey Chattersosenbower  . CESAREAN SECTION    . CHOLECYSTECTOMY    . HEMORRHOID BANDING  2015  . TONSILLECTOMY      Family History  Problem Relation Age of  Onset  . Cirrhosis Mother   . Stomach cancer Mother   . Heart disease Mother   . Heart disease Father   . Hypertension Father   . Thyroid cancer Brother   . HIV Brother   . Pancreatic cancer Maternal Uncle     Social history:  reports that she has never smoked. She has never used smokeless tobacco. She reports that she drinks alcohol. She reports that she does not use drugs.    Allergies  Allergen Reactions  . Vimpat [Lacosamide]     Increased depression    Medications:  Prior to Admission medications   Medication Sig Start Date End Date Taking? Authorizing Provider  HYDROcodone-acetaminophen (NORCO/VICODIN) 5-325 MG tablet Take 1 tablet by mouth every 6 (six) hours as needed. Must last 28 days 06/29/17  Yes York SpanielWillis, Mehreen Azizi K, MD  lurasidone (LATUDA) 80 MG TABS tablet Take 80 mg by mouth at bedtime.   Yes [provider]  pregabalin (LYRICA) 150 MG capsule Take 1 capsule (150 mg total) by mouth 2 (two) times daily. 01/25/17  Yes York SpanielWillis, Tahsin Benyo K, MD  PREMARIN 0.625 MG tablet TAKE 1 TABLET BY MOUTH EVERY DAY FOR 21 DAYS THEN DO NOT TAKE FOR 7 DAYS 06/06/17  Yes Nelwyn SalisburyFry, Stephen A, MD  trazodone (DESYREL) 300 MG tablet Take 300 mg by mouth at  bedtime.   Yes [provider]  zolpidem (AMBIEN) 10 MG tablet Take 1 tablet (10 mg total) by mouth at bedtime as needed. 05/05/17  Yes Nelwyn SalisburyFry, Stephen A, MD  zonisamide (ZONEGRAN) 100 MG capsule TAKE 3 CAPSULES BY MOUTH 2 TIMES A DAY 05/04/17  Yes York SpanielWillis, Javonne Louissaint K, MD    ROS:  Out of a complete 14 system review of symptoms, the patient complains only of the following symptoms, and all other reviewed systems are negative.  Headache Insomnia  Blood pressure (!) 90/58, pulse 84, weight 109 lb 8 oz (49.7 kg).  Physical Exam  General: The patient is alert and cooperative at the time of the examination. The patient has a flat affect.  Skin: No significant peripheral edema is noted.   Neurologic Exam  Mental status: The patient is  alert and oriented x 3 at the time of the examination. The patient has apparent normal recent and remote memory, with an apparently normal attention span and concentration ability.   Cranial nerves: Facial symmetry is present. Speech is normal, no aphasia or dysarthria is noted. Extraocular movements are full. Visual fields are full.  Motor: The patient has good strength in all 4 extremities.  Sensory examination: Soft touch sensation is symmetric on the face, arms, and legs.  Coordination: The patient has good finger-nose-finger and heel-to-shin bilaterally.  Gait and station: The patient has a normal gait. Tandem gait is normal. Romberg is negative. No drift is seen.  Reflexes: Deep tendon reflexes are symmetric.   Assessment/Plan:  1. Intractable migraine headache  2. Chronic insomnia  3. History of seizures, well controlled  4. Severe organic depression  5. Dizziness, near syncope  The patient is to liberalize salt in the diet, and increase fluid intake during the day and evening. The patient is off of Botox now. We will apply for a trial on Aimovig to see if this may help her headaches. The patient will follow-up in 6 months.   Marlan Palau. Keith Jarrod Bodkins MD 07/07/2017 8:03 AM  [facility] [address] [place], [place] [id number]  Phone [phone] Fax [phone]

## 2017-07-07 NOTE — Telephone Encounter (Signed)
Received [facility] from [employer] [facility] [facility] confirming they receive enrollment form.

## 2017-07-07 NOTE — Telephone Encounter (Signed)
Error/ Duplicate

## 2017-07-31 ENCOUNTER — Telehealth: Payer: Self-pay | Admitting: Neurology

## 2017-07-31 DIAGNOSIS — G43701 Chronic migraine without aura, not intractable, with status migrainosus: Secondary | ICD-10-CM

## 2017-07-31 MED ORDER — HYDROCODONE-ACETAMINOPHEN 5-325 MG PO TABS: 1.0000 | ORAL_TABLET | Freq: Two times a day (BID) | ORAL | 0 refills | Status: DC

## 2017-07-31 NOTE — Addendum Note (Signed)
Addended by: Melvyn Novas on: 07/31/2017 09:20 AM   Modules accepted: Orders

## 2017-07-31 NOTE — Telephone Encounter (Signed)
Placed printed/signed rx up front for patient pick up.  

## 2017-07-31 NOTE — Telephone Encounter (Signed)
Rx printed, awaiting MD signature.  

## 2017-07-31 NOTE — Telephone Encounter (Signed)
Patient requesting refill of HYDROcodone-acetaminophen (NORCO/VICODIN) 5-325 MG tablet. I advised Dr. Anne Hahn is out of the office and will send to his nurse.

## 2017-07-31 NOTE — Telephone Encounter (Signed)
Refilled for the time Dr Anne Hahn is out. No refill.

## 2017-08-01 ENCOUNTER — Other Ambulatory Visit: Payer: Self-pay | Admitting: Neurology

## 2017-08-01 ENCOUNTER — Telehealth: Payer: Self-pay | Admitting: Neurology

## 2017-08-01 DIAGNOSIS — G43909 Migraine, unspecified, not intractable, without status migrainosus: Secondary | ICD-10-CM

## 2017-08-01 MED ORDER — HYDROCODONE-ACETAMINOPHEN 5-325 MG PO TABS: 1.0000 | ORAL_TABLET | Freq: Four times a day (QID) | ORAL | Status: DC | PRN

## 2017-08-01 MED ORDER — HYDROCODONE-ACETAMINOPHEN 5-325 MG PO TABS: 1.0000 | ORAL_TABLET | Freq: Four times a day (QID) | ORAL | 0 refills | Status: DC | PRN

## 2017-08-01 NOTE — Telephone Encounter (Signed)
Called and spoke with the patient in regards to refilling Hydrocodone in the absence of Dr Anne Hahn. Pt was agreeale to a week supply until Dr Anne Hahn was back In town. Script ready and signed in upfront

## 2017-08-08 ENCOUNTER — Telehealth: Payer: Self-pay | Admitting: Neurology

## 2017-08-08 MED ORDER — HYDROCODONE-ACETAMINOPHEN 5-325 MG PO TABS: 1.0000 | ORAL_TABLET | Freq: Four times a day (QID) | ORAL | 0 refills | Status: DC | PRN

## 2017-08-08 NOTE — Telephone Encounter (Signed)
Patient called and requested a refill for rx Hydrocodone. Please call and advise.

## 2017-08-08 NOTE — Telephone Encounter (Signed)
A prescription was sent in for hydrocodone.

## 2017-08-09 NOTE — Telephone Encounter (Signed)
Placed printed/signed rx up front for patient pick up.  

## 2017-08-24 ENCOUNTER — Telehealth: Payer: Self-pay | Admitting: *Deleted

## 2017-08-24 NOTE — Telephone Encounter (Signed)
Completed PA Aimovig on covermymeds. Key: [id number].  "Your information has been submitted to [employer]. [employer] is reviewing the PA request and you will receive an electronic response. You may check for the updated outcome later by reopening this request. The standard fax determination will also be sent to you directly.  If you have any questions about your PA submission, contact [employer] at [phone]." Awaiting response.

## 2017-08-28 ENCOUNTER — Telehealth: Payer: Self-pay | Admitting: Neurology

## 2017-08-28 NOTE — Telephone Encounter (Signed)
I called the patient. The patient does not wish to consider the Aimovig at this time.  She wishes to go back to the Botox therapy.

## 2017-08-28 NOTE — Telephone Encounter (Signed)
Patient called office and would like to start doing Botox injections again.  She does not want to start Aimovig and wants to discuss how to start Botox again.  Please call

## 2017-08-31 ENCOUNTER — Telehealth: Payer: Self-pay | Admitting: Neurology

## 2017-08-31 NOTE — Telephone Encounter (Signed)
The last prescription for hydrocodone was given on 08/08/2017. The next refill is not due until 09/05/2017.

## 2017-08-31 NOTE — Telephone Encounter (Signed)
Pt request refill for HYDROcodone-acetaminophen (NORCO/VICODIN) 5-325 MG tablet. Pt is aware the clinic closes at noon tomorrow °

## 2017-09-01 DIAGNOSIS — G43719 Chronic migraine without aura, intractable, without status migrainosus: Secondary | ICD-10-CM | POA: Diagnosis not present

## 2017-09-06 ENCOUNTER — Ambulatory Visit (INDEPENDENT_AMBULATORY_CARE_PROVIDER_SITE_OTHER): Payer: BLUE CROSS/BLUE SHIELD | Admitting: Neurology

## 2017-09-06 ENCOUNTER — Encounter: Payer: Self-pay | Admitting: Neurology

## 2017-09-06 VITALS — BP 119/70 | HR 70 | Ht 61.0 in

## 2017-09-06 DIAGNOSIS — G43711 Chronic migraine without aura, intractable, with status migrainosus: Secondary | ICD-10-CM

## 2017-09-06 MED ORDER — HYDROCODONE-ACETAMINOPHEN 5-325 MG PO TABS: 1.0000 | ORAL_TABLET | Freq: Four times a day (QID) | ORAL | 0 refills | Status: DC | PRN

## 2017-09-06 NOTE — Procedures (Signed)
     BOTOX PROCEDURE NOTE FOR MIGRAINE HEADACHE   HISTORY: [name] is a 51 year old patient with a history of intractable migraine headaches. She has gained some benefit with Botox, she returns for another Botox therapy. She has tolerated this treatment well.   Description of procedure:  The patient was placed in a sitting position. The standard protocol was used for Botox as follows, with 5 units of Botox injected at each site:   -Procerus muscle, midline injection  -Corrugator muscle, bilateral injection  -Frontalis muscle, bilateral injection, with 2 sites each side, medial injection was performed in the upper one third of the frontalis muscle, in the region vertical from the medial inferior edge of the superior orbital rim. The lateral injection was again in the upper one third of the forehead vertically above the lateral limbus of the cornea, 1.5 cm lateral to the medial injection site.  -Temporalis muscle injection, 4 sites, bilaterally. The first injection was 3 cm above the tragus of the ear, second injection site was 1.5 cm to 3 cm up from the first injection site in line with the tragus of the ear. The third injection site was 1.5-3 cm forward between the first 2 injection sites. The fourth injection site was 1.5 cm posterior to the second injection site.  -Occipitalis muscle injection, 3 sites, bilaterally. The first injection was done one half way between the occipital protuberance and the tip of the mastoid process behind the ear. The second injection site was done lateral and superior to the first, 1 fingerbreadth from the first injection. The third injection site was 1 fingerbreadth superiorly and medially from the first injection site.  -Cervical paraspinal muscle injection, 2 sites, bilateral, the first injection site was 1 cm from the midline of the cervical spine, 3 cm inferior to the lower border of the occipital protuberance. The second injection site was 1.5 cm  superiorly and laterally to the first injection site.  -Trapezius muscle injection was performed at 3 sites, bilaterally. The first injection site was in the upper trapezius muscle halfway between the inflection point of the neck, and the acromion. The second injection site was one half way between the acromion and the first injection site. The third injection was done between the first injection site and the inflection point of the neck.   A 200 unit bottle of Botox was used, 155 units were injected, the rest of the Botox was wasted. The patient tolerated the procedure well, there were no complications of the above procedure.  Botox NDC 1610-9604-54 Lot number [id number] Expiration date 03/2020

## 2017-09-06 NOTE — Progress Notes (Signed)
Please refer to Botox procedure note. 

## 2017-09-08 ENCOUNTER — Other Ambulatory Visit: Payer: Self-pay | Admitting: Neurology

## 2017-09-08 MED ORDER — ERENUMAB-AOOE 70 MG/ML ~~LOC~~ SOAJ: 140.0000 mg | SUBCUTANEOUS | 3 refills | Status: DC

## 2017-09-11 ENCOUNTER — Telehealth: Payer: Self-pay | Admitting: *Deleted

## 2017-09-11 NOTE — Telephone Encounter (Signed)
[name] Advised we received PA request for Aimovig but asked her to d/c rx. Pt decided to go with different therapy. She verbalized understanding and cx rx.

## 2017-09-14 NOTE — Telephone Encounter (Signed)
Pharmacy called to confirm the information below was correct. I told them it was.

## 2017-10-03 ENCOUNTER — Telehealth: Payer: Self-pay | Admitting: Neurology

## 2017-10-03 NOTE — Telephone Encounter (Signed)
Patient requesting refill of HYDROcodone-acetaminophen (NORCO/VICODIN) 5-325 MG tablet. ° ° °

## 2017-10-04 MED ORDER — HYDROCODONE-ACETAMINOPHEN 5-325 MG PO TABS: 1.0000 | ORAL_TABLET | Freq: Four times a day (QID) | ORAL | 0 refills | Status: DC | PRN

## 2017-10-04 NOTE — Addendum Note (Signed)
Addended by: York SpanielWILLIS, CHARLES K on: 10/04/2017 07:22 AM   Modules accepted: Orders

## 2017-10-04 NOTE — Telephone Encounter (Signed)
The hydrocodone prescription will be refilled, the [place] registry was checked. 

## 2017-10-04 NOTE — Telephone Encounter (Signed)
Placed printed/signed rx up front for patient pick up.  

## 2017-10-16 DIAGNOSIS — F250 Schizoaffective disorder, bipolar type: Secondary | ICD-10-CM | POA: Diagnosis not present

## 2017-10-27 ENCOUNTER — Other Ambulatory Visit: Payer: Self-pay | Admitting: Family Medicine

## 2017-10-30 NOTE — Telephone Encounter (Signed)
Sent to PCP for approval. Rx was last refilled 05/05/2017 disp 30 with 5 refills. Last OV 05/05/2017.

## 2017-10-30 NOTE — Telephone Encounter (Signed)
Call in #30 with 5 rf 

## 2017-10-30 NOTE — Telephone Encounter (Signed)
Copied from CRM [id number]. Topic: Quick Communication - Rx Refill/Question >> Oct 30, 2017  1:51 PM Percival SpanishKennedy, Cheryl W wrote: Has the patient contacted their pharmacy? {yes    (Agent: If no, request that the patient contact the pharmacy for the refill.)   Preferred Pharmacy (with phone number or street name  [facility] [address]      Agent: Please be advised that RX refills may take up to 48 hours. We ask that you follow-up with your pharmacy.

## 2017-10-30 NOTE — Telephone Encounter (Signed)
Ambien refill 

## 2017-10-31 ENCOUNTER — Telehealth: Payer: Self-pay | Admitting: Neurology

## 2017-10-31 NOTE — Telephone Encounter (Signed)
Pt calling for a refill of HYDROcodone-acetaminophen (NORCO/VICODIN) 5-325 MG tablet °

## 2017-10-31 NOTE — Telephone Encounter (Signed)
The hydrocodone will be due on 01 November 2017.

## 2017-11-01 MED ORDER — HYDROCODONE-ACETAMINOPHEN 5-325 MG PO TABS: 1.0000 | ORAL_TABLET | Freq: Four times a day (QID) | ORAL | 0 refills | Status: DC | PRN

## 2017-11-01 NOTE — Telephone Encounter (Signed)
Placed printed/signed rx up front for patient pick up.  

## 2017-11-01 NOTE — Addendum Note (Signed)
Addended by: York SpanielWILLIS, CHARLES K on: 11/01/2017 07:29 AM   Modules accepted: Orders

## 2017-11-29 ENCOUNTER — Telehealth: Payer: Self-pay | Admitting: Neurology

## 2017-11-29 MED ORDER — HYDROCODONE-ACETAMINOPHEN 5-325 MG PO TABS: 1.0000 | ORAL_TABLET | Freq: Four times a day (QID) | ORAL | 0 refills | Status: DC | PRN

## 2017-11-29 NOTE — Telephone Encounter (Signed)
The prescription for hydrocodone will be refilled. 

## 2017-11-29 NOTE — Addendum Note (Signed)
Addended by: York SpanielWILLIS, CHARLES K on: 11/29/2017 05:02 PM   Modules accepted: Orders

## 2017-11-29 NOTE — Telephone Encounter (Signed)
Pt calling for a refill of HYDROcodone-acetaminophen (NORCO/VICODIN) 5-325 MG tablet °

## 2017-11-30 NOTE — Telephone Encounter (Signed)
Rx signed by Dr. Willis and placed up front for pick up. 

## 2017-12-13 ENCOUNTER — Encounter: Payer: Self-pay | Admitting: Neurology

## 2017-12-13 ENCOUNTER — Ambulatory Visit: Payer: BLUE CROSS/BLUE SHIELD | Admitting: Neurology

## 2017-12-13 VITALS — BP 121/79 | HR 77 | Ht 61.0 in | Wt 111.0 lb

## 2017-12-13 DIAGNOSIS — G43019 Migraine without aura, intractable, without status migrainosus: Secondary | ICD-10-CM | POA: Diagnosis not present

## 2017-12-13 NOTE — Procedures (Signed)
     BOTOX PROCEDURE NOTE FOR MIGRAINE HEADACHE   HISTORY: [name] is a 52 year old patient with a history of intractable migraine headache.  She returns for a Botox therapy.  The Botox has offered good improvement with her headaches, in the past oral medications have offered very little benefit.  The patient is satisfied with her Botox therapies.   Description of procedure:  The patient was placed in a sitting position. The standard protocol was used for Botox as follows, with 5 units of Botox injected at each site:   -Procerus muscle, midline injection  -Corrugator muscle, bilateral injection  -Frontalis muscle, bilateral injection, with 2 sites each side, medial injection was performed in the upper one third of the frontalis muscle, in the region vertical from the medial inferior edge of the superior orbital rim. The lateral injection was again in the upper one third of the forehead vertically above the lateral limbus of the cornea, 1.5 cm lateral to the medial injection site.  -Temporalis muscle injection, 4 sites, bilaterally. The first injection was 3 cm above the tragus of the ear, second injection site was 1.5 cm to 3 cm up from the first injection site in line with the tragus of the ear. The third injection site was 1.5-3 cm forward between the first 2 injection sites. The fourth injection site was 1.5 cm posterior to the second injection site.  -Occipitalis muscle injection, 3 sites, bilaterally. The first injection was done one half way between the occipital protuberance and the tip of the mastoid process behind the ear. The second injection site was done lateral and superior to the first, 1 fingerbreadth from the first injection. The third injection site was 1 fingerbreadth superiorly and medially from the first injection site.  -Cervical paraspinal muscle injection, 2 sites, bilateral, the first injection site was 1 cm from the midline of the cervical spine, 3 cm inferior  to the lower border of the occipital protuberance. The second injection site was 1.5 cm superiorly and laterally to the first injection site.  -Trapezius muscle injection was performed at 3 sites, bilaterally. The first injection site was in the upper trapezius muscle halfway between the inflection point of the neck, and the acromion. The second injection site was one half way between the acromion and the first injection site. The third injection was done between the first injection site and the inflection point of the neck.   A 200 unit bottle of Botox was used, 155 units were injected, the rest of the Botox was wasted. The patient tolerated the procedure well, there were no complications of the above procedure.  Botox NDC [id number] Lot number [id number] Expiration date July 2021

## 2017-12-13 NOTE — Progress Notes (Signed)
Please refer to Botox procedure note. 

## 2017-12-27 ENCOUNTER — Telehealth: Payer: Self-pay | Admitting: Neurology

## 2017-12-27 MED ORDER — HYDROCODONE-ACETAMINOPHEN 5-325 MG PO TABS: 1.0000 | ORAL_TABLET | Freq: Four times a day (QID) | ORAL | 0 refills | Status: DC | PRN

## 2017-12-27 NOTE — Telephone Encounter (Signed)
Patient requesting refill of HYDROcodone-acetaminophen (NORCO/VICODIN) 5-325 MG tablet. ° ° °

## 2017-12-27 NOTE — Telephone Encounter (Signed)
Placed printed/signed rx up front for patient pick up.  

## 2017-12-27 NOTE — Addendum Note (Signed)
Addended by: York SpanielWILLIS, CHARLES K on: 12/27/2017 11:02 AM   Modules accepted: Orders

## 2017-12-27 NOTE — Telephone Encounter (Signed)
The hydrocodone prescription will be refilled. 

## 2018-01-15 ENCOUNTER — Encounter: Payer: Self-pay | Admitting: Neurology

## 2018-01-15 ENCOUNTER — Ambulatory Visit: Payer: BLUE CROSS/BLUE SHIELD | Admitting: Neurology

## 2018-01-15 VITALS — BP 125/76 | HR 73 | Ht 61.0 in | Wt 113.0 lb

## 2018-01-15 DIAGNOSIS — Z79891 Long term (current) use of opiate analgesic: Secondary | ICD-10-CM | POA: Diagnosis not present

## 2018-01-15 DIAGNOSIS — G43019 Migraine without aura, intractable, without status migrainosus: Secondary | ICD-10-CM | POA: Diagnosis not present

## 2018-01-15 DIAGNOSIS — F250 Schizoaffective disorder, bipolar type: Secondary | ICD-10-CM | POA: Diagnosis not present

## 2018-01-15 NOTE — Progress Notes (Signed)
Reason for visit: Intractable migraine headache  [name] is an 52 y.o. female  History of present illness:  [name] is a 52 year old right-handed white female with a history of intractable migraine headaches.  The patient has headaches daily in nature, 4 or 5 days of the month the headaches are quite severe and the patient is incapacitated.  She gets Botox injections that offered some benefit, but did not completely eliminate her headache.  She was given a trial on Aimovig that did not help her.  She returns for routine reevaluation at this time.  The patient does take hydrocodone on a regular basis.  She sleeps 5 or 6 hours at night with sleeping pills, she still feels fatigued during the day.  The patient indicates that her father was just diagnosed with cancer with lesions on the liver.  She returns to this office for an evaluation.  Past Medical History:  Diagnosis Date  . Anxiety   . Arthritis   . Chronic constipation 01/10/2014  . Depression    sees Deatra RobinsonKaren Jones NP   . Epilepsy (HCC)    seizures, sees Dr. Anne HahnWillis  . Gallstones   . Headache(784.0)    migraine  . Headache, common migraine, intractable 09/09/2015  . Hemorrhoids   . Hypertension   . Insomnia   . Obsessive compulsive disorder   . Suicidal ideation     Past Surgical History:  Procedure Laterality Date  . ABDOMINAL HYSTERECTOMY  2001  . APPENDECTOMY Right    with ovarian cyst removal  . BREAST EXCISIONAL BIOPSY     left 2008  . BREAST LUMPECTOMY Left 01/2007   Dr. Abbey Chattersosenbower  . CESAREAN SECTION    . CHOLECYSTECTOMY    . HEMORRHOID BANDING  2015  . TONSILLECTOMY      Family History  Problem Relation Age of Onset  . Cirrhosis Mother   . Stomach cancer Mother   . Heart disease Mother   . Heart disease Father   . Hypertension Father   . Thyroid cancer Brother   . HIV Brother   . Pancreatic cancer Maternal Uncle     Social history:  reports that  has never smoked. she has never used  smokeless tobacco. She reports that she drinks alcohol. She reports that she does not use drugs.    Allergies  Allergen Reactions  . Vimpat [Lacosamide]     Increased depression    Medications:  Prior to Admission medications   Medication Sig Start Date End Date Taking? Authorizing Provider  HYDROcodone-acetaminophen (NORCO/VICODIN) 5-325 MG tablet Take 1 tablet by mouth every 6 (six) hours as needed for moderate pain. 12/27/17  Yes York SpanielWillis, Charles K, MD  lurasidone (LATUDA) 80 MG TABS tablet Take 80 mg by mouth at bedtime.   Yes [provider]  pregabalin (LYRICA) 150 MG capsule Take 1 capsule (150 mg total) by mouth 2 (two) times daily. 07/07/17  Yes York SpanielWillis, Charles K, MD  PREMARIN 0.625 MG tablet TAKE 1 TABLET BY MOUTH EVERY DAY FOR 21 DAYS THEN DO NOT TAKE FOR 7 DAYS 06/06/17  Yes Nelwyn SalisburyFry, Stephen A, MD  trazodone (DESYREL) 300 MG tablet Take 300 mg by mouth at bedtime.   Yes [provider]  zolpidem (AMBIEN) 10 MG tablet TAKE 1 TABLET BY MOUTH AT BEDTIME AS NEEDED 10/30/17  Yes Nelwyn SalisburyFry, Stephen A, MD  zonisamide (ZONEGRAN) 100 MG capsule TAKE 3 CAPSULES BY MOUTH 2 TIMES A DAY 05/04/17  Yes York SpanielWillis, Charles K, MD  ROS:  Out of a complete 14 system review of symptoms, the patient complains only of the following symptoms, and all other reviewed systems are negative.  Ringing in the ears Blurred vision Headache, depression  Blood pressure 125/76, pulse 73, height 5\' 1"  (1.549 m), weight 113 lb (51.3 kg).  Physical Exam  General: The patient is alert and cooperative at the time of the examination.  Skin: No significant peripheral edema is noted.   Neurologic Exam  Mental status: The patient is alert and oriented x 3 at the time of the examination. The patient has apparent normal recent and remote memory, with an apparently normal attention span and concentration ability.   Cranial nerves: Facial symmetry is present. Speech is normal, no aphasia or dysarthria is  noted. Extraocular movements are full. Visual fields are full.  Motor: The patient has good strength in all 4 extremities.  Sensory examination: Soft touch sensation is symmetric on the face, arms, and legs.  Coordination: The patient has good finger-nose-finger and heel-to-shin bilaterally.  Gait and station: The patient has a normal gait. Tandem gait is normal. Romberg is negative. No drift is seen.  Reflexes: Deep tendon reflexes are symmetric.   Assessment/Plan:  1.  Intractable migraine  2.  Chronic depression  The patient will continue the Botox therapies.  She will have a urine drug screen done today.  She will follow-up in 6 months.  Marlan Palau MD 01/15/2018 2:55 PM  [facility] [address] [place], [place] [address]  Phone [phone] Fax [phone]

## 2018-01-22 LAB — COMPLIANCE DRUG ANALYSIS, UR

## 2018-01-24 ENCOUNTER — Telehealth: Payer: Self-pay | Admitting: Neurology

## 2018-01-24 MED ORDER — HYDROCODONE-ACETAMINOPHEN 5-325 MG PO TABS: 1.0000 | ORAL_TABLET | Freq: Four times a day (QID) | ORAL | 0 refills | Status: DC | PRN

## 2018-01-24 NOTE — Telephone Encounter (Signed)
Placed printed/signed rx up front for patient pick up.  

## 2018-01-24 NOTE — Telephone Encounter (Signed)
Patient requesting refill of HYDROcodone-acetaminophen (NORCO/VICODIN) 5-325 MG tablet. ° ° °

## 2018-01-24 NOTE — Addendum Note (Signed)
Addended by: York SpanielWILLIS, Garold Sheeler K on: 01/24/2018 04:39 PM   Modules accepted: Orders

## 2018-01-24 NOTE — Telephone Encounter (Signed)
[facility], the prescription for hydrocodone will be written.

## 2018-02-19 DIAGNOSIS — H5203 Hypermetropia, bilateral: Secondary | ICD-10-CM | POA: Diagnosis not present

## 2018-02-20 ENCOUNTER — Other Ambulatory Visit: Payer: Self-pay | Admitting: Neurology

## 2018-02-20 NOTE — Telephone Encounter (Signed)
Pt requesting a refill for HYDROcodone-acetaminophen (NORCO/VICODIN) 5-[facility] Pt aware Dr. Anne HahnWillis is now able to e-scribe medication directly to the pharmacy and does not have to pick up a printed RX

## 2018-02-21 MED ORDER — HYDROCODONE-ACETAMINOPHEN 5-325 MG PO TABS: 1.0000 | ORAL_TABLET | Freq: Four times a day (QID) | ORAL | 0 refills | Status: DC | PRN

## 2018-02-22 ENCOUNTER — Other Ambulatory Visit: Payer: Self-pay | Admitting: Neurology

## 2018-02-22 NOTE — Telephone Encounter (Signed)
Faxed printed/signed rx Lyrica 150mg  [facility] at [phone]. Received fax confirmation.

## 2018-02-28 ENCOUNTER — Telehealth: Payer: Self-pay | Admitting: Neurology

## 2018-02-28 NOTE — Telephone Encounter (Signed)
I called [facility] to place an order for the patient's botox. The prescription was expired so I called in a new prescription.

## 2018-03-01 NOTE — Telephone Encounter (Signed)
Patients [employer] authorization is [id number] and [phone] expiration (02/28/19).

## 2018-03-07 NOTE — Telephone Encounter (Signed)
I called [facility] to check status of the patients medication. I spoke with Baxter HireKristen, she stated that it was still pending insurance verification.

## 2018-03-14 ENCOUNTER — Telehealth: Payer: Self-pay | Admitting: Neurology

## 2018-03-14 ENCOUNTER — Ambulatory Visit: Payer: BLUE CROSS/BLUE SHIELD | Admitting: Family Medicine

## 2018-03-14 ENCOUNTER — Encounter: Payer: Self-pay | Admitting: Family Medicine

## 2018-03-14 ENCOUNTER — Telehealth: Payer: Self-pay | Admitting: *Deleted

## 2018-03-14 ENCOUNTER — Ambulatory Visit: Payer: BLUE CROSS/BLUE SHIELD | Admitting: Neurology

## 2018-03-14 ENCOUNTER — Encounter: Payer: Self-pay | Admitting: Neurology

## 2018-03-14 VITALS — BP 123/74 | HR 73

## 2018-03-14 VITALS — BP 98/60 | HR 76 | Temp 98.3°F | Ht 61.0 in | Wt 113.6 lb

## 2018-03-14 DIAGNOSIS — G43711 Chronic migraine without aura, intractable, with status migrainosus: Secondary | ICD-10-CM | POA: Diagnosis not present

## 2018-03-14 DIAGNOSIS — R569 Unspecified convulsions: Secondary | ICD-10-CM

## 2018-03-14 DIAGNOSIS — T753XXA Motion sickness, initial encounter: Secondary | ICD-10-CM | POA: Diagnosis not present

## 2018-03-14 MED ORDER — SCOPOLAMINE 1 MG/3DAYS TD PT72: 1.0000 | MEDICATED_PATCH | TRANSDERMAL | 0 refills | Status: DC

## 2018-03-14 MED ORDER — ONABOTULINUMTOXINA 100 UNITS IJ SOLR
200.0000 [IU] | Freq: Once | INTRAMUSCULAR | Status: AC
  Administered 2018-03-14: 200 [IU] via INTRAMUSCULAR

## 2018-03-14 MED ORDER — BRIVARACETAM 50 MG PO TABS: 50.0000 mg | ORAL_TABLET | Freq: Two times a day (BID) | ORAL | 3 refills | Status: DC

## 2018-03-14 NOTE — Progress Notes (Signed)
The patient comes in today for a Botox injection.  She claims that 5 days ago she had 2 seizure events.  The patient bumped her head and otherwise did not have any significant injury.  The patient indicates that she had not missed any doses of her medications, she was not sick or ill.  The patient will be placed on briviact.

## 2018-03-14 NOTE — Telephone Encounter (Signed)
I called the patient.  The patient has had 2 recent seizures, initially we are going to try to get her on Vimpat but in the past she has had increased depression on this.  She has not tolerated Keppra previously, she has not wanted to go on Depakote.  She has been on Lamictal but did not do well on this medication.  She is already on Zonegran, would not use Topamax therefore.  The patient reports that her seizures are generalized in nature, she has no warning.

## 2018-03-14 NOTE — Progress Notes (Signed)
   Subjective:    Patient ID: [name], female    DOB: 01-15-1966, 52 y.o.   MRN: [id number]  HPI Here to discuss motion sickness medications. She and her [place] will be leaving [place] to the [place] in [place]. She has a hx of sea sickness.    Review of Systems  Constitutional: Negative.   Respiratory: Negative.   Cardiovascular: Negative.   Neurological: Positive for headaches. Negative for dizziness.       Objective:   Physical Exam  Constitutional: She is oriented to person, place, and time. She appears well-developed and well-nourished.  Cardiovascular: Normal rate, regular rhythm, normal heart sounds and intact distal pulses.  Pulmonary/Chest: Effort normal and breath sounds normal. No respiratory distress. She has no wheezes. She has no rales.  Neurological: She is alert and oriented to person, place, and time.          Assessment & Plan:  Sea sickness. She will use transdermal scopolamine patches.  Gershon CraneStephen Shir Bergman, MD

## 2018-03-14 NOTE — Telephone Encounter (Signed)
Noted, submitted PA for Briviact. Waiting on determination from insurance.

## 2018-03-14 NOTE — Telephone Encounter (Signed)
Submitted PA Briviact 50mg  tab on covermymeds. Key: [id number]. Waiting on determination.   "Your information has been submitted to [employer] [facility] [employer] [employer] [place]. [employer] [place] will review the request and fax you a determination directly, typically within 3 business days of your submission once all necessary information is received. If [employer] [place] has not responded in 3 business days or if you have any questions about your submission, contact [employer] [place] at [phone]."

## 2018-03-14 NOTE — Procedures (Signed)
     BOTOX PROCEDURE NOTE FOR MIGRAINE HEADACHE   HISTORY: [name] is a 52 year old patient with a history of intractable migraine headaches and history of seizures.  She comes in for her Botox therapy.  She claims that 5 days ago she had 2 seizure events, she had not missed any of her medications that include Lyrica and Zonegran.   Description of procedure:  The patient was placed in a sitting position. The standard protocol was used for Botox as follows, with 5 units of Botox injected at each site:   -Procerus muscle, midline injection  -Corrugator muscle, bilateral injection  -Frontalis muscle, bilateral injection, with 2 sites each side, medial injection was performed in the upper one third of the frontalis muscle, in the region vertical from the medial inferior edge of the superior orbital rim. The lateral injection was again in the upper one third of the forehead vertically above the lateral limbus of the cornea, 1.5 cm lateral to the medial injection site.  -Temporalis muscle injection, 4 sites, bilaterally. The first injection was 3 cm above the tragus of the ear, second injection site was 1.5 cm to 3 cm up from the first injection site in line with the tragus of the ear. The third injection site was 1.5-3 cm forward between the first 2 injection sites. The fourth injection site was 1.5 cm posterior to the second injection site.  -Occipitalis muscle injection, 3 sites, bilaterally. The first injection was done one half way between the occipital protuberance and the tip of the mastoid process behind the ear. The second injection site was done lateral and superior to the first, 1 fingerbreadth from the first injection. The third injection site was 1 fingerbreadth superiorly and medially from the first injection site.  -Cervical paraspinal muscle injection, 2 sites, bilateral, the first injection site was 1 cm from the midline of the cervical spine, 3 cm inferior to the lower  border of the occipital protuberance. The second injection site was 1.5 cm superiorly and laterally to the first injection site.  -Trapezius muscle injection was performed at 3 sites, bilaterally. The first injection site was in the upper trapezius muscle halfway between the inflection point of the neck, and the acromion. The second injection site was one half way between the acromion and the first injection site. The third injection was done between the first injection site and the inflection point of the neck.   A 200 unit bottle of Botox was used, 155 units were injected, the rest of the Botox was wasted. The patient tolerated the procedure well, there were no complications of the above procedure.  Botox NDC [id number] Lot number [id number] Expiration date September 2021

## 2018-03-15 MED ORDER — PERAMPANEL 2 MG PO TABS: ORAL_TABLET | ORAL | 3 refills | Status: DC

## 2018-03-15 NOTE — Telephone Encounter (Signed)
Took call from [place]. In phone room. Spoke with Victorino DikeJennifer w/ BCBS. She wanted to verify pt dx. Wanting to know if it was for partial onset seizures or not. Advised it is for unspecified convulsions ICD10: R56.9. I verified w/ MD yesterday. She verbalized understanding and will send back to medical director for review. They will let us know determination. Nothing further needed.

## 2018-03-15 NOTE — Addendum Note (Signed)
Addended by: York SpanielWILLIS, Toye Rouillard K on: 03/15/2018 05:21 PM   Modules accepted: Orders

## 2018-03-15 NOTE — Telephone Encounter (Signed)
The Briviact was not covered through insurance, we will try Fycompa which does have an indication for generalized seizures.

## 2018-03-15 NOTE — Telephone Encounter (Signed)
Received fax notification from [facility] that [place] Briviact denied because  It is only approved for use in partial-onset seizures in member with epilepsy. Will send to CW,MD for next steps.

## 2018-03-21 ENCOUNTER — Telehealth: Payer: Self-pay | Admitting: Neurology

## 2018-03-21 MED ORDER — HYDROCODONE-ACETAMINOPHEN 5-325 MG PO TABS: 1.0000 | ORAL_TABLET | Freq: Four times a day (QID) | ORAL | 0 refills | Status: DC | PRN

## 2018-03-21 NOTE — Telephone Encounter (Signed)
[facility] [facility] was [facility], the hydrocodone will be refilled today.

## 2018-03-21 NOTE — Telephone Encounter (Signed)
Pt requesting a refill for HYDROcodone-acetaminophen (NORCO/VICODIN) 5-[facility]

## 2018-04-18 ENCOUNTER — Telehealth: Payer: Self-pay | Admitting: Neurology

## 2018-04-18 ENCOUNTER — Other Ambulatory Visit: Payer: Self-pay | Admitting: Family Medicine

## 2018-04-18 MED ORDER — HYDROCODONE-ACETAMINOPHEN 5-325 MG PO TABS: 1.0000 | ORAL_TABLET | Freq: Four times a day (QID) | ORAL | 0 refills | Status: DC | PRN

## 2018-04-18 NOTE — Telephone Encounter (Signed)
Last OV 03/14/18, No future OV  Last filled 10/30/17, # 30 with 5 refills

## 2018-04-18 NOTE — Addendum Note (Signed)
Addended by: York Spaniel on: 04/18/2018 03:54 PM   Modules accepted: Orders

## 2018-04-18 NOTE — Telephone Encounter (Signed)
Patient requesting refill of HYDROcodone-acetaminophen (NORCO/VICODIN) 5-325 MG tablet [facility] [address] ° ° ° °

## 2018-04-18 NOTE — Telephone Encounter (Signed)
Call in #30 with 5 rf 

## 2018-04-18 NOTE — Telephone Encounter (Signed)
The prescription for hydrocodone will be sent in, the [facility] registry was checked.

## 2018-04-20 NOTE — Telephone Encounter (Signed)
Medication phoned to pharmacy as requested.  

## 2018-05-06 ENCOUNTER — Other Ambulatory Visit: Payer: Self-pay | Admitting: Neurology

## 2018-05-16 ENCOUNTER — Other Ambulatory Visit: Payer: Self-pay | Admitting: Neurology

## 2018-05-16 MED ORDER — HYDROCODONE-ACETAMINOPHEN 5-325 MG PO TABS: 1.0000 | ORAL_TABLET | Freq: Four times a day (QID) | ORAL | 0 refills | Status: DC | PRN

## 2018-05-16 NOTE — Telephone Encounter (Signed)
Patient requesting refill of HYDROcodone-acetaminophen (NORCO/VICODIN) 5-325 MG tablet [facility] [address] ° ° ° °

## 2018-05-16 NOTE — Addendum Note (Signed)
Addended by: Hillis RangeKING, EMMA L on: 05/16/2018 08:42 AM   Modules accepted: Orders

## 2018-06-15 ENCOUNTER — Other Ambulatory Visit: Payer: Self-pay | Admitting: Neurology

## 2018-06-15 MED ORDER — HYDROCODONE-ACETAMINOPHEN 5-325 MG PO TABS: 1.0000 | ORAL_TABLET | Freq: Four times a day (QID) | ORAL | 0 refills | Status: DC | PRN

## 2018-06-15 NOTE — Telephone Encounter (Signed)
Patient requesting refill of HYDROcodone-acetaminophen (NORCO/VICODIN) 5-325 MG tablet [facility] [address] ° ° ° °

## 2018-06-28 ENCOUNTER — Ambulatory Visit: Payer: BLUE CROSS/BLUE SHIELD | Admitting: Neurology

## 2018-06-28 ENCOUNTER — Encounter: Payer: Self-pay | Admitting: Neurology

## 2018-06-28 VITALS — BP 140/84 | HR 71 | Ht 61.0 in | Wt 115.5 lb

## 2018-06-28 DIAGNOSIS — G43711 Chronic migraine without aura, intractable, with status migrainosus: Secondary | ICD-10-CM

## 2018-06-28 MED ORDER — ONABOTULINUMTOXINA 100 UNITS IJ SOLR
200.0000 [IU] | Freq: Once | INTRAMUSCULAR | Status: AC
  Administered 2018-06-28: 200 [IU] via INTRAMUSCULAR

## 2018-06-28 MED ORDER — PREGABALIN 200 MG PO CAPS: 200.0000 mg | ORAL_CAPSULE | Freq: Two times a day (BID) | ORAL | 1 refills | Status: DC

## 2018-06-28 NOTE — Progress Notes (Signed)
The patient comes in today for a Botox injection.  She has not had any further seizures, she could not take Fycompa as it was too expensive.  She is on Lyrica 150 mg twice daily, she tolerates this well.  I will increase the dose of Lyrica taking 200 mg twice daily.  She will remain on her current dose of Zonegran.

## 2018-06-28 NOTE — Procedures (Signed)
     BOTOX PROCEDURE NOTE FOR MIGRAINE HEADACHE   HISTORY: [name] is a 52 year old patient with a history of intractable migraine headaches.  She has gained benefit with the Botox, she comes in for another Botox therapy today.   Description of procedure:  The patient was placed in a sitting position. The standard protocol was used for Botox as follows, with 5 units of Botox injected at each site:   -Procerus muscle, midline injection  -Corrugator muscle, bilateral injection  -Frontalis muscle, bilateral injection, with 2 sites each side, medial injection was performed in the upper one third of the frontalis muscle, in the region vertical from the medial inferior edge of the superior orbital rim. The lateral injection was again in the upper one third of the forehead vertically above the lateral limbus of the cornea, 1.5 cm lateral to the medial injection site.  -Temporalis muscle injection, 4 sites, bilaterally. The first injection was 3 cm above the tragus of the ear, second injection site was 1.5 cm to 3 cm up from the first injection site in line with the tragus of the ear. The third injection site was 1.5-3 cm forward between the first 2 injection sites. The fourth injection site was 1.5 cm posterior to the second injection site.  -Occipitalis muscle injection, 3 sites, bilaterally. The first injection was done one half way between the occipital protuberance and the tip of the mastoid process behind the ear. The second injection site was done lateral and superior to the first, 1 fingerbreadth from the first injection. The third injection site was 1 fingerbreadth superiorly and medially from the first injection site.  -Cervical paraspinal muscle injection, 2 sites, bilateral, the first injection site was 1 cm from the midline of the cervical spine, 3 cm inferior to the lower border of the occipital protuberance. The second injection site was 1.5 cm superiorly and laterally to the  first injection site.  -Trapezius muscle injection was performed at 3 sites, bilaterally. The first injection site was in the upper trapezius muscle halfway between the inflection point of the neck, and the acromion. The second injection site was one half way between the acromion and the first injection site. The third injection was done between the first injection site and the inflection point of the neck.   A 200 unit bottle of Botox was used, 155 units were injected, the rest of the Botox was wasted. The patient tolerated the procedure well, there were no complications of the above procedure.  Botox NDC [id number] Lot number [id number] Expiration date February 2022

## 2018-07-01 ENCOUNTER — Other Ambulatory Visit: Payer: Self-pay | Admitting: Family Medicine

## 2018-07-02 NOTE — Telephone Encounter (Signed)
Last OV 06/28/2018   Last refilled 06/06/2017 disp 90 with 3 refills   Sent to PCP for approval

## 2018-07-16 ENCOUNTER — Other Ambulatory Visit: Payer: Self-pay | Admitting: Neurology

## 2018-07-16 MED ORDER — HYDROCODONE-ACETAMINOPHEN 5-325 MG PO TABS: 1.0000 | ORAL_TABLET | Freq: Four times a day (QID) | ORAL | 0 refills | Status: DC | PRN

## 2018-07-16 NOTE — Telephone Encounter (Signed)
Rx registry checked. Last filled on 06/16/18 for #75. Next OV is 07/31/18.

## 2018-07-16 NOTE — Telephone Encounter (Signed)
Pt request refill forHYDROcodone-acetaminophen (NORCO/VICODIN) 5-325 MG tablet [id number] [place] [address] [place] [address]

## 2018-07-24 DIAGNOSIS — F250 Schizoaffective disorder, bipolar type: Secondary | ICD-10-CM | POA: Diagnosis not present

## 2018-07-31 ENCOUNTER — Ambulatory Visit: Payer: BLUE CROSS/BLUE SHIELD | Admitting: Neurology

## 2018-08-03 ENCOUNTER — Other Ambulatory Visit: Payer: Self-pay | Admitting: Neurology

## 2018-08-14 ENCOUNTER — Other Ambulatory Visit: Payer: Self-pay | Admitting: Neurology

## 2018-08-14 MED ORDER — HYDROCODONE-ACETAMINOPHEN 5-325 MG PO TABS: 1.0000 | ORAL_TABLET | Freq: Four times a day (QID) | ORAL | 0 refills | Status: DC | PRN

## 2018-08-14 NOTE — Addendum Note (Signed)
Addended by: Hillis Range on: 08/14/2018 08:18 AM   Modules accepted: Orders

## 2018-08-14 NOTE — Telephone Encounter (Signed)
Patient requesting refill of HYDROcodone-acetaminophen (NORCO/VICODIN) 5-[facility] MG tablet [facility] [address]

## 2018-08-28 ENCOUNTER — Other Ambulatory Visit: Payer: Self-pay | Admitting: Neurology

## 2018-08-31 ENCOUNTER — Other Ambulatory Visit: Payer: Self-pay | Admitting: Neurology

## 2018-09-03 ENCOUNTER — Telehealth: Payer: Self-pay | Admitting: Neurology

## 2018-09-03 NOTE — Telephone Encounter (Signed)
Last filled on 05/26/18 for #180

## 2018-09-03 NOTE — Telephone Encounter (Signed)
Pt requesting refills for medication pregabalin (LYRICA) 200 MG capsule [facility]

## 2018-09-03 NOTE — Telephone Encounter (Signed)
I called pt. Advised rx Lyrica 200mg  cap refill already sent 06/28/18 #180, 1 refill and lyrica 150mg  cap sent today for her by Dr. Anne Hahn #180, 1 refill. She verbalized understanding.

## 2018-09-12 ENCOUNTER — Other Ambulatory Visit: Payer: Self-pay | Admitting: Neurology

## 2018-09-12 MED ORDER — HYDROCODONE-ACETAMINOPHEN 5-325 MG PO TABS: 1.0000 | ORAL_TABLET | Freq: Four times a day (QID) | ORAL | 0 refills | Status: DC | PRN

## 2018-09-12 NOTE — Telephone Encounter (Signed)
Pt request refill forHYDROcodone-acetaminophen (NORCO/VICODIN) 5-325 MG tablet [id number] [place], [place] - [address] °

## 2018-09-28 ENCOUNTER — Ambulatory Visit: Payer: BLUE CROSS/BLUE SHIELD | Admitting: Neurology

## 2018-09-28 ENCOUNTER — Encounter: Payer: Self-pay | Admitting: Neurology

## 2018-09-28 VITALS — BP 118/70 | HR 72 | Ht 61.0 in | Wt 112.5 lb

## 2018-09-28 DIAGNOSIS — G43711 Chronic migraine without aura, intractable, with status migrainosus: Secondary | ICD-10-CM

## 2018-09-28 MED ORDER — ONABOTULINUMTOXINA 100 UNITS IJ SOLR
200.0000 [IU] | Freq: Once | INTRAMUSCULAR | Status: AC
  Administered 2018-09-28: 200 [IU] via INTRAMUSCULAR

## 2018-09-28 NOTE — Progress Notes (Signed)
Please refer to Botox procedure note. 

## 2018-09-28 NOTE — Procedures (Signed)
     BOTOX PROCEDURE NOTE FOR MIGRAINE HEADACHE   HISTORY: [name] is a 52 year old patient with a history of intractable migraine headaches.  The patient has gained benefit with the Botox, she is still having 5 days a month that are incapacitating with the headaches but she is better off with the Botox therapy than she had been.  She returns for another Botox treatment.   Description of procedure:  The patient was placed in a sitting position. The standard protocol was used for Botox as follows, with 5 units of Botox injected at each site:   -Procerus muscle, midline injection  -Corrugator muscle, bilateral injection  -Frontalis muscle, bilateral injection, with 2 sites each side, medial injection was performed in the upper one third of the frontalis muscle, in the region vertical from the medial inferior edge of the superior orbital rim. The lateral injection was again in the upper one third of the forehead vertically above the lateral limbus of the cornea, 1.5 cm lateral to the medial injection site.  -Temporalis muscle injection, 4 sites, bilaterally. The first injection was 3 cm above the tragus of the ear, second injection site was 1.5 cm to 3 cm up from the first injection site in line with the tragus of the ear. The third injection site was 1.5-3 cm forward between the first 2 injection sites. The fourth injection site was 1.5 cm posterior to the second injection site.  -Occipitalis muscle injection, 3 sites, bilaterally. The first injection was done one half way between the occipital protuberance and the tip of the mastoid process behind the ear. The second injection site was done lateral and superior to the first, 1 fingerbreadth from the first injection. The third injection site was 1 fingerbreadth superiorly and medially from the first injection site.  -Cervical paraspinal muscle injection, 2 sites, bilateral, the first injection site was 1 cm from the midline of the  cervical spine, 3 cm inferior to the lower border of the occipital protuberance. The second injection site was 1.5 cm superiorly and laterally to the first injection site.  -Trapezius muscle injection was performed at 3 sites, bilaterally. The first injection site was in the upper trapezius muscle halfway between the inflection point of the neck, and the acromion. The second injection site was one half way between the acromion and the first injection site. The third injection was done between the first injection site and the inflection point of the neck.   A 200 unit bottle of Botox was used, 155 units were injected, the rest of the Botox was wasted. The patient tolerated the procedure well, there were no complications of the above procedure.  Botox NDC 1610-9604-54 Lot number [id number] Expiration date April 2022

## 2018-10-01 ENCOUNTER — Other Ambulatory Visit: Payer: Self-pay | Admitting: Family Medicine

## 2018-10-01 DIAGNOSIS — Z1231 Encounter for screening mammogram for malignant neoplasm of breast: Secondary | ICD-10-CM

## 2018-10-04 ENCOUNTER — Ambulatory Visit: Payer: BLUE CROSS/BLUE SHIELD | Admitting: Neurology

## 2018-10-04 ENCOUNTER — Other Ambulatory Visit: Payer: Self-pay

## 2018-10-04 ENCOUNTER — Encounter: Payer: Self-pay | Admitting: Neurology

## 2018-10-04 VITALS — BP 123/78 | HR 72 | Resp 14 | Ht 61.0 in | Wt 111.0 lb

## 2018-10-04 DIAGNOSIS — G43019 Migraine without aura, intractable, without status migrainosus: Secondary | ICD-10-CM

## 2018-10-04 DIAGNOSIS — F5104 Psychophysiologic insomnia: Secondary | ICD-10-CM

## 2018-10-04 DIAGNOSIS — R569 Unspecified convulsions: Secondary | ICD-10-CM

## 2018-10-04 NOTE — Progress Notes (Signed)
Reason for visit: Migraine headache, seizures  [name] is an 52 y.o. female  History of present illness:  [name] is a 52 year old right-handed white female with a history of depression, migraine headaches, and seizures.  The patient has had one seizure within the last year.  She does not operate a motor vehicle.  The patient is on Lyrica and Zonegran.  She is on Botox for her migraine, she still has frequent headaches but the headaches are less severe on the Botox, she believes that ongoing treatment is worthwhile.  The patient does take hydrocodone regularly, she is interested in trying to taper off the medication.  She is not sleeping well, she takes Ambien 10 mg at night and trazodone.  The patient is able to get to sleep well, but she will wake up after about 4 hours and she is not able to get back to sleep.  She returns to this office for an evaluation.  She does have daytime fatigue.  She is not very active during the day.  Past Medical History:  Diagnosis Date  . Anxiety   . Arthritis   . Chronic constipation 01/10/2014  . Depression    sees Deatra Robinson NP   . Epilepsy (HCC)    seizures, sees Dr. Anne Hahn  . Gallstones   . Headache(784.0)    migraine  . Headache, common migraine, intractable 09/09/2015  . Hemorrhoids   . Hypertension   . Insomnia   . Obsessive compulsive disorder   . Suicidal ideation     Past Surgical History:  Procedure Laterality Date  . ABDOMINAL HYSTERECTOMY  2001  . APPENDECTOMY Right    with ovarian cyst removal  . BREAST EXCISIONAL BIOPSY     left 2008  . BREAST LUMPECTOMY Left 01/2007   Dr. Abbey Chatters  . CESAREAN SECTION    . CHOLECYSTECTOMY    . HEMORRHOID BANDING  2015  . TONSILLECTOMY      Family History  Problem Relation Age of Onset  . Cirrhosis Mother   . Stomach cancer Mother   . Heart disease Mother   . Heart disease Father   . Hypertension Father   . Thyroid cancer Brother   . HIV Brother   . Pancreatic  cancer Maternal Uncle     Social history:  reports that she has never smoked. She has never used smokeless tobacco. She reports that she drinks alcohol. She reports that she does not use drugs.    Allergies  Allergen Reactions  . Vimpat [Lacosamide]     Increased depression    Medications:  Prior to Admission medications   Medication Sig Start Date End Date Taking? Authorizing Provider  HYDROcodone-acetaminophen (NORCO/VICODIN) 5-325 MG tablet Take 1 tablet by mouth every 6 (six) hours as needed for moderate pain. 09/12/18  Yes York Spaniel, MD  lurasidone (LATUDA) 80 MG TABS tablet Take 80 mg by mouth at bedtime.   Yes [provider]  pregabalin (LYRICA) 200 MG capsule Take 1 capsule (200 mg total) by mouth 2 (two) times daily. 06/28/18  Yes York Spaniel, MD  PREMARIN 0.625 MG tablet TAKE 1 TABLET BY MOUTH EVERY DAY FOR 21 DAYS THEN DO NOT TAKE FOR 7 DAYS 07/02/18  Yes Nelwyn Salisbury, MD  scopolamine (TRANSDERM-SCOP, 1.5 MG,) 1 MG/3DAYS Place 1 patch (1.5 mg total) onto the skin every 3 (three) days. 03/14/18  Yes Nelwyn Salisbury, MD  trazodone (DESYREL) 300 MG tablet Take 300 mg by mouth  at bedtime.   Yes [provider]  zolpidem (AMBIEN) 10 MG tablet TAKE 1 TABLET BY MOUTH AT BEDTIME AS NEEDED 04/20/18  Yes Nelwyn Salisbury, MD  zonisamide (ZONEGRAN) 100 MG capsule TAKE 3 CAPSULES BY MOUTH 2 TIMES A DAY 08/03/18  Yes York Spaniel, MD    ROS:  Out of a complete 14 system review of symptoms, the patient complains only of the following symptoms, and all other reviewed systems are negative.  Blurred vision Headache  Blood pressure 123/78, pulse 72, resp. rate 14, height 5\' 1"  (1.549 m), weight 111 lb (50.3 kg).  Physical Exam  General: The patient is alert and cooperative at the time of the examination.  Affect is somewhat flat  Skin: No significant peripheral edema is noted.   Neurologic Exam  Mental status: The patient is alert and oriented x 3 at  the time of the examination. The patient has apparent normal recent and remote memory, with an apparently normal attention span and concentration ability.   Cranial nerves: Facial symmetry is present. Speech is normal, no aphasia or dysarthria is noted. Extraocular movements are full. Visual fields are full.  Motor: The patient has good strength in all 4 extremities.  Sensory examination: Soft touch sensation is symmetric on the face, arms, and legs.  Coordination: The patient has good finger-nose-finger and heel-to-shin bilaterally.  Gait and station: The patient has a normal gait. Tandem gait is normal. Romberg is negative. No drift is seen.  Reflexes: Deep tendon reflexes are symmetric.   Assessment/Plan:  1.  Intractable migraine headache  2.  Seizure disorder  3.  Depression  4.  Chronic insomnia  The patient will continue the Zonegran and Lyrica.  I have suggested a slow taper off of the hydrocodone by 1/2 tablet every 6 weeks.  We have discussed the fact that I will be retiring in 1 year, the patient will either need to find another source for hydrocodone or stop the medication.  She may try to determine whether her primary care physician would be willing to write a prescription.  I have also suggested potentially switching to Ambien CR at night as she tends to wake up after 4 hours of sleep, the CR tablet may allow her to sleep longer.  The patient will follow-up in 6 months.  She will continue her Botox therapy.  Marlan Palau MD 10/04/2018 1:49 PM  [facility] [address] [place], [place] [address]  Phone [phone] Fax [phone]

## 2018-10-11 ENCOUNTER — Telehealth: Payer: Self-pay | Admitting: Neurology

## 2018-10-11 ENCOUNTER — Other Ambulatory Visit: Payer: Self-pay | Admitting: Family Medicine

## 2018-10-11 MED ORDER — HYDROCODONE-ACETAMINOPHEN 5-325 MG PO TABS: 1.0000 | ORAL_TABLET | Freq: Four times a day (QID) | ORAL | 0 refills | Status: DC | PRN

## 2018-10-11 NOTE — Telephone Encounter (Signed)
Pt requesting refills for HYDROcodone-acetaminophen (NORCO/VICODIN) 5-[facility]

## 2018-10-11 NOTE — Telephone Encounter (Signed)
Error

## 2018-10-11 NOTE — Telephone Encounter (Signed)
Hydrocodone prescription was sent in, the [facility] registry was checked.

## 2018-10-16 NOTE — Telephone Encounter (Signed)
Pt is checking status on her Ambien refill. She states she is out of medication. Please advise.

## 2018-10-18 NOTE — Telephone Encounter (Signed)
Pt called to check status of refill request. She has been without medication for several days. Refill request was sent 7 days ago.   Dr. Clent RidgesFry - FYI. Thanks!

## 2018-10-19 ENCOUNTER — Other Ambulatory Visit: Payer: Self-pay | Admitting: Family Medicine

## 2018-10-19 NOTE — Telephone Encounter (Signed)
This was sent in  

## 2018-10-19 NOTE — Telephone Encounter (Signed)
Copied from CRM [id number]. Topic: Quick Communication - Rx Refill/Question >> Oct 19, 2018  2:03 PM Jaquita RectorDavis, Karen A wrote: Medication: zolpidem (AMBIEN) 10 MG tablet   Per patient she have been without medication for few days and need it for sleep at night.   Has the patient contacted their pharmacy? Yes.     Preferred Pharmacy (with phone number or street name): [id number] [place] [address] [phone] (Phone) [phone] (Fax)    Agent: Please be advised that RX refills may take up to 3 business days. We ask that you follow-up with your pharmacy.

## 2018-11-09 ENCOUNTER — Other Ambulatory Visit: Payer: Self-pay | Admitting: Neurology

## 2018-11-09 MED ORDER — HYDROCODONE-ACETAMINOPHEN 5-325 MG PO TABS: 1.0000 | ORAL_TABLET | Freq: Four times a day (QID) | ORAL | 0 refills | Status: DC | PRN

## 2018-11-09 NOTE — Telephone Encounter (Signed)
Pt requesting refills for HYDROcodone-acetaminophen (NORCO/VICODIN) 5-[facility]

## 2018-11-13 ENCOUNTER — Ambulatory Visit
Admission: RE | Admit: 2018-11-13 | Discharge: 2018-11-13 | Disposition: A | Discharge status: Home / Self Care | Payer: BLUE CROSS/BLUE SHIELD | Source: Ambulatory Visit | Attending: Family Medicine | Admitting: Family Medicine

## 2018-11-13 DIAGNOSIS — Z1231 Encounter for screening mammogram for malignant neoplasm of breast: Secondary | ICD-10-CM

## 2018-11-14 ENCOUNTER — Other Ambulatory Visit: Payer: Self-pay | Admitting: Family Medicine

## 2018-11-14 DIAGNOSIS — R928 Other abnormal and inconclusive findings on diagnostic imaging of breast: Secondary | ICD-10-CM

## 2018-11-15 ENCOUNTER — Telehealth: Payer: Self-pay | Admitting: Family Medicine

## 2018-11-15 NOTE — Telephone Encounter (Signed)
Copied from CRM [id number]. Topic: Quick Communication - See Telephone Encounter >> Nov 15, 2018  1:58 PM Jens SomMedley, Jennifer A wrote: CRM for notification. See Telephone encounter for: 11/15/18.  [facility] regarding [facility] for an abnormal mammogram.  The order was sent in Epic and just needs to be signed by Dr. Clent RidgesFry. Please advise [phone]

## 2018-11-16 ENCOUNTER — Other Ambulatory Visit: Payer: Self-pay | Admitting: Family Medicine

## 2018-11-16 ENCOUNTER — Ambulatory Visit
Admission: RE | Admit: 2018-11-16 | Discharge: 2018-11-16 | Disposition: A | Discharge status: Home / Self Care | Payer: BLUE CROSS/BLUE SHIELD | Source: Ambulatory Visit | Attending: Family Medicine | Admitting: Family Medicine

## 2018-11-16 DIAGNOSIS — R921 Mammographic calcification found on diagnostic imaging of breast: Secondary | ICD-10-CM | POA: Diagnosis not present

## 2018-11-16 DIAGNOSIS — R928 Other abnormal and inconclusive findings on diagnostic imaging of breast: Secondary | ICD-10-CM

## 2018-11-16 NOTE — Telephone Encounter (Signed)
Dr. Clent RidgesFry could you sign this order in epic for the mammogram?  Thanks

## 2018-11-16 NOTE — Telephone Encounter (Signed)
Called the breast center and the diagnostic mammogram has been done and has been signed.

## 2018-11-16 NOTE — Telephone Encounter (Signed)
[facility] did not order [facility], [facility]. Please call them to straighten this out

## 2018-12-10 ENCOUNTER — Telehealth: Payer: Self-pay | Admitting: Neurology

## 2018-12-10 MED ORDER — HYDROCODONE-ACETAMINOPHEN 5-325 MG PO TABS: 1.0000 | ORAL_TABLET | Freq: Four times a day (QID) | ORAL | 0 refills | Status: DC | PRN

## 2018-12-10 NOTE — Telephone Encounter (Signed)
Pt is calling for a refill for her HYDROcodone-acetaminophen (NORCO/VICODIN) 5-325 MG tablet [facility]

## 2018-12-10 NOTE — Telephone Encounter (Signed)
The Rx for the hydrocodone was sent in.

## 2018-12-10 NOTE — Telephone Encounter (Signed)
Pt requesting refill on Hydrocodone 5-325 mg. Drug registry verified, last refill was written on 11/09/2018 # 75 for a 19 day supply, provided by our office. Last o/v was 10/04/2018 and next o/v scheduled for 01/02/19.

## 2018-12-10 NOTE — Addendum Note (Signed)
Addended by: York Spaniel on: 12/10/2018 03:58 PM   Modules accepted: Orders

## 2019-01-02 ENCOUNTER — Ambulatory Visit: Payer: BLUE CROSS/BLUE SHIELD | Admitting: Neurology

## 2019-01-02 ENCOUNTER — Telehealth: Payer: Self-pay | Admitting: Neurology

## 2019-01-02 ENCOUNTER — Encounter: Payer: Self-pay | Admitting: Neurology

## 2019-01-02 VITALS — BP 129/75 | HR 65 | Ht 61.0 in | Wt 112.3 lb

## 2019-01-02 DIAGNOSIS — G43711 Chronic migraine without aura, intractable, with status migrainosus: Secondary | ICD-10-CM | POA: Diagnosis not present

## 2019-01-02 MED ORDER — ONABOTULINUMTOXINA 100 UNITS IJ SOLR
200.0000 [IU] | Freq: Once | INTRAMUSCULAR | Status: AC
  Administered 2019-01-02: 200 [IU] via INTRAMUSCULAR

## 2019-01-02 NOTE — Procedures (Signed)
     BOTOX PROCEDURE NOTE FOR MIGRAINE HEADACHE   HISTORY: [name] is a 53 year old patient with a history of intractable migraine headache.  The patient has definitely gained benefit with the Botox, she is still having about 4 days a month that the headaches are incapacitating.  The patient is able to function better the other days.  She returns for another Botox therapy.   Description of procedure:  The patient was placed in a sitting position. The standard protocol was used for Botox as follows, with 5 units of Botox injected at each site:   -Procerus muscle, midline injection  -Corrugator muscle, bilateral injection  -Frontalis muscle, bilateral injection, with 2 sites each side, medial injection was performed in the upper one third of the frontalis muscle, in the region vertical from the medial inferior edge of the superior orbital rim. The lateral injection was again in the upper one third of the forehead vertically above the lateral limbus of the cornea, 1.5 cm lateral to the medial injection site.  -Temporalis muscle injection, 4 sites, bilaterally. The first injection was 3 cm above the tragus of the ear, second injection site was 1.5 cm to 3 cm up from the first injection site in line with the tragus of the ear. The third injection site was 1.5-3 cm forward between the first 2 injection sites. The fourth injection site was 1.5 cm posterior to the second injection site.  -Occipitalis muscle injection, 3 sites, bilaterally. The first injection was done one half way between the occipital protuberance and the tip of the mastoid process behind the ear. The second injection site was done lateral and superior to the first, 1 fingerbreadth from the first injection. The third injection site was 1 fingerbreadth superiorly and medially from the first injection site.  -Cervical paraspinal muscle injection, 2 sites, bilateral, the first injection site was 1 cm from the midline of the  cervical spine, 3 cm inferior to the lower border of the occipital protuberance. The second injection site was 1.5 cm superiorly and laterally to the first injection site.  -Trapezius muscle injection was performed at 3 sites, bilaterally. The first injection site was in the upper trapezius muscle halfway between the inflection point of the neck, and the acromion. The second injection site was one half way between the acromion and the first injection site. The third injection was done between the first injection site and the inflection point of the neck.   A 200 unit bottle of Botox was used, 155 units were injected, the rest of the Botox was wasted. The patient tolerated the procedure well, there were no complications of the above procedure.  Botox NDC 1610-9604-54 Lot number [id number]  Expiration date May 2022

## 2019-01-02 NOTE — Telephone Encounter (Signed)
I contacted the pt on mobile # and was able to discuss moving o/v. I requested we move f/u to 04/19/19 at 12 pm with a 11:30 am check in and pt was agreeable.  Appt for 04/09/19 canceled and rescheduled for 04/19/19 at 12 PM.

## 2019-01-02 NOTE — Progress Notes (Signed)
Please refer to Botox procedure note. 

## 2019-01-02 NOTE — Telephone Encounter (Signed)
Met with the patient in office today. She has a follow up in May. Her botox is due April 29th. Due to insurance regulations she cannot have a follow up 10 days within a Botox. Can she be moved for her follow up or does need to come in for both apts?

## 2019-01-03 NOTE — Telephone Encounter (Signed)
Noted, thank you

## 2019-01-08 ENCOUNTER — Telehealth: Payer: Self-pay | Admitting: Neurology

## 2019-01-08 MED ORDER — HYDROCODONE-ACETAMINOPHEN 5-325 MG PO TABS: 1.0000 | ORAL_TABLET | Freq: Four times a day (QID) | ORAL | 0 refills | Status: DC | PRN

## 2019-01-08 NOTE — Telephone Encounter (Signed)
[place] drug registry verified. Last refill was written on 12/10/18 # 75 for a 18 day supply provided by Dr. Anne Hahn.

## 2019-01-08 NOTE — Telephone Encounter (Signed)
Pt request refill for HYDROcodone-acetaminophen (NORCO/VICODIN) 5-[facility] MG tablet [facility]

## 2019-01-08 NOTE — Addendum Note (Signed)
Addended by: York Spaniel on: 01/08/2019 12:32 PM   Modules accepted: Orders

## 2019-01-08 NOTE — Telephone Encounter (Signed)
The prescription will be refilled.

## 2019-01-17 DIAGNOSIS — F250 Schizoaffective disorder, bipolar type: Secondary | ICD-10-CM | POA: Diagnosis not present

## 2019-02-05 ENCOUNTER — Telehealth: Payer: Self-pay | Admitting: Neurology

## 2019-02-05 MED ORDER — HYDROCODONE-ACETAMINOPHEN 5-325 MG PO TABS: 1.0000 | ORAL_TABLET | Freq: Four times a day (QID) | ORAL | 0 refills | Status: DC | PRN

## 2019-02-05 NOTE — Telephone Encounter (Signed)
I contacted the pt and left a vm explaining we do not print copies for hydrocodone prescriptions any longer.  [place] drug registry verified. Last refill was 01/08/19 # 75 for a 19 day supply provided by Dr. Anne Hahn. Pt has been complaint with f/u's.

## 2019-02-05 NOTE — Telephone Encounter (Signed)
Patient is calling in for a printed prescription for HYDROcodone-acetaminophen (NORCO/VICODIN) 5-325 MG tablet

## 2019-02-05 NOTE — Addendum Note (Signed)
Addended by: York Spaniel on: 02/05/2019 05:09 PM   Modules accepted: Orders

## 2019-02-05 NOTE — Telephone Encounter (Signed)
The prescription of hydrocodone will be called in.

## 2019-02-11 ENCOUNTER — Other Ambulatory Visit: Payer: Self-pay | Admitting: Neurology

## 2019-03-04 ENCOUNTER — Other Ambulatory Visit: Payer: Self-pay | Admitting: Neurology

## 2019-03-06 ENCOUNTER — Telehealth: Payer: Self-pay | Admitting: Neurology

## 2019-03-06 MED ORDER — HYDROCODONE-ACETAMINOPHEN 5-325 MG PO TABS: 1.0000 | ORAL_TABLET | Freq: Four times a day (QID) | ORAL | 0 refills | Status: DC | PRN

## 2019-03-06 NOTE — Telephone Encounter (Signed)
[place] drug registry verified. Last refill was 02/05/19 # 75 for a 30 day supply provided by Dr. Stephanie Acre. Pt has been compliant with f/u's.

## 2019-03-06 NOTE — Telephone Encounter (Signed)
The hydrocodone prescription will be sent in. 

## 2019-03-06 NOTE — Telephone Encounter (Signed)
Pt would like refill of HYDROcodone-acetaminophen (NORCO/VICODIN) 5-[facility] MG tablet [facility] [address]

## 2019-03-07 ENCOUNTER — Telehealth: Payer: Self-pay | Admitting: Neurology

## 2019-03-07 MED ORDER — PREGABALIN 150 MG PO CAPS: 150.0000 mg | ORAL_CAPSULE | Freq: Two times a day (BID) | ORAL | 1 refills | Status: DC

## 2019-03-07 NOTE — Telephone Encounter (Signed)
Pt is needing a refill of her Lyrica 150 mg. [place] drug registry verified. Last refill was 12/02/2018 # 180 for a 90 day supply provided by Dr. Stephanie Acre. Pt has been compliant with f/u's.

## 2019-03-07 NOTE — Telephone Encounter (Signed)
Pt states that her medication is mixed up, pt is referring to her pregabalin (LYRICA) 150 MG capsule.  Pt states [id number]  About her [facility]ill.  Pt is asking for a call

## 2019-03-07 NOTE — Addendum Note (Signed)
Addended by: York Spaniel on: 03/07/2019 05:06 PM   Modules accepted: Orders

## 2019-03-07 NOTE — Telephone Encounter (Signed)
I called the patient.  The patient is try to get the prescription for the 150 mg Lyrica, our records indicate that this was sent in on 04 March 2019 to the pharmacy, the pharmacy indicates that he never got it.  I will resend the prescription.

## 2019-03-28 ENCOUNTER — Telehealth: Payer: Self-pay

## 2019-03-28 NOTE — Telephone Encounter (Signed)
Pa for botox has been singed and faxed to [facility] ( Fax # [phone]). Confirmation received.

## 2019-03-28 NOTE — Telephone Encounter (Signed)
Noted, thank you. DW  °

## 2019-04-01 NOTE — Telephone Encounter (Signed)
Noted, thank you. DW  °

## 2019-04-01 NOTE — Telephone Encounter (Signed)
PA approval has been received from [facility]. PA effective 03/28/19-03/27/2020.

## 2019-04-02 ENCOUNTER — Telehealth: Payer: Self-pay

## 2019-04-02 NOTE — Telephone Encounter (Signed)
I contacted the pt and was able to discuss botox scheduled for 04/03/19. Due to current COVID 19 pandemic, our office is severely reducing in office visits for at least the next 2 weeks, in order to minimize the risk to our patients and healthcare providers.   Pt was in agreement to moving her 04/03/19 appt out 4 weeks. And states she would like to cancel her 6 month f/u scheduled in May since she is doing well. Appt has been canceled and I advised we would need to see her on a yearly basis at the very least.

## 2019-04-03 ENCOUNTER — Ambulatory Visit: Payer: BLUE CROSS/BLUE SHIELD | Admitting: Neurology

## 2019-04-03 ENCOUNTER — Telehealth: Payer: Self-pay | Admitting: Neurology

## 2019-04-03 MED ORDER — HYDROCODONE-ACETAMINOPHEN 5-325 MG PO TABS: 1.0000 | ORAL_TABLET | Freq: Four times a day (QID) | ORAL | 0 refills | Status: DC | PRN

## 2019-04-03 NOTE — Addendum Note (Signed)
Addended by: York Spaniel on: 04/03/2019 12:14 PM   Modules accepted: Orders

## 2019-04-03 NOTE — Telephone Encounter (Signed)
Pt has called for a refill on her HYDROcodone-acetaminophen (NORCO/VICODIN) 5-325 MG tablet [phone]

## 2019-04-03 NOTE — Telephone Encounter (Signed)
[place] drug registry verified. Last refill was 03/06/2019 # 75 for a 19 day supply provided by Dr. Anne Hahn.

## 2019-04-03 NOTE — Telephone Encounter (Signed)
The prescription for hydrocodone will be sent in. 

## 2019-04-08 ENCOUNTER — Other Ambulatory Visit: Payer: Self-pay | Admitting: Family Medicine

## 2019-04-09 ENCOUNTER — Ambulatory Visit: Payer: BLUE CROSS/BLUE SHIELD | Admitting: Neurology

## 2019-04-11 ENCOUNTER — Telehealth: Payer: Self-pay | Admitting: *Deleted

## 2019-04-11 NOTE — Telephone Encounter (Signed)
Copied from CRM [id number]. Topic: General - Other >> Apr 11, 2019  8:35 AM Percival Spanish wrote: Pt req refill has not been seen in a year   zolpidem (AMBIEN) 10 MG tablet

## 2019-04-11 NOTE — Telephone Encounter (Signed)
Called and spoke with pt and she is aware of virtual appt tomorrow with Dr. Clent Ridges for refills.

## 2019-04-12 ENCOUNTER — Encounter: Payer: Self-pay | Admitting: Family Medicine

## 2019-04-12 ENCOUNTER — Other Ambulatory Visit: Payer: Self-pay

## 2019-04-12 ENCOUNTER — Ambulatory Visit (INDEPENDENT_AMBULATORY_CARE_PROVIDER_SITE_OTHER): Payer: BLUE CROSS/BLUE SHIELD | Admitting: Family Medicine

## 2019-04-12 DIAGNOSIS — F5104 Psychophysiologic insomnia: Secondary | ICD-10-CM

## 2019-04-12 MED ORDER — ZOLPIDEM TARTRATE 10 MG PO TABS: 10.0000 mg | ORAL_TABLET | Freq: Every evening | ORAL | 5 refills | Status: DC | PRN

## 2019-04-12 NOTE — Progress Notes (Signed)
Subjective:    Patient ID: [name], female    DOB: 03-28-1966, 53 y.o.   MRN: [id number]  HPI Virtual Visit via Video Note  I connected with the patient on 04/12/19 at  3:30 PM EDT by a video enabled telemedicine application and verified that I am speaking with the correct person using two identifiers.  Location patient: home Location provider:work or home office Persons participating in the virtual visit: patient, provider  I discussed the limitations of evaluation and management by telemedicine and the availability of in person appointments. The patient expressed understanding and agreed to proceed.   HPI: Here for refills on Ambien. This has been working well for her. She generally sleeps well. She still sees Deatra Robinson NP for her depression and Dr. Stephanie Acre for headaches.    ROS: See pertinent positives and negatives per HPI.  Past Medical History:  Diagnosis Date  . Anxiety   . Arthritis   . Chronic constipation 01/10/2014  . Depression    sees Deatra Robinson NP   . Epilepsy (HCC)    seizures, sees Dr. Anne Hahn  . Gallstones   . Headache(784.0)    migraine  . Headache, common migraine, intractable 09/09/2015  . Hemorrhoids   . Hypertension   . Insomnia   . Obsessive compulsive disorder   . Suicidal ideation     Past Surgical History:  Procedure Laterality Date  . ABDOMINAL HYSTERECTOMY  2001  . APPENDECTOMY Right    with ovarian cyst removal  . BREAST EXCISIONAL BIOPSY     left 2008  . BREAST LUMPECTOMY Left 01/2007   Dr. Abbey Chatters  . CESAREAN SECTION    . CHOLECYSTECTOMY    . HEMORRHOID BANDING  2015  . TONSILLECTOMY      Family History  Problem Relation Age of Onset  . Cirrhosis Mother   . Stomach cancer Mother   . Heart disease Mother   . Heart disease Father   . Hypertension Father   . Thyroid cancer Brother   . HIV Brother   . Pancreatic cancer Maternal Uncle      Current Outpatient Medications:  .  HYDROcodone-acetaminophen  (NORCO/VICODIN) 5-325 MG tablet, Take 1 tablet by mouth every 6 (six) hours as needed for moderate pain., Disp: 75 tablet, Rfl: 0 .  lurasidone (LATUDA) 80 MG TABS tablet, Take 80 mg by mouth at bedtime., Disp: , Rfl:  .  pregabalin (LYRICA) 150 MG capsule, Take 1 capsule (150 mg total) by mouth 2 (two) times daily., Disp: 180 capsule, Rfl: 1 .  PREMARIN 0.625 MG tablet, TAKE 1 TABLET BY MOUTH EVERY DAY FOR 21 DAYS THEN DO NOT TAKE FOR 7 DAYS, Disp: 90 tablet, Rfl: 3 .  scopolamine (TRANSDERM-SCOP, 1.5 MG,) 1 MG/3DAYS, Place 1 patch (1.5 mg total) onto the skin every 3 (three) days., Disp: 10 patch, Rfl: 0 .  trazodone (DESYREL) 300 MG tablet, Take 300 mg by mouth at bedtime., Disp: , Rfl:  .  zolpidem (AMBIEN) 10 MG tablet, TAKE 1 TABLET BY MOUTH AT BEDTIME AS NEEDED, Disp: 30 tablet, Rfl: 5 .  zonisamide (ZONEGRAN) 100 MG capsule, TAKE 3 CAPSULES BY MOUTH 2 TIMES A DAY, Disp: 540 capsule, Rfl: 1  EXAM:  VITALS per patient if applicable:  GENERAL: alert, oriented, appears well and in no acute distress  HEENT: atraumatic, conjunttiva clear, no obvious abnormalities on inspection of external nose and ears  NECK: normal movements of the head and neck  LUNGS: on inspection no signs  of respiratory distress, breathing rate appears normal, no obvious gross SOB, gasping or wheezing  CV: no obvious cyanosis  MS: moves all visible extremities without noticeable abnormality  PSYCH/NEURO: pleasant and cooperative, no obvious depression or anxiety, speech and thought processing grossly intact  ASSESSMENT AND PLAN: Insomnia, we will refill the Ambien. Recheck prn.  Gershon CraneStephen Salahuddin Arismendez, MD  Discussed the following assessment and plan:  No diagnosis found.     I discussed the assessment and treatment plan with the patient. The patient was provided an opportunity to ask questions and all were answered. The patient agreed with the plan and demonstrated an understanding of the instructions.   The  patient was advised to call back or seek an in-person evaluation if the symptoms worsen or if the condition fails to improve as anticipated.     Review of Systems     Objective:   Physical Exam        Assessment & Plan:

## 2019-04-15 ENCOUNTER — Telehealth: Payer: Self-pay

## 2019-04-15 NOTE — Telephone Encounter (Signed)
I contacted the pt and provided a reminder call for 11 am botox appt scheduled for 04/16/19.

## 2019-04-16 ENCOUNTER — Ambulatory Visit: Payer: Self-pay | Admitting: Neurology

## 2019-04-16 ENCOUNTER — Encounter: Payer: Self-pay | Admitting: Neurology

## 2019-04-16 ENCOUNTER — Telehealth: Payer: Self-pay | Admitting: Neurology

## 2019-04-16 ENCOUNTER — Ambulatory Visit (INDEPENDENT_AMBULATORY_CARE_PROVIDER_SITE_OTHER): Payer: BLUE CROSS/BLUE SHIELD | Admitting: Neurology

## 2019-04-16 ENCOUNTER — Other Ambulatory Visit: Payer: Self-pay

## 2019-04-16 VITALS — BP 139/80 | HR 80 | Ht 61.0 in | Wt 112.5 lb

## 2019-04-16 DIAGNOSIS — G43019 Migraine without aura, intractable, without status migrainosus: Secondary | ICD-10-CM | POA: Diagnosis not present

## 2019-04-16 MED ORDER — ONABOTULINUMTOXINA 100 UNITS IJ SOLR
200.0000 [IU] | Freq: Once | INTRAMUSCULAR | Status: AC
  Administered 2019-04-16: 200 [IU] via INTRAMUSCULAR

## 2019-04-16 NOTE — Procedures (Signed)
     BOTOX PROCEDURE NOTE FOR MIGRAINE HEADACHE   HISTORY: [name] is a 53 year old patient with a history of intractable migraine headaches.  The patient seems to respond to the Botox injections, she comes in for another Botox therapy.  She clearly believes that this does result in a reduction of headache frequency and severity.   Description of procedure:  The patient was placed in a sitting position. The standard protocol was used for Botox as follows, with 5 units of Botox injected at each site:   -Procerus muscle, midline injection  -Corrugator muscle, bilateral injection  -Frontalis muscle, bilateral injection, with 2 sites each side, medial injection was performed in the upper one third of the frontalis muscle, in the region vertical from the medial inferior edge of the superior orbital rim. The lateral injection was again in the upper one third of the forehead vertically above the lateral limbus of the cornea, 1.5 cm lateral to the medial injection site.  -Temporalis muscle injection, 4 sites, bilaterally. The first injection was 3 cm above the tragus of the ear, second injection site was 1.5 cm to 3 cm up from the first injection site in line with the tragus of the ear. The third injection site was 1.5-3 cm forward between the first 2 injection sites. The fourth injection site was 1.5 cm posterior to the second injection site.  -Occipitalis muscle injection, 3 sites, bilaterally. The first injection was done one half way between the occipital protuberance and the tip of the mastoid process behind the ear. The second injection site was done lateral and superior to the first, 1 fingerbreadth from the first injection. The third injection site was 1 fingerbreadth superiorly and medially from the first injection site.  -Cervical paraspinal muscle injection, 2 sites, bilateral, the first injection site was 1 cm from the midline of the cervical spine, 3 cm inferior to the lower  border of the occipital protuberance. The second injection site was 1.5 cm superiorly and laterally to the first injection site.  -Trapezius muscle injection was performed at 3 sites, bilaterally. The first injection site was in the upper trapezius muscle halfway between the inflection point of the neck, and the acromion. The second injection site was one half way between the acromion and the first injection site. The third injection was done between the first injection site and the inflection point of the neck.   A 200 unit bottle of Botox was used, 155 units were injected, the rest of the Botox was wasted. The patient tolerated the procedure well, there were no complications of the above procedure.  Botox NDC 3295-1884-16 Lot number [id number] Expiration date October 2022

## 2019-04-16 NOTE — Telephone Encounter (Signed)
3 mo botox inj w/ Dr. Anne Hahn

## 2019-04-16 NOTE — Progress Notes (Signed)
Please refer to Botox procedure note. 

## 2019-04-17 NOTE — Telephone Encounter (Signed)
I called the patient to schedule but she did not answer so I left a VM asking her to call me back.  °

## 2019-04-17 NOTE — Telephone Encounter (Signed)
I called and scheduled the patient for her August Botox apt. DW

## 2019-04-17 NOTE — Telephone Encounter (Signed)
Pt returned call

## 2019-04-19 ENCOUNTER — Ambulatory Visit: Payer: Self-pay | Admitting: Neurology

## 2019-05-01 ENCOUNTER — Telehealth: Payer: Self-pay | Admitting: Neurology

## 2019-05-01 MED ORDER — HYDROCODONE-ACETAMINOPHEN 5-325 MG PO TABS: 1.0000 | ORAL_TABLET | Freq: Four times a day (QID) | ORAL | 0 refills | Status: DC | PRN

## 2019-05-01 NOTE — Telephone Encounter (Signed)
The hydrocodone prescription will be sent in. 

## 2019-05-01 NOTE — Telephone Encounter (Signed)
Pt is needing a refill on her HYDROcodone-acetaminophen (NORCO/VICODIN) 5-325 MG tablet [facility] [address]

## 2019-05-01 NOTE — Telephone Encounter (Signed)
[place] drug registry verified. Last refill was 04/03/19 # 75 for a 17 day supply. Rx refilled by Dr. Anne Hahn.

## 2019-05-30 ENCOUNTER — Telehealth: Payer: Self-pay | Admitting: Neurology

## 2019-05-30 MED ORDER — HYDROCODONE-ACETAMINOPHEN 5-325 MG PO TABS: 1.0000 | ORAL_TABLET | Freq: Four times a day (QID) | ORAL | 0 refills | Status: DC | PRN

## 2019-05-30 NOTE — Telephone Encounter (Signed)
[place] drug registry has been verified. Last refill was 05/02/19 for a 30 day supply provided by Dr. Jannifer Franklin.

## 2019-05-30 NOTE — Telephone Encounter (Signed)
The hydrocodone will be refilled. 

## 2019-05-30 NOTE — Telephone Encounter (Signed)
Pt is requesting a refill of HYDROcodone-acetaminophen (NORCO/VICODIN) 5-325 MG tablet , [id number] [place], [place] - [address]

## 2019-06-03 ENCOUNTER — Other Ambulatory Visit: Payer: Self-pay | Admitting: Family Medicine

## 2019-06-03 ENCOUNTER — Ambulatory Visit
Admission: RE | Admit: 2019-06-03 | Discharge: 2019-06-03 | Disposition: A | Discharge status: Home / Self Care | Payer: BC Managed Care – PPO | Source: Ambulatory Visit | Attending: Family Medicine | Admitting: Family Medicine

## 2019-06-03 ENCOUNTER — Other Ambulatory Visit: Payer: Self-pay

## 2019-06-03 DIAGNOSIS — R921 Mammographic calcification found on diagnostic imaging of breast: Secondary | ICD-10-CM | POA: Diagnosis not present

## 2019-06-27 ENCOUNTER — Telehealth: Payer: Self-pay | Admitting: Neurology

## 2019-06-27 MED ORDER — HYDROCODONE-ACETAMINOPHEN 5-325 MG PO TABS: 1.0000 | ORAL_TABLET | Freq: Four times a day (QID) | ORAL | 0 refills | Status: DC | PRN

## 2019-06-27 NOTE — Telephone Encounter (Signed)
PT [facility] [address]

## 2019-06-27 NOTE — Telephone Encounter (Signed)
Pt is requesting a refill of HYDROcodone-acetaminophen (NORCO/VICODIN) 5-325 MG tablet, to be sent to HYDROcodone-acetaminophen (NORCO/VICODIN) 5-325 MG tablet

## 2019-06-27 NOTE — Telephone Encounter (Signed)
The hydrocodone will be refilled. 

## 2019-07-15 ENCOUNTER — Other Ambulatory Visit: Payer: Self-pay | Admitting: Family Medicine

## 2019-07-18 DIAGNOSIS — F250 Schizoaffective disorder, bipolar type: Secondary | ICD-10-CM | POA: Diagnosis not present

## 2019-07-19 ENCOUNTER — Ambulatory Visit: Payer: BLUE CROSS/BLUE SHIELD | Admitting: Neurology

## 2019-07-24 ENCOUNTER — Other Ambulatory Visit: Payer: Self-pay | Admitting: Neurology

## 2019-07-24 MED ORDER — HYDROCODONE-ACETAMINOPHEN 5-325 MG PO TABS: 1.0000 | ORAL_TABLET | Freq: Four times a day (QID) | ORAL | 0 refills | Status: DC | PRN

## 2019-07-24 NOTE — Addendum Note (Signed)
Addended by: Hope Pigeon on: 07/24/2019 09:53 AM   Modules accepted: Orders

## 2019-07-24 NOTE — Telephone Encounter (Signed)
Pt is requesting a refill on her HYDROcodone-acetaminophen (NORCO/VICODIN) 5-325 MG tablet [id number] Pt aware this is not due until Friday

## 2019-07-29 ENCOUNTER — Ambulatory Visit: Payer: BC Managed Care – PPO | Admitting: Neurology

## 2019-07-29 ENCOUNTER — Other Ambulatory Visit: Payer: Self-pay

## 2019-07-29 ENCOUNTER — Encounter: Payer: Self-pay | Admitting: Neurology

## 2019-07-29 VITALS — BP 112/69 | HR 73 | Temp 97.7°F | Ht 61.0 in | Wt 106.0 lb

## 2019-07-29 DIAGNOSIS — G43019 Migraine without aura, intractable, without status migrainosus: Secondary | ICD-10-CM

## 2019-07-29 MED ORDER — ZONISAMIDE 100 MG PO CAPS: ORAL_CAPSULE | ORAL | 1 refills | Status: DC

## 2019-07-29 MED ORDER — ONABOTULINUMTOXINA 100 UNITS IJ SOLR
200.0000 [IU] | Freq: Once | INTRAMUSCULAR | Status: AC
  Administered 2019-07-29: 200 [IU] via INTRAMUSCULAR

## 2019-07-29 NOTE — Procedures (Signed)
     BOTOX PROCEDURE NOTE FOR MIGRAINE HEADACHE   HISTORY: [name] is a 53 year old patient with a history of intractable migraine headaches.  She has gained benefit with the frequency and severity of her headaches, she believes that the use of Botox is justified.  She comes in for another treatment for her headaches.   Description of procedure:  The patient was placed in a sitting position. The standard protocol was used for Botox as follows, with 5 units of Botox injected at each site:   -Procerus muscle, midline injection  -Corrugator muscle, bilateral injection  -Frontalis muscle, bilateral injection, with 2 sites each side, medial injection was performed in the upper one third of the frontalis muscle, in the region vertical from the medial inferior edge of the superior orbital rim. The lateral injection was again in the upper one third of the forehead vertically above the lateral limbus of the cornea, 1.5 cm lateral to the medial injection site.  -Temporalis muscle injection, 4 sites, bilaterally. The first injection was 3 cm above the tragus of the ear, second injection site was 1.5 cm to 3 cm up from the first injection site in line with the tragus of the ear. The third injection site was 1.5-3 cm forward between the first 2 injection sites. The fourth injection site was 1.5 cm posterior to the second injection site.  -Occipitalis muscle injection, 3 sites, bilaterally. The first injection was done one half way between the occipital protuberance and the tip of the mastoid process behind the ear. The second injection site was done lateral and superior to the first, 1 fingerbreadth from the first injection. The third injection site was 1 fingerbreadth superiorly and medially from the first injection site.  -Cervical paraspinal muscle injection, 2 sites, bilateral, the first injection site was 1 cm from the midline of the cervical spine, 3 cm inferior to the lower border of the  occipital protuberance. The second injection site was 1.5 cm superiorly and laterally to the first injection site.  -Trapezius muscle injection was performed at 3 sites, bilaterally. The first injection site was in the upper trapezius muscle halfway between the inflection point of the neck, and the acromion. The second injection site was one half way between the acromion and the first injection site. The third injection was done between the first injection site and the inflection point of the neck.   A 200 unit bottle of Botox was used, 155 units were injected, the rest of the Botox was wasted. The patient tolerated the procedure well, there were no complications of the above procedure.  Botox NDC 4782-9562-13 Lot number [id number] Expiration date March 2023

## 2019-08-22 ENCOUNTER — Telehealth: Payer: Self-pay | Admitting: Neurology

## 2019-08-22 MED ORDER — HYDROCODONE-ACETAMINOPHEN 5-325 MG PO TABS: 1.0000 | ORAL_TABLET | Freq: Four times a day (QID) | ORAL | 0 refills | Status: DC | PRN

## 2019-08-22 NOTE — Telephone Encounter (Signed)
The [facility] registry was checked, the hydrocodone prescription will be refilled.

## 2019-08-22 NOTE — Telephone Encounter (Signed)
Pt is needing a refill on her HYDROcodone-acetaminophen (NORCO/VICODIN) 5-325 MG tablet [facility] [address].

## 2019-08-22 NOTE — Addendum Note (Signed)
Addended by: Kathrynn Ducking on: 08/22/2019 05:19 PM   Modules accepted: Orders

## 2019-09-08 ENCOUNTER — Other Ambulatory Visit: Payer: Self-pay | Admitting: Neurology

## 2019-09-09 NOTE — Telephone Encounter (Signed)
[place] drug registry has been verified. Last refill was 06/09/2019 # 180 for a 90 day supply.

## 2019-09-16 ENCOUNTER — Encounter: Payer: Self-pay | Admitting: Family Medicine

## 2019-09-16 ENCOUNTER — Other Ambulatory Visit: Payer: Self-pay

## 2019-09-16 ENCOUNTER — Ambulatory Visit: Payer: BC Managed Care – PPO | Admitting: Family Medicine

## 2019-09-16 VITALS — BP 120/80 | HR 93 | Temp 97.5°F | Ht 61.0 in | Wt 103.2 lb

## 2019-09-16 DIAGNOSIS — F5104 Psychophysiologic insomnia: Secondary | ICD-10-CM | POA: Diagnosis not present

## 2019-09-16 DIAGNOSIS — Z0000 Encounter for general adult medical examination without abnormal findings: Secondary | ICD-10-CM

## 2019-09-16 DIAGNOSIS — G43019 Migraine without aura, intractable, without status migrainosus: Secondary | ICD-10-CM

## 2019-09-16 DIAGNOSIS — Z23 Encounter for immunization: Secondary | ICD-10-CM

## 2019-09-16 DIAGNOSIS — I10 Essential (primary) hypertension: Secondary | ICD-10-CM | POA: Diagnosis not present

## 2019-09-16 MED ORDER — ZOLPIDEM TARTRATE 10 MG PO TABS: 10.0000 mg | ORAL_TABLET | Freq: Every evening | ORAL | 5 refills | Status: DC | PRN

## 2019-09-16 NOTE — Progress Notes (Signed)
   Subjective:    Patient ID: [name], female    DOB: 1966/06/11, 53 y.o.   MRN: [id number]  HPI Here to follow up. She is doing fairly well. She still has migraines about every 3 days, but these are stable. She sleeps well with Ambien. She has complete wellness labs done every year as a courtesy through her husband's employer.    Review of Systems  Constitutional: Negative.   Respiratory: Negative.   Cardiovascular: Negative.   Neurological: Positive for headaches.       Objective:   Physical Exam Constitutional:      Appearance: Normal appearance.  Cardiovascular:     Rate and Rhythm: Normal rate and regular rhythm.     Pulses: Normal pulses.     Heart sounds: Normal heart sounds.  Pulmonary:     Effort: Pulmonary effort is normal.     Breath sounds: Normal breath sounds.  Lymphadenopathy:     Cervical: No cervical adenopathy.  Neurological:     General: No focal deficit present.     Mental Status: She is alert and oriented to person, place, and time.           Assessment & Plan:  Her insomnia is stable. She will arrange for [place] to receive a copy of her latest wellness labs. Set up a colonoscopy soon.  Alysia Penna, MD

## 2019-09-17 ENCOUNTER — Encounter: Payer: Self-pay | Admitting: Internal Medicine

## 2019-09-19 ENCOUNTER — Telehealth: Payer: Self-pay | Admitting: Neurology

## 2019-09-19 MED ORDER — HYDROCODONE-ACETAMINOPHEN 5-325 MG PO TABS: 1.0000 | ORAL_TABLET | Freq: Four times a day (QID) | ORAL | 0 refills | Status: DC | PRN

## 2019-09-19 NOTE — Telephone Encounter (Signed)
The hydrocodone will be refilled. 

## 2019-09-19 NOTE — Telephone Encounter (Signed)
Pt is needing a refill on her HYDROcodone-acetaminophen (NORCO/VICODIN) 5-325 MG tablet [facility] [address].

## 2019-09-23 ENCOUNTER — Telehealth: Payer: Self-pay

## 2019-09-23 NOTE — Telephone Encounter (Signed)
Patient called and stated that when she went to get her Hydrocodone that her prescription was only filled for 24 tablets instead of 75 tablets. I advised the patient that Dr. Krista Blue filled the prescription for 75 tablets on 09/19/2019 due to Dr. Jannifer Franklin being out of the office. Patient stated that she would call the pharmacy.

## 2019-09-23 NOTE — Telephone Encounter (Signed)
Noted  

## 2019-09-27 ENCOUNTER — Other Ambulatory Visit: Payer: Self-pay

## 2019-09-27 ENCOUNTER — Ambulatory Visit (AMBULATORY_SURGERY_CENTER): Payer: Self-pay | Admitting: *Deleted

## 2019-09-27 ENCOUNTER — Encounter: Payer: Self-pay | Admitting: Internal Medicine

## 2019-09-27 VITALS — Temp 97.3°F | Ht 61.0 in | Wt 103.4 lb

## 2019-09-27 DIAGNOSIS — Z1211 Encounter for screening for malignant neoplasm of colon: Secondary | ICD-10-CM

## 2019-09-27 DIAGNOSIS — Z1159 Encounter for screening for other viral diseases: Secondary | ICD-10-CM

## 2019-09-27 NOTE — Progress Notes (Signed)
No egg or soy allergy known to patient  No issues with past sedation with any surgeries  or procedures, no intubation problems  No diet pills per patient No home 02 use per patient  No blood thinners per patient  Pt denies issues with constipation  No A fib or A flutter  EMMI video sent to pt's e mail   Due to the COVID-19 pandemic we are asking patients to follow these guidelines. Please only bring one care partner. Please be aware that your care partner may wait in the car in the parking lot or if they feel like they will be too hot to wait in the car, they may wait in the lobby on the 4th floor. All care partners are required to wear a mask the entire time (we do not have any that we can provide them), they need to practice social distancing, and we will do a Covid check for all patient's and care partners when you arrive. Also we will check their temperature and your temperature. If the care partner waits in their car they need to stay in the parking lot the entire time and we will call them on their cell phone when the patient is ready for discharge so they can bring the car to the front of the building. Also all patient's will need to wear a mask into building.  COVID SCREENING 10/02/19, 11 AM  SAMPLE SHEET PROVIDED FOR OTC MIRIALAX

## 2019-10-02 DIAGNOSIS — Z1159 Encounter for screening for other viral diseases: Secondary | ICD-10-CM | POA: Diagnosis not present

## 2019-10-03 LAB — SARS CORONAVIRUS 2 (TAT 6-24 HRS): SARS Coronavirus 2: NEGATIVE

## 2019-10-04 ENCOUNTER — Telehealth: Payer: Self-pay | Admitting: General Practice

## 2019-10-04 NOTE — Telephone Encounter (Signed)
Negative COVID results given. Patient results "NOT Detected." Caller expressed understanding. ° °

## 2019-10-07 ENCOUNTER — Ambulatory Visit (AMBULATORY_SURGERY_CENTER): Payer: BC Managed Care – PPO | Admitting: Internal Medicine

## 2019-10-07 ENCOUNTER — Encounter: Payer: Self-pay | Admitting: Internal Medicine

## 2019-10-07 ENCOUNTER — Other Ambulatory Visit: Payer: Self-pay

## 2019-10-07 VITALS — BP 110/66 | HR 68 | Temp 98.4°F | Resp 14 | Ht 61.0 in | Wt 103.4 lb

## 2019-10-07 DIAGNOSIS — Z1211 Encounter for screening for malignant neoplasm of colon: Secondary | ICD-10-CM | POA: Diagnosis not present

## 2019-10-07 HISTORY — PX: COLONOSCOPY: SHX174

## 2019-10-07 MED ORDER — SODIUM CHLORIDE 0.9 % IV SOLN: 500.0000 mL | Freq: Once | INTRAVENOUS | Status: DC

## 2019-10-07 NOTE — Patient Instructions (Addendum)
No polyps or cancer seen.  Next routine colonoscopy or other screening test in 10 years - 2030  I appreciate the opportunity to care for you. Gatha Mayer, MD, FACG   YOU HAD AN ENDOSCOPIC PROCEDURE TODAY AT [facility]:   Refer to the procedure report that was given to you for any specific questions about what was found during the examination.  If the procedure report does not answer your questions, please call your gastroenterologist to clarify.  If you requested that your care partner not be given the details of your procedure findings, then the procedure report has been included in a sealed envelope for you to review at your convenience later.  YOU SHOULD EXPECT: Some feelings of bloating in the abdomen. Passage of more gas than usual.  Walking can help get rid of the air that was put into your GI tract during the procedure and reduce the bloating. If you had a lower endoscopy (such as a colonoscopy or flexible sigmoidoscopy) you may notice spotting of blood in your stool or on the toilet paper. If you underwent a bowel prep for your procedure, you may not have a normal bowel movement for a few days.  Please Note:  You might notice some irritation and congestion in your nose or some drainage.  This is from the oxygen used during your procedure.  There is no need for concern and it should clear up in a day or so.  SYMPTOMS TO REPORT IMMEDIATELY:   Following lower endoscopy (colonoscopy or flexible sigmoidoscopy):  Excessive amounts of blood in the stool  Significant tenderness or worsening of abdominal pains  Swelling of the abdomen that is new, acute  Fever of 100F or higher   For urgent or emergent issues, a gastroenterologist can be reached at any hour by calling [phone].   DIET:  We do recommend a small meal at first, but then you may proceed to your regular diet.  Drink plenty of fluids but you should avoid alcoholic beverages for 24 hours.  ACTIVITY:   You should plan to take it easy for the rest of today and you should NOT DRIVE or use heavy machinery until tomorrow (because of the sedation medicines used during the test).    FOLLOW UP: Our staff will call the number listed on your records 48-72 hours following your procedure to check on you and address any questions or concerns that you may have regarding the information given to you following your procedure. If we do not reach you, we will leave a message.  We will attempt to reach you two times.  During this call, we will ask if you have developed any symptoms of COVID 19. If you develop any symptoms (ie: fever, flu-like symptoms, shortness of breath, cough etc.) before then, please call [phone].  If you test positive for Covid 19 in the 2 weeks post procedure, please call and report this information to [place].    If any biopsies were taken you will be contacted by phone or by letter within the next 1-3 weeks.  Please call us at [phone] if you have not heard about the biopsies in 3 weeks.    SIGNATURES/CONFIDENTIALITY: You and/or your care partner have signed paperwork which will be entered into your electronic medical record.  These signatures attest to the fact that that the information above on your After Visit Summary has been reviewed and is understood.  Full responsibility of the confidentiality of this discharge  information lies with you and/or your care-partner. 

## 2019-10-07 NOTE — Op Note (Signed)
[facility] Patient Name: [name] Procedure Date: 10/07/2019 2:23 PM MRN: [id number] Endoscopist: Iva Boop , MD Age: 53 Referring MD:  Date of Birth: 11-19-1966 Gender: Female Account #: [id number] Procedure:                Colonoscopy Indications:              Screening for colorectal malignant neoplasm Medicines:                Propofol per Anesthesia, Monitored Anesthesia Care Procedure:                Pre-Anesthesia Assessment:                           - Prior to the procedure, a History and Physical                            was performed, and patient medications and                            allergies were reviewed. The patient's tolerance of                            previous anesthesia was also reviewed. The risks                            and benefits of the procedure and the sedation                            options and risks were discussed with the patient.                            All questions were answered, and informed consent                            was obtained. Prior Anticoagulants: The patient has                            taken no previous anticoagulant or antiplatelet                            agents. ASA Grade Assessment: III - A patient with                            severe systemic disease. After reviewing the risks                            and benefits, the patient was deemed in                            satisfactory condition to undergo the procedure.                           After obtaining informed consent, the colonoscope  was passed under direct vision. Throughout the                            procedure, the patient's blood pressure, pulse, and                            oxygen saturations were monitored continuously. The                            Colonoscope was introduced through the anus and                            advanced to the the cecum, identified by      appendiceal orifice and ileocecal valve. The                            colonoscopy was performed without difficulty. The                            patient tolerated the procedure well. The quality                            of the bowel preparation was good. The bowel                            preparation used was Miralax via split dose                            instruction. The ileocecal valve, appendiceal                            orifice, and rectum were photographed. Scope In: 2:38:38 PM Scope Out: 2:50:43 PM Scope Withdrawal Time: 0 hours 8 minutes 51 seconds  Total Procedure Duration: 0 hours 12 minutes 5 seconds  Findings:                 Skin tags were found on perianal exam and                            retroflexion.                           The exam was otherwise without abnormality on                            direct and retroflexion views. Complications:            No immediate complications. Estimated Blood Loss:     Estimated blood loss: none. Impression:               - Perianal skin tags found on perianal exam.                           - The examination was otherwise normal on direct  and retroflexion views except for hemorrhoid                            banding scars in the rectum.                           - No specimens collected. Recommendation:           - Patient has a contact number available for                            emergencies. The signs and symptoms of potential                            delayed complications were discussed with the                            patient. Return to normal activities tomorrow.                            Written discharge instructions were provided to the                            patient.                           - Resume previous diet.                           - Continue present medications.                           - Repeat colonoscopy in 10 years for screening                             purposes. Gatha Mayer, MD 10/07/2019 2:57:55 PM This report has been signed electronically.

## 2019-10-07 NOTE — Progress Notes (Signed)
Report given to PACU, vss 

## 2019-10-07 NOTE — Progress Notes (Signed)
LC - Temp KA - VS   Pt's states no medical or surgical changes since previsit or office visit.   

## 2019-10-09 ENCOUNTER — Telehealth: Payer: Self-pay

## 2019-10-09 NOTE — Telephone Encounter (Signed)
  Follow up Call-  Call back number 10/07/2019  Post procedure Call Back phone  # [phone]  Permission to leave phone message Yes  Some recent data might be hidden     Patient questions:  Do you have a fever, pain , or abdominal swelling? No. Pain Score  0 *  Have you tolerated food without any problems? Yes.    Have you been able to return to your normal activities? Yes.    Do you have any questions about your discharge instructions: Diet   No. Medications  No. Follow up visit  No.  Do you have questions or concerns about your Care? No.  Actions: * If pain score is 4 or above: No action needed, pain <4. 1. Have you developed a fever since your procedure? no  2.   Have you had an respiratory symptoms (SOB or cough) since your procedure? no  3.   Have you tested positive for COVID 19 since your procedure no  4.   Have you had any family members/close contacts diagnosed with the COVID 19 since your procedure?  no   If yes to any of these questions please route to Joylene John, RN and Alphonsa Gin, Therapist, sports.

## 2019-10-17 ENCOUNTER — Telehealth: Payer: Self-pay | Admitting: Neurology

## 2019-10-17 MED ORDER — HYDROCODONE-ACETAMINOPHEN 5-325 MG PO TABS: 1.0000 | ORAL_TABLET | Freq: Four times a day (QID) | ORAL | 0 refills | Status: DC | PRN

## 2019-10-17 NOTE — Telephone Encounter (Signed)
Pt is needing a refill on her HYDROcodone-acetaminophen (NORCO/VICODIN) 5-325 MG [facility] [address] 

## 2019-10-17 NOTE — Telephone Encounter (Signed)
The hydrocodone prescription was sent in. 

## 2019-11-12 ENCOUNTER — Encounter: Payer: Self-pay | Admitting: Neurology

## 2019-11-12 ENCOUNTER — Other Ambulatory Visit: Payer: Self-pay

## 2019-11-12 ENCOUNTER — Ambulatory Visit (INDEPENDENT_AMBULATORY_CARE_PROVIDER_SITE_OTHER): Payer: BC Managed Care – PPO | Admitting: Neurology

## 2019-11-12 VITALS — BP 143/78 | HR 76 | Temp 97.7°F

## 2019-11-12 DIAGNOSIS — G43019 Migraine without aura, intractable, without status migrainosus: Secondary | ICD-10-CM

## 2019-11-12 MED ORDER — ONABOTULINUMTOXINA 100 UNITS IJ SOLR
200.0000 [IU] | Freq: Once | INTRAMUSCULAR | Status: AC
  Administered 2019-11-12: 200 [IU] via INTRAMUSCULAR

## 2019-11-12 NOTE — Procedures (Signed)
     BOTOX PROCEDURE NOTE FOR MIGRAINE HEADACHE   HISTORY: [name] is a 53 year old patient with a history of intractable migraine headache.  She will have a severe incapacitating headache least once a week.  She does believe that Botox has helped the severity and frequency of the headaches, she wishes to continue this therapy.   Description of procedure:  The patient was placed in a sitting position. The standard protocol was used for Botox as follows, with 5 units of Botox injected at each site:   -Procerus muscle, midline injection  -Corrugator muscle, bilateral injection  -Frontalis muscle, bilateral injection, with 2 sites each side, medial injection was performed in the upper one third of the frontalis muscle, in the region vertical from the medial inferior edge of the superior orbital rim. The lateral injection was again in the upper one third of the forehead vertically above the lateral limbus of the cornea, 1.5 cm lateral to the medial injection site.  -Temporalis muscle injection, 4 sites, bilaterally. The first injection was 3 cm above the tragus of the ear, second injection site was 1.5 cm to 3 cm up from the first injection site in line with the tragus of the ear. The third injection site was 1.5-3 cm forward between the first 2 injection sites. The fourth injection site was 1.5 cm posterior to the second injection site.  -Occipitalis muscle injection, 3 sites, bilaterally. The first injection was done one half way between the occipital protuberance and the tip of the mastoid process behind the ear. The second injection site was done lateral and superior to the first, 1 fingerbreadth from the first injection. The third injection site was 1 fingerbreadth superiorly and medially from the first injection site.  -Cervical paraspinal muscle injection, 2 sites, bilateral, the first injection site was 1 cm from the midline of the cervical spine, 3 cm inferior to the lower border  of the occipital protuberance. The second injection site was 1.5 cm superiorly and laterally to the first injection site.  -Trapezius muscle injection was performed at 3 sites, bilaterally. The first injection site was in the upper trapezius muscle halfway between the inflection point of the neck, and the acromion. The second injection site was one half way between the acromion and the first injection site. The third injection was done between the first injection site and the inflection point of the neck.   A 200 unit bottle of Botox was used, 155 units were injected, the rest of the Botox was wasted. The patient tolerated the procedure well, there were no complications of the above procedure.  Botox NDC 0932-3557-32 Lot number [id number] Expiration date August 2023

## 2019-11-12 NOTE — Progress Notes (Signed)
The patient comes in today for Botox injection. 

## 2019-11-13 ENCOUNTER — Telehealth: Payer: Self-pay

## 2019-11-13 NOTE — Telephone Encounter (Signed)
Called and informed pt of next Botox appt. Pt verbalized appreciation.

## 2019-11-13 NOTE — Telephone Encounter (Signed)
Please call and confirm she is aware of next Botox injection. I made a card for her but Dr. Jannifer Franklin sent a note asking me to call her so we just want to double check.

## 2019-11-14 ENCOUNTER — Other Ambulatory Visit: Payer: Self-pay | Admitting: Neurology

## 2019-11-14 MED ORDER — HYDROCODONE-ACETAMINOPHEN 5-325 MG PO TABS: 1.0000 | ORAL_TABLET | Freq: Four times a day (QID) | ORAL | 0 refills | Status: DC | PRN

## 2019-12-04 ENCOUNTER — Other Ambulatory Visit: Payer: Self-pay

## 2019-12-04 ENCOUNTER — Ambulatory Visit
Admission: RE | Admit: 2019-12-04 | Discharge: 2019-12-04 | Disposition: A | Discharge status: Home / Self Care | Payer: BC Managed Care – PPO | Source: Ambulatory Visit | Attending: Family Medicine | Admitting: Family Medicine

## 2019-12-04 DIAGNOSIS — R921 Mammographic calcification found on diagnostic imaging of breast: Secondary | ICD-10-CM | POA: Diagnosis not present

## 2019-12-11 ENCOUNTER — Other Ambulatory Visit: Payer: Self-pay | Admitting: Neurology

## 2019-12-12 ENCOUNTER — Other Ambulatory Visit: Payer: Self-pay

## 2019-12-12 NOTE — Telephone Encounter (Signed)
1) Medication(s) Requested (by name): HYDROcodone-acetaminophen (NORCO/VICODIN) 5-325 MG tablet   2) [facility]: [id number] [place], [place] - [address]  [address], [place] [address]      

## 2019-12-13 MED ORDER — HYDROCODONE-ACETAMINOPHEN 5-325 MG PO TABS: 1.0000 | ORAL_TABLET | Freq: Four times a day (QID) | ORAL | 0 refills | Status: DC | PRN

## 2020-01-13 ENCOUNTER — Telehealth: Payer: Self-pay | Admitting: Neurology

## 2020-01-13 MED ORDER — HYDROCODONE-ACETAMINOPHEN 5-325 MG PO TABS: 1.0000 | ORAL_TABLET | Freq: Four times a day (QID) | ORAL | 0 refills | Status: DC | PRN

## 2020-01-13 NOTE — Telephone Encounter (Signed)
Drug registry check last refill 12/13/2019 and prescribed by Dr Epimenio Foot work in [place].

## 2020-01-13 NOTE — Telephone Encounter (Signed)
Pt is needing a refill on her HYDROcodone-acetaminophen (NORCO/VICODIN) 5-325 MG [facility] [address] 

## 2020-02-10 ENCOUNTER — Other Ambulatory Visit: Payer: Self-pay | Admitting: Neurology

## 2020-02-10 ENCOUNTER — Telehealth: Payer: Self-pay | Admitting: Neurology

## 2020-02-10 MED ORDER — HYDROCODONE-ACETAMINOPHEN 5-325 MG PO TABS: 1.0000 | ORAL_TABLET | Freq: Four times a day (QID) | ORAL | 0 refills | Status: DC | PRN

## 2020-02-10 NOTE — Telephone Encounter (Signed)
Done. thanks

## 2020-02-10 NOTE — Telephone Encounter (Signed)
1) Medication(s) Requested (by name): HYDROcodone-acetaminophen (NORCO/VICODIN) 5-325 MG tablet   2) Pharmacy [facility] Choice: [id number] [place], [place] - [address]  [address] [place], [place] [address]

## 2020-02-10 NOTE — Telephone Encounter (Signed)
Medication last refill 01/13/2020 per drug registry.

## 2020-02-18 ENCOUNTER — Ambulatory Visit: Payer: BC Managed Care – PPO | Admitting: Neurology

## 2020-02-18 ENCOUNTER — Encounter: Payer: Self-pay | Admitting: Neurology

## 2020-02-18 ENCOUNTER — Other Ambulatory Visit: Payer: Self-pay

## 2020-02-18 ENCOUNTER — Telehealth: Payer: Self-pay

## 2020-02-18 VITALS — BP 148/88 | HR 82 | Temp 97.8°F | Wt 99.0 lb

## 2020-02-18 DIAGNOSIS — G43711 Chronic migraine without aura, intractable, with status migrainosus: Secondary | ICD-10-CM

## 2020-02-18 MED ORDER — ZONISAMIDE 100 MG PO CAPS: ORAL_CAPSULE | ORAL | 1 refills | Status: DC

## 2020-02-18 MED ORDER — ONABOTULINUMTOXINA 100 UNITS IJ SOLR
100.0000 [IU] | Freq: Once | INTRAMUSCULAR | Status: AC
  Administered 2020-02-18: 13:00:00 100 [IU] via INTRAMUSCULAR

## 2020-02-18 NOTE — Progress Notes (Signed)
Botox 100 units Lot number [id number] 10 2023 Botox 100 units lot number [id number]  expires 11 [address] [place]  [id number]

## 2020-02-18 NOTE — Telephone Encounter (Signed)
Pt was already schedule for her botox injection for July 2021. I gave her time and date of next appt appt because she had already left. PT stated she cannot do the 730am appt time in July 2021. I stated message will be sent to botox staff. Pt verbalized understanding.

## 2020-02-18 NOTE — Progress Notes (Signed)
Please refer to Botox procedure note.  The patient comes in today indicating a normal weight loss over the last several months.  She has gone from 111 pounds 1.5 years ago to 99 pounds now.  She is on Zonegran, we will cut back on the dose to taking 2 in the morning and 3 in the evening.  She will try to supplement with boost or Ensure with the diet.

## 2020-02-18 NOTE — Procedures (Signed)
     BOTOX PROCEDURE NOTE FOR MIGRAINE HEADACHE   HISTORY: [name] is a 54 year old patient with a history of intractable migraine headache.  She has gained benefit with the Botox injections.  She comes in for another injection today.   Description of procedure:  The patient was placed in a sitting position. The standard protocol was used for Botox as follows, with 5 units of Botox injected at each site:   -Procerus muscle, midline injection  -Corrugator muscle, bilateral injection  -Frontalis muscle, bilateral injection, with 2 sites each side, medial injection was performed in the upper one third of the frontalis muscle, in the region vertical from the medial inferior edge of the superior orbital rim. The lateral injection was again in the upper one third of the forehead vertically above the lateral limbus of the cornea, 1.5 cm lateral to the medial injection site.  -Temporalis muscle injection, 4 sites, bilaterally. The first injection was 3 cm above the tragus of the ear, second injection site was 1.5 cm to 3 cm up from the first injection site in line with the tragus of the ear. The third injection site was 1.5-3 cm forward between the first 2 injection sites. The fourth injection site was 1.5 cm posterior to the second injection site.  -Occipitalis muscle injection, 3 sites, bilaterally. The first injection was done one half way between the occipital protuberance and the tip of the mastoid process behind the ear. The second injection site was done lateral and superior to the first, 1 fingerbreadth from the first injection. The third injection site was 1 fingerbreadth superiorly and medially from the first injection site.  -Cervical paraspinal muscle injection, 2 sites, bilateral, the first injection site was 1 cm from the midline of the cervical spine, 3 cm inferior to the lower border of the occipital protuberance. The second injection site was 1.5 cm superiorly and laterally  to the first injection site.  -Trapezius muscle injection was performed at 3 sites, bilaterally. The first injection site was in the upper trapezius muscle halfway between the inflection point of the neck, and the acromion. The second injection site was one half way between the acromion and the first injection site. The third injection was done between the first injection site and the inflection point of the neck.   A 200 unit bottle of Botox was used, 155 units were injected, the rest of the Botox was wasted. The patient tolerated the procedure well, there were no complications of the above procedure.  Botox NDC 9702-6378-58 Lot number [id number] Expiration date November 2023

## 2020-02-25 DIAGNOSIS — F250 Schizoaffective disorder, bipolar type: Secondary | ICD-10-CM | POA: Diagnosis not present

## 2020-03-09 ENCOUNTER — Telehealth: Payer: Self-pay | Admitting: Neurology

## 2020-03-09 ENCOUNTER — Other Ambulatory Visit: Payer: Self-pay | Admitting: Neurology

## 2020-03-09 MED ORDER — HYDROCODONE-ACETAMINOPHEN 5-325 MG PO TABS: 1.0000 | ORAL_TABLET | Freq: Four times a day (QID) | ORAL | 0 refills | Status: DC | PRN

## 2020-03-09 NOTE — Telephone Encounter (Signed)
Pt called and LVM stating she is needing a refill on her HYDROcodone-acetaminophen (NORCO/VICODIN) 5-325 MG [facility] [address]

## 2020-03-09 NOTE — Telephone Encounter (Signed)
Drug registry check last refill 02/10/2020 by work in [place]. PT needs refill

## 2020-03-09 NOTE — Telephone Encounter (Signed)
Done. -VRP 

## 2020-03-27 DIAGNOSIS — H524 Presbyopia: Secondary | ICD-10-CM | POA: Diagnosis not present

## 2020-04-01 ENCOUNTER — Other Ambulatory Visit: Payer: Self-pay | Admitting: Family Medicine

## 2020-04-01 NOTE — Telephone Encounter (Signed)
Last filled 10/13/2019 Last OV 09/16/2019  Ok to fill?

## 2020-04-01 NOTE — Telephone Encounter (Signed)
zolpidem (AMBIEN) 10 MG tablet   [id number] [place], [place] - [address] Phone:  [phone]  Fax:  [phone]

## 2020-04-06 ENCOUNTER — Telehealth: Payer: Self-pay | Admitting: Neurology

## 2020-04-06 MED ORDER — HYDROCODONE-ACETAMINOPHEN 5-325 MG PO TABS: 1.0000 | ORAL_TABLET | Freq: Four times a day (QID) | ORAL | 0 refills | Status: DC | PRN

## 2020-04-06 NOTE — Addendum Note (Signed)
Addended by: Huston Foley on: 04/06/2020 09:15 AM   Modules accepted: Orders

## 2020-04-06 NOTE — Telephone Encounter (Signed)
1) Medication(s) Requested (by name): HYDROcodone-acetaminophen (NORCO/VICODIN) 5-325 MG tablet   2) Pharmacy [facility]: [id number] [place], [place] - [address] AT [address]  [address] [place], [place] [address]

## 2020-04-15 ENCOUNTER — Other Ambulatory Visit: Payer: Self-pay

## 2020-04-16 ENCOUNTER — Ambulatory Visit (INDEPENDENT_AMBULATORY_CARE_PROVIDER_SITE_OTHER): Payer: BC Managed Care – PPO | Admitting: Family Medicine

## 2020-04-16 ENCOUNTER — Encounter: Payer: Self-pay | Admitting: Family Medicine

## 2020-04-16 VITALS — BP 130/62 | HR 88 | Temp 97.8°F | Wt 95.8 lb

## 2020-04-16 DIAGNOSIS — R1310 Dysphagia, unspecified: Secondary | ICD-10-CM

## 2020-04-16 DIAGNOSIS — Z0000 Encounter for general adult medical examination without abnormal findings: Secondary | ICD-10-CM

## 2020-04-16 NOTE — Progress Notes (Signed)
Subjective:    Patient ID: [name], female    DOB: June 01, 1966, 54 y.o.   MRN: [id number]  HPI Here for a well exam. She has only one complaint today. For the past month she has had trouble swallowing where the food stops part way down and then moves down after a few minutes. There is no pain. Sometimes this chokes her and she feels like she will vomit (she does not). She sees Pauline Good NP for her depression and she sees Dr. Jannifer Franklin for headaches and for seizures. She is currently getting Botox treatments with mixed results. She had full fasting labs done through her husband's work [employer], retail about 3 months ago, and she says these were all normal.    Review of Systems  Constitutional: Negative.   HENT: Positive for trouble swallowing.   Eyes: Negative.   Respiratory: Negative.   Cardiovascular: Negative.   Gastrointestinal: Negative.   Genitourinary: Negative for decreased urine volume, difficulty urinating, dyspareunia, dysuria, enuresis, flank pain, frequency, hematuria, pelvic pain and urgency.  Musculoskeletal: Negative.   Skin: Negative.   Neurological: Positive for headaches.  Psychiatric/Behavioral: Positive for dysphoric mood and sleep disturbance. The patient is nervous/anxious.        Objective:   Physical Exam Constitutional:      General: She is not in acute distress.    Appearance: She is well-developed.  HENT:     Head: Normocephalic and atraumatic.     Right Ear: External ear normal.     Left Ear: External ear normal.     Nose: Nose normal.     Mouth/Throat:     Pharynx: No oropharyngeal exudate.  Eyes:     General: No scleral icterus.    Conjunctiva/sclera: Conjunctivae normal.     Pupils: Pupils are equal, round, and reactive to light.  Neck:     Thyroid: No thyromegaly.     Vascular: No JVD.  Cardiovascular:     Rate and Rhythm: Normal rate and regular rhythm.     Heart sounds: Normal heart sounds. No murmur. No friction rub. No gallop.     Pulmonary:     Effort: Pulmonary effort is normal. No respiratory distress.     Breath sounds: Normal breath sounds. No wheezing or rales.  Chest:     Chest wall: No tenderness.  Abdominal:     General: Bowel sounds are normal. There is no distension.     Palpations: Abdomen is soft. There is no mass.     Tenderness: There is no abdominal tenderness. There is no guarding or rebound.  Musculoskeletal:        General: No tenderness. Normal range of motion.     Cervical back: Normal range of motion and neck supple.  Lymphadenopathy:     Cervical: No cervical adenopathy.  Skin:    General: Skin is warm and dry.     Findings: No erythema or rash.  Neurological:     Mental Status: She is alert and oriented to person, place, and time.     Cranial Nerves: No cranial nerve deficit.     Motor: No abnormal muscle tone.     Coordination: Coordination normal.     Deep Tendon Reflexes: Reflexes are normal and symmetric. Reflexes normal.  Psychiatric:        Behavior: Behavior normal.        Thought Content: Thought content normal.        Judgment: Judgment normal.  Assessment & Plan:  Well exam. We discussed diet and exercise. I asked her to forward me a copy of the wellness lab results mentioned above. We will refer her to Dr. Leone Payor about the dysphagia, she may need upper endoscopy with dilation.  Gershon Crane, MD

## 2020-05-05 ENCOUNTER — Telehealth: Payer: Self-pay | Admitting: Neurology

## 2020-05-05 MED ORDER — HYDROCODONE-ACETAMINOPHEN 5-325 MG PO TABS: 1.0000 | ORAL_TABLET | Freq: Four times a day (QID) | ORAL | 0 refills | Status: DC | PRN

## 2020-05-05 NOTE — Telephone Encounter (Signed)
Pt called needing a refill on her HYDROcodone-acetaminophen (NORCO/VICODIN) 5-325 MG [facility] [address] 

## 2020-05-05 NOTE — Telephone Encounter (Signed)
Drug registry check last refill Apr 06, 2020. Pt needs refill for 3 days.

## 2020-05-05 NOTE — Addendum Note (Signed)
Addended by: Despina Arias A on: 05/05/2020 04:10 PM   Modules accepted: Orders

## 2020-05-15 ENCOUNTER — Ambulatory Visit: Payer: BC Managed Care – PPO | Admitting: Internal Medicine

## 2020-05-15 ENCOUNTER — Encounter: Payer: Self-pay | Admitting: Internal Medicine

## 2020-05-15 VITALS — BP 124/74 | HR 76 | Ht 61.0 in | Wt 98.0 lb

## 2020-05-15 DIAGNOSIS — R1310 Dysphagia, unspecified: Secondary | ICD-10-CM

## 2020-05-15 DIAGNOSIS — R1319 Other dysphagia: Secondary | ICD-10-CM

## 2020-05-15 NOTE — Progress Notes (Signed)
[name] 54 y.o. 1966-04-01 [id number]  Assessment & Plan:   Encounter Diagnosis  Name Primary?  . Esophageal dysphagia Yes   Evaluate with EGD.   Possible esophageal dilation. Keep in mind the possibility of motility disturbance.  It seems unlikely relative to what start bothering her 3 years and to treatment but it remains possible.  She reports that that has been a very effective medication for her and she would not want to stop it.  The risks and benefits as well as alternatives of endoscopic procedure(s) have been discussed and reviewed. All questions answered. The patient agrees to proceed.   Subjective:   Chief Complaint: Dysphagia  HPI [name] is a 54 year old white woman with a history of epilepsy and depression previously seen for hemorrhoids and screening colonoscopy who is describing a 24-month history of intermittent solid food dysphagia.  She will sometimes regurgitate food though I do not think she has bad impact dysphagia based upon what she is telling me.  No liquid dysphagia at all. No heartburn to speak of. She reports that she has seen that [place] which she has been on for 3 years may cause dysphagia.  Wt Readings from Last 3 Encounters:  05/15/20 98 lb (44.5 kg)  04/16/20 95 lb 12.8 oz (43.5 kg)  02/18/20 99 lb (44.9 kg)   Epilepsy is under control with no seizures in greater than 3 months.    Allergies  Allergen Reactions  . Vimpat [Lacosamide]     Increased depression   Current Meds  Medication Sig  . clindamycin (CLEOCIN) 150 MG capsule Take 150 mg by mouth 4 (four) times daily.  Marland Kitchen HYDROcodone-acetaminophen (NORCO/VICODIN) 5-325 MG tablet Take 1 tablet by mouth every 6 (six) hours as needed for moderate pain. Prescription must last 30 or more days  . lurasidone (LATUDA) 80 MG TABS tablet Take 80 mg by mouth at bedtime.  . pregabalin (LYRICA) 150 MG capsule TAKE 1 CAPSULE BY MOUTH TWICE A DAY  . PREMARIN 0.625 MG tablet TAKE 1 TABLET  BY MOUTH EVERY DAY FOR 21 DAYS THEN DO NOT TAKE FOR 7 DAYS  . zolpidem (AMBIEN) 10 MG tablet TAKE 1 TABLET (10 MG TOTAL) BY MOUTH AT BEDTIME AS NEEDED.  Marland Kitchen zonisamide (ZONEGRAN) 100 MG capsule 2 capsules in the morning, 3 in the evening   Past Medical History:  Diagnosis Date  . Anxiety   . Arthritis   . Chronic constipation 01/10/2014  . Depression    sees Pauline Good NP   . Epilepsy ([place])    seizures, sees Dr. Jannifer Franklin  . Gallstones   . Headache(784.0)    migraine  . Headache, common migraine, intractable 09/09/2015  . Hemorrhoids   . Hypertension   . Insomnia   . Obsessive compulsive disorder   . Suicidal ideation    Past Surgical History:  Procedure Laterality Date  . ABDOMINAL HYSTERECTOMY  2001  . APPENDECTOMY Right    with ovarian cyst removal  . BREAST EXCISIONAL BIOPSY     left 2008  . BREAST LUMPECTOMY Left 01/2007   Dr. Zella Richer  . CESAREAN SECTION    . CHOLECYSTECTOMY    . COLONOSCOPY  10/07/2019   per Dr. Carlean Purl, no polyps, repeat in 10 yrs   . HEMORRHOID BANDING  2015  . TONSILLECTOMY     Social History   Social History Narrative   Married 2 sons homemaker   4-5 caffeinated beverages daily (Pepsi)   Patient is right handed.   family  history includes Cirrhosis in her mother; HIV in her brother; Heart disease in her father and mother; Hypertension in her father; Pancreatic cancer in her maternal uncle; Stomach cancer in her mother; Thyroid cancer in her brother.   Review of Systems As above Objective:   Physical Exam @BP  124/74 (BP Location: Left Arm, Patient Position: Sitting, Cuff Size: Normal)   Pulse 76   Ht 5\' 1"  (1.549 m)   Wt 98 lb (44.5 kg)   SpO2 100%   BMI 18.52 kg/m @  General:  NAD Eyes:   Anicteric Neck:  Supple no TM, mass, LA Lungs:  clear Heart::  S1S2 no rubs, murmurs or gallops Abdomen:  soft and nontender, BS+    Data Reviewed:   As above

## 2020-05-15 NOTE — Patient Instructions (Addendum)
  If you are age 54 or younger, your body mass index should be between 19-25. Your Body mass index is 18.52 kg/m. If this is out of the aformentioned range listed, please consider follow up with your Primary Care Provider.   You have been scheduled for an endoscopy. Please follow written instructions given to you at your visit today. If you use inhalers (even only as needed), please bring them with you on the day of your procedure.  Due to recent changes in healthcare laws, you may see the results of your imaging and laboratory studies on MyChart before your provider has had a chance to review them.  We understand that in some cases there may be results that are confusing or concerning to you. Not all laboratory results come back in the same time frame and the provider may be waiting for multiple results in order to interpret others.  Please give [place] 48 hours in order for your provider to thoroughly review all the results before contacting the office for clarification of your results.   Thank you for entrusting me with your care and choosing [facility].  Stan Head, MD, [facility]

## 2020-05-21 ENCOUNTER — Encounter: Payer: Self-pay | Admitting: Internal Medicine

## 2020-06-01 ENCOUNTER — Other Ambulatory Visit: Payer: Self-pay | Admitting: Diagnostic Neuroimaging

## 2020-06-02 ENCOUNTER — Encounter: Payer: BC Managed Care – PPO | Admitting: Internal Medicine

## 2020-06-10 ENCOUNTER — Ambulatory Visit: Payer: BC Managed Care – PPO | Admitting: Neurology

## 2020-06-10 ENCOUNTER — Telehealth: Payer: Self-pay

## 2020-06-10 MED ORDER — HYDROCODONE-ACETAMINOPHEN 5-325 MG PO TABS: 1.0000 | ORAL_TABLET | Freq: Four times a day (QID) | ORAL | 0 refills | Status: DC | PRN

## 2020-06-10 NOTE — Telephone Encounter (Signed)
The [facility] registry was checked, the hydrocodone will be refilled.

## 2020-06-10 NOTE — Telephone Encounter (Signed)
Pt called needing a refill on her HYDROcodone-acetaminophen (NORCO/VICODIN) 5-325 MG [facility] [address]

## 2020-06-10 NOTE — Addendum Note (Signed)
Addended by: York Spaniel on: 06/10/2020 04:33 PM   Modules accepted: Orders

## 2020-07-09 ENCOUNTER — Telehealth: Payer: Self-pay | Admitting: Neurology

## 2020-07-09 MED ORDER — HYDROCODONE-ACETAMINOPHEN 5-325 MG PO TABS: 1.0000 | ORAL_TABLET | Freq: Four times a day (QID) | ORAL | 0 refills | Status: DC | PRN

## 2020-07-09 NOTE — Telephone Encounter (Signed)
The hydrocodone will be refilled. 

## 2020-07-09 NOTE — Telephone Encounter (Signed)
Pt is needing a refill on her HYDROcodone-acetaminophen (NORCO/VICODIN) 5-325 MG tablet [facility] [address]

## 2020-08-06 ENCOUNTER — Other Ambulatory Visit: Payer: Self-pay | Admitting: Neurology

## 2020-08-06 MED ORDER — HYDROCODONE-ACETAMINOPHEN 5-325 MG PO TABS
1.0000 | ORAL_TABLET | Freq: Four times a day (QID) | ORAL | 0 refills | Status: DC | PRN
Start: 2020-08-06 — End: 2020-09-07

## 2020-08-06 NOTE — Telephone Encounter (Signed)
Per [place] registry, last filled on 07/09/2020 Hydrocodone-Acetamin 5-325 Mg #75 for 30 days. Will send refill request to Dr Anne Hahn to authorize for fill on 08/09/20.

## 2020-08-06 NOTE — Telephone Encounter (Signed)
Pt is requesting a refill for HYDROcodone-acetaminophen (NORCO/VICODIN) 5-325 MG tablet.  [phone]

## 2020-08-06 NOTE — Addendum Note (Signed)
Addended by: Bertram Savin on: 08/06/2020 05:09 PM   Modules accepted: Orders

## 2020-08-12 ENCOUNTER — Other Ambulatory Visit: Payer: Self-pay | Admitting: *Deleted

## 2020-08-12 DIAGNOSIS — G43711 Chronic migraine without aura, intractable, with status migrainosus: Secondary | ICD-10-CM

## 2020-08-12 MED ORDER — ONABOTULINUMTOXINA 100 UNITS IJ SOLR: INTRAMUSCULAR | 3 refills | Status: DC

## 2020-08-18 ENCOUNTER — Other Ambulatory Visit: Payer: Self-pay | Admitting: Neurology

## 2020-08-18 ENCOUNTER — Encounter: Payer: Self-pay | Admitting: Neurology

## 2020-08-18 ENCOUNTER — Ambulatory Visit: Payer: BC Managed Care – PPO | Admitting: Neurology

## 2020-08-18 ENCOUNTER — Other Ambulatory Visit: Payer: Self-pay

## 2020-08-18 VITALS — BP 118/71 | HR 75 | Ht 61.0 in | Wt 98.0 lb

## 2020-08-18 DIAGNOSIS — G43019 Migraine without aura, intractable, without status migrainosus: Secondary | ICD-10-CM

## 2020-08-18 MED ORDER — PREGABALIN 150 MG PO CAPS
150.0000 mg | ORAL_CAPSULE | Freq: Two times a day (BID) | ORAL | 1 refills | Status: DC
Start: 2020-08-18 — End: 2021-03-02

## 2020-08-18 MED ORDER — ONABOTULINUMTOXINA 100 UNITS IJ SOLR
200.0000 [IU] | Freq: Once | INTRAMUSCULAR | Status: AC
  Administered 2020-08-18: 200 [IU] via INTRAMUSCULAR

## 2020-08-18 MED ORDER — ZONISAMIDE 100 MG PO CAPS
ORAL_CAPSULE | ORAL | 1 refills | Status: DC
Start: 2020-08-18 — End: 2021-05-06

## 2020-08-18 NOTE — Progress Notes (Signed)
Botox- 200 units x 1 vial Lot: [id number] Expiration: 04/2023 NDC: 4451-4604-79   Bacteriostatic 0.9% Sodium Chloride- 23mL total [id number] Expiration: 02/22 NDC: 61848-592-76   B/B

## 2020-08-18 NOTE — Procedures (Signed)
     BOTOX PROCEDURE NOTE FOR MIGRAINE HEADACHE   HISTORY: [name] is a 54 year old patient with a history of intractable migraine headache. She has gotten benefit from Botox injections which is continuing, she comes in for another injection.   Description of procedure:  The patient was placed in a sitting position. The standard protocol was used for Botox as follows, with 5 units of Botox injected at each site:   -Procerus muscle, midline injection  -Corrugator muscle, bilateral injection  -Frontalis muscle, bilateral injection, with 2 sites each side, medial injection was performed in the upper one third of the frontalis muscle, in the region vertical from the medial inferior edge of the superior orbital rim. The lateral injection was again in the upper one third of the forehead vertically above the lateral limbus of the cornea, 1.5 cm lateral to the medial injection site.  -Temporalis muscle injection, 4 sites, bilaterally. The first injection was 3 cm above the tragus of the ear, second injection site was 1.5 cm to 3 cm up from the first injection site in line with the tragus of the ear. The third injection site was 1.5-3 cm forward between the first 2 injection sites. The fourth injection site was 1.5 cm posterior to the second injection site.  -Occipitalis muscle injection, 3 sites, bilaterally. The first injection was done one half way between the occipital protuberance and the tip of the mastoid process behind the ear. The second injection site was done lateral and superior to the first, 1 fingerbreadth from the first injection. The third injection site was 1 fingerbreadth superiorly and medially from the first injection site.  -Cervical paraspinal muscle injection, 2 sites, bilateral, the first injection site was 1 cm from the midline of the cervical spine, 3 cm inferior to the lower border of the occipital protuberance. The second injection site was 1.5 cm superiorly and  laterally to the first injection site.  -Trapezius muscle injection was performed at 3 sites, bilaterally. The first injection site was in the upper trapezius muscle halfway between the inflection point of the neck, and the acromion. The second injection site was one half way between the acromion and the first injection site. The third injection was done between the first injection site and the inflection point of the neck.   A 200 unit bottle of Botox was used, 155 units were injected, the rest of the Botox was wasted. The patient tolerated the procedure well, there were no complications of the above procedure.  Botox NDC 2297-9892-11 Lot number [id number] Expiration date May 2024

## 2020-08-24 ENCOUNTER — Telehealth: Payer: Self-pay | Admitting: Neurology

## 2020-08-24 NOTE — Telephone Encounter (Signed)
I called [facility] to check the status of patient's Botox. They informed me that since [facility] is not listed on the PA when it was submitted, I would need to fill out a new PA request and add the specialty pharmacy name. I filled out the PA form and will give to MD to sign.

## 2020-08-25 DIAGNOSIS — F250 Schizoaffective disorder, bipolar type: Secondary | ICD-10-CM | POA: Diagnosis not present

## 2020-09-02 DIAGNOSIS — G43711 Chronic migraine without aura, intractable, with status migrainosus: Secondary | ICD-10-CM | POA: Diagnosis not present

## 2020-09-02 NOTE — Telephone Encounter (Signed)
Faxed signed [employer] Botox PA form with clinical notes.

## 2020-09-03 NOTE — Telephone Encounter (Signed)
(  2) 100U vials of Botox arrived today from [facility]. Patient's next appointment is 12/21.

## 2020-09-07 ENCOUNTER — Telehealth: Payer: Self-pay | Admitting: Neurology

## 2020-09-07 ENCOUNTER — Other Ambulatory Visit: Payer: Self-pay | Admitting: Neurology

## 2020-09-07 MED ORDER — HYDROCODONE-ACETAMINOPHEN 5-325 MG PO TABS
1.0000 | ORAL_TABLET | Freq: Four times a day (QID) | ORAL | 0 refills | Status: DC | PRN
Start: 2020-09-07 — End: 2020-10-08

## 2020-09-07 NOTE — Telephone Encounter (Signed)
Pt is requesting a refill for HYDROcodone-acetaminophen (NORCO/VICODIN) 5-325 MG tablet.  [id number]   

## 2020-09-07 NOTE — Telephone Encounter (Signed)
I have routed this request to Dr Vickey Huger, who is work in, for review. The pt is due for the medication and [place] registry was verified.

## 2020-09-16 NOTE — Telephone Encounter (Signed)
Received denial letter from [facility]. Denial reason of "this medication may be approved for continued treatment when the member's migraine days have gone down by at leat 7 days per month or the migraine duration has gone down by at least 100 hours per month, also this medication is not approved when it is taken with a calcitonin gene-related peptide (CGRP) receptor antagonist (such as Emgality or Aimovig) to prevent migraines at the same time."  After reviewing notes, patient has been getting Botox here since 2016 and has gotten benefit from it. Patient is not taking nor has ever taken CGRP. I called [employer] and spoke with Jonny Ruiz about the situation. He advised me to fax clinical notes "attn PCR" to [phone] with the reference #[id number]. Clinical notes and medication list have been faxed.

## 2020-09-22 ENCOUNTER — Other Ambulatory Visit: Payer: Self-pay | Admitting: Family Medicine

## 2020-09-28 NOTE — Telephone Encounter (Signed)
Received approval from [facility] for Botox. PA #[id number] (09/21/20- 09/20/21).

## 2020-10-05 ENCOUNTER — Other Ambulatory Visit: Payer: Self-pay | Admitting: Family Medicine

## 2020-10-07 NOTE — Telephone Encounter (Signed)
Pt. Requests refill For HYDROcodone-acetaminophen (NORCO/VICODIN) 5-325 MG tablet   [phone]

## 2020-10-08 ENCOUNTER — Other Ambulatory Visit: Payer: Self-pay | Admitting: Neurology

## 2020-10-08 ENCOUNTER — Telehealth: Payer: Self-pay | Admitting: Neurology

## 2020-10-08 MED ORDER — HYDROCODONE-ACETAMINOPHEN 5-325 MG PO TABS
1.0000 | ORAL_TABLET | Freq: Four times a day (QID) | ORAL | 0 refills | Status: DC | PRN
Start: 2020-10-08 — End: 2020-10-12

## 2020-10-08 NOTE — Telephone Encounter (Signed)
Called patient and informed her the refill has been sent in.  Patient was appreciative.

## 2020-10-08 NOTE — Telephone Encounter (Signed)
Pt is requesting a refill for HYDROcodone-acetaminophen (NORCO/VICODIN) 5-325 MG tablet .  [phone] *this is pt's 2nd request*

## 2020-10-12 ENCOUNTER — Other Ambulatory Visit: Payer: Self-pay | Admitting: Emergency Medicine

## 2020-10-12 ENCOUNTER — Other Ambulatory Visit: Payer: Self-pay | Admitting: Neurology

## 2020-10-12 ENCOUNTER — Telehealth: Payer: Self-pay | Admitting: Neurology

## 2020-10-12 MED ORDER — HYDROCODONE-ACETAMINOPHEN 5-325 MG PO TABS: 1.0000 | ORAL_TABLET | Freq: Four times a day (QID) | ORAL | 0 refills | Status: DC | PRN

## 2020-10-12 NOTE — Telephone Encounter (Signed)
Called patient and informed her that the prescription shows sent, however, Dr. Anne Hahn is here and he is aware that the pharmacy did not receive the prescription (or does not have it).  He is going to resend it in this morning.  Patient was appreciative.

## 2020-10-12 NOTE — Progress Notes (Signed)
The prescription for hydrocodone was refilled, the [facility] registry was checked.

## 2020-10-12 NOTE — Telephone Encounter (Signed)
Pt's husband came in today asking about her medication refill, that was sent in on Thursday. The pharmacy is saying they do not have any record of it being sent. Best call back is [phone].

## 2020-10-27 ENCOUNTER — Other Ambulatory Visit: Payer: Self-pay | Admitting: Family Medicine

## 2020-10-27 DIAGNOSIS — R921 Mammographic calcification found on diagnostic imaging of breast: Secondary | ICD-10-CM

## 2020-11-09 ENCOUNTER — Other Ambulatory Visit: Payer: Self-pay | Admitting: Neurology

## 2020-11-09 MED ORDER — HYDROCODONE-ACETAMINOPHEN 5-325 MG PO TABS: 1.0000 | ORAL_TABLET | Freq: Four times a day (QID) | ORAL | 0 refills | Status: DC | PRN

## 2020-11-09 NOTE — Telephone Encounter (Signed)
Pt is requesting a refill for HYDROcodone-acetaminophen (NORCO/VICODIN) 5-325 MG tablet.  [facility] [phone]

## 2020-11-09 NOTE — Addendum Note (Signed)
Addended by: Arther Abbott on: 11/09/2020 10:56 AM   Modules accepted: Orders

## 2020-11-17 DIAGNOSIS — F250 Schizoaffective disorder, bipolar type: Secondary | ICD-10-CM | POA: Diagnosis not present

## 2020-11-24 ENCOUNTER — Ambulatory Visit: Payer: BC Managed Care – PPO | Admitting: Neurology

## 2020-11-24 ENCOUNTER — Encounter: Payer: Self-pay | Admitting: Neurology

## 2020-11-24 VITALS — BP 148/85 | HR 78 | Ht 61.0 in | Wt 97.5 lb

## 2020-11-24 DIAGNOSIS — G43019 Migraine without aura, intractable, without status migrainosus: Secondary | ICD-10-CM

## 2020-11-24 NOTE — Progress Notes (Signed)
Please refer to Botox procedure note. 

## 2020-11-24 NOTE — Procedures (Signed)
     BOTOX PROCEDURE NOTE FOR MIGRAINE HEADACHE   HISTORY: [name] is a 54 year old patient with a history of intractable migraine headache.  She is still having 3 to 4 days a month with incapacitating headache but overall she believes that her treatment with Botox and other medications for headache have been beneficial in helping her function most days.  She returns for another Botox treatment.   Description of procedure:  The patient was placed in a sitting position. The standard protocol was used for Botox as follows, with 5 units of Botox injected at each site:   -Procerus muscle, midline injection  -Corrugator muscle, bilateral injection  -Frontalis muscle, bilateral injection, with 2 sites each side, medial injection was performed in the upper one third of the frontalis muscle, in the region vertical from the medial inferior edge of the superior orbital rim. The lateral injection was again in the upper one third of the forehead vertically above the lateral limbus of the cornea, 1.5 cm lateral to the medial injection site.  -Temporalis muscle injection, 4 sites, bilaterally. The first injection was 3 cm above the tragus of the ear, second injection site was 1.5 cm to 3 cm up from the first injection site in line with the tragus of the ear. The third injection site was 1.5-3 cm forward between the first 2 injection sites. The fourth injection site was 1.5 cm posterior to the second injection site.  -Occipitalis muscle injection, 3 sites, bilaterally. The first injection was done one half way between the occipital protuberance and the tip of the mastoid process behind the ear. The second injection site was done lateral and superior to the first, 1 fingerbreadth from the first injection. The third injection site was 1 fingerbreadth superiorly and medially from the first injection site.  -Cervical paraspinal muscle injection, 2 sites, bilateral, the first injection site was 1 cm from  the midline of the cervical spine, 3 cm inferior to the lower border of the occipital protuberance. The second injection site was 1.5 cm superiorly and laterally to the first injection site.  -Trapezius muscle injection was performed at 3 sites, bilaterally. The first injection site was in the upper trapezius muscle halfway between the inflection point of the neck, and the acromion. The second injection site was one half way between the acromion and the first injection site. The third injection was done between the first injection site and the inflection point of the neck.   A 200 unit bottle of Botox was used, 155 units were injected, the rest of the Botox was wasted. The patient tolerated the procedure well, there were no complications of the above procedure.  Botox NDC 7893-8101-75 Lot number [id number] Expiration date February 2024

## 2020-11-30 ENCOUNTER — Telehealth: Payer: Self-pay | Admitting: Emergency Medicine

## 2020-11-30 NOTE — Telephone Encounter (Signed)
Received fax for Botox PA approved from [employer] [place], dated 11/27/20.  Reference number: [id number] Specialty Pharmacy:  [facility] NPI:  [id number] Effective Dates:  11/25/2020 - 11/24/2021 Telephone: [phone]  For 100 units IJ SOLR Approved 4.0 visits

## 2020-11-30 NOTE — Telephone Encounter (Signed)
Received VM from [place] with [facility]. She states the diagnosis code on the PA is not the same one that they have on the prescription. I called [phone] and spoke with Morrie Sheldon. Advised that we have several diagnosis codes for patient for her migraines (G43.719, G43.019, G43.711), but it is best to use the diagnosis code on the PA to avoid billing issues. Morrie Sheldon states she will use G43.719 for the prescription to match the PA.

## 2020-12-08 ENCOUNTER — Telehealth: Payer: Self-pay | Admitting: Neurology

## 2020-12-08 MED ORDER — HYDROCODONE-ACETAMINOPHEN 5-325 MG PO TABS
1.0000 | ORAL_TABLET | Freq: Four times a day (QID) | ORAL | 0 refills | Status: DC | PRN
Start: 2020-12-08 — End: 2021-01-06

## 2020-12-08 NOTE — Telephone Encounter (Signed)
The [place] registry was checked, the hydrocodone will be called in.

## 2020-12-08 NOTE — Addendum Note (Signed)
Addended by: York Spaniel on: 12/08/2020 03:54 PM   Modules accepted: Orders

## 2020-12-08 NOTE — Telephone Encounter (Signed)
Pt left a voicemail asking for a refill on her HYDROcodone-acetaminophen (NORCO/VICODIN) 5-[phone]

## 2020-12-14 NOTE — Telephone Encounter (Signed)
I was able to get the patient enrolled in the savings plan. I called the patient and gave her the account information as well as the specialty pharmacy card information. I advised the patient that I would call the pharmacy and provide them with the card information. Patient verbalized understanding and was appreciative.    I called [facility] and spoke with Norwood Levo. I provided her with patient's specialty pharmacy card information. She states she will add this to the patient's account and she sent it to the financial department for processing. They will then reach out to the patient to get consent. I called the patient back to advise her of this.

## 2020-12-14 NOTE — Telephone Encounter (Signed)
Received VM from [facility] regarding scheduling delivery of Botox. I returned their call and spoke with Lauren to schedule. Lauren states the order is ready to go out, but the patient did not agree to the co-pay amount. Patient advised the pharmacy that she would speak with her insurance about this and call back.  I ended my call with the pharmacy and called the patient. She states that they were asking her to pay over $1,000 and she has never had to pay this before. I advised the patient that this could be due to the new year starting and that she has not met her deductible. I asked the patient if she is enrolled in the Botox savings program, she states she is not familiar with it. I advised the patient that it is a very beneficial program and that I would be happy to sign her up for it. I explained to her how the program works, and explained to her that she can pay this co-pay with the pharmacy by using the co-pay card available to her in her account. I advised patient that I would get her set up with the savings program and call her back with the information.

## 2020-12-29 NOTE — Telephone Encounter (Signed)
Received fax from [facility] stating that Botox TBD 1/27.

## 2020-12-30 DIAGNOSIS — G43019 Migraine without aura, intractable, without status migrainosus: Secondary | ICD-10-CM | POA: Diagnosis not present

## 2020-12-31 NOTE — Telephone Encounter (Signed)
(  2) 100U vials of Botox delivered today from [facility] for 3/29 appointment.

## 2021-01-06 ENCOUNTER — Other Ambulatory Visit: Payer: Self-pay | Admitting: Neurology

## 2021-01-06 MED ORDER — HYDROCODONE-ACETAMINOPHEN 5-325 MG PO TABS: 1.0000 | ORAL_TABLET | Freq: Four times a day (QID) | ORAL | 0 refills | Status: DC | PRN

## 2021-01-06 NOTE — Telephone Encounter (Signed)
Pt asking for refill of HYDROcodone-acetaminophen (NORCO/VICODIN) 5-325 MG tablet [phone]

## 2021-01-06 NOTE — Addendum Note (Signed)
Addended by: [place] Pink on: 01/06/2021 10:02 AM   Modules accepted: Orders

## 2021-01-18 ENCOUNTER — Other Ambulatory Visit: Payer: Self-pay

## 2021-01-18 ENCOUNTER — Other Ambulatory Visit: Payer: Self-pay | Admitting: Family Medicine

## 2021-01-18 ENCOUNTER — Ambulatory Visit
Admission: RE | Admit: 2021-01-18 | Discharge: 2021-01-18 | Disposition: A | Discharge status: Home / Self Care | Payer: BC Managed Care – PPO | Source: Ambulatory Visit | Attending: Family Medicine | Admitting: Family Medicine

## 2021-01-18 DIAGNOSIS — N6313 Unspecified lump in the right breast, lower outer quadrant: Secondary | ICD-10-CM | POA: Diagnosis not present

## 2021-01-18 DIAGNOSIS — R921 Mammographic calcification found on diagnostic imaging of breast: Secondary | ICD-10-CM | POA: Diagnosis not present

## 2021-01-18 DIAGNOSIS — N6311 Unspecified lump in the right breast, upper outer quadrant: Secondary | ICD-10-CM | POA: Diagnosis not present

## 2021-01-18 DIAGNOSIS — N6310 Unspecified lump in the right breast, unspecified quadrant: Secondary | ICD-10-CM

## 2021-02-08 ENCOUNTER — Telehealth: Payer: Self-pay | Admitting: Neurology

## 2021-02-08 NOTE — Telephone Encounter (Signed)
Pt request refill HYDROcodone-acetaminophen (NORCO/VICODIN) 5-[facility] [phone]

## 2021-02-08 NOTE — Telephone Encounter (Signed)
I sent to Dr. Marjory Lies to review.

## 2021-02-08 NOTE — Telephone Encounter (Signed)
Patient treated at our practice for chronic migraine. Given 75 opiod tabs last prescription. Please review.

## 2021-02-09 ENCOUNTER — Other Ambulatory Visit: Payer: Self-pay | Admitting: Emergency Medicine

## 2021-02-09 ENCOUNTER — Telehealth: Payer: Self-pay | Admitting: Neurology

## 2021-02-09 ENCOUNTER — Other Ambulatory Visit: Payer: Self-pay | Admitting: Neurology

## 2021-02-09 MED ORDER — HYDROCODONE-ACETAMINOPHEN 5-325 MG PO TABS: 1.0000 | ORAL_TABLET | Freq: Four times a day (QID) | ORAL | 0 refills | Status: DC | PRN

## 2021-02-09 NOTE — Telephone Encounter (Signed)
Can she wait until next week when Dr. Anne Hahn is in? thanks

## 2021-02-09 NOTE — Telephone Encounter (Signed)
[facility]  HYDROcodone-acetaminophen (NORCO/VICODIN) 5-325 MG tablet prescription is on backlog. Would like to have the prescription sent to another pharmacy. Would like a call from the nurse.

## 2021-02-15 ENCOUNTER — Telehealth: Payer: Self-pay | Admitting: Neurology

## 2021-02-15 MED ORDER — HYDROCODONE-ACETAMINOPHEN 5-325 MG PO TABS: 1.0000 | ORAL_TABLET | Freq: Four times a day (QID) | ORAL | 0 refills | Status: DC | PRN

## 2021-02-15 NOTE — Telephone Encounter (Signed)
Patient's husband in the lobby requesting refill of hydrocodone for his wife. Pharmacy needs to be [facility] on [address] [phone]. Best call back [phone]

## 2021-02-15 NOTE — Addendum Note (Signed)
Addended by: York Spaniel on: 02/15/2021 02:36 PM   Modules accepted: Orders

## 2021-02-15 NOTE — Telephone Encounter (Signed)
I will send in a prescription for the hydrocodone.

## 2021-02-16 MED ORDER — HYDROCODONE-ACETAMINOPHEN 5-325 MG PO TABS: 1.0000 | ORAL_TABLET | Freq: Four times a day (QID) | ORAL | 0 refills | Status: DC | PRN

## 2021-02-16 NOTE — Telephone Encounter (Signed)
The hydrocodone will be sent to the other pharmacy.

## 2021-02-16 NOTE — Addendum Note (Signed)
Addended by: York Spaniel on: 02/16/2021 04:25 PM   Modules accepted: Orders

## 2021-02-16 NOTE — Telephone Encounter (Signed)
Pt. states that pres. needs to be sent to pharmacy: [facility] on [address] [phone]. Best call back [phone]

## 2021-03-02 ENCOUNTER — Ambulatory Visit: Payer: BC Managed Care – PPO | Admitting: Neurology

## 2021-03-02 VITALS — BP 128/80 | HR 72

## 2021-03-02 DIAGNOSIS — G43019 Migraine without aura, intractable, without status migrainosus: Secondary | ICD-10-CM | POA: Diagnosis not present

## 2021-03-02 MED ORDER — PREGABALIN 150 MG PO CAPS: 150.0000 mg | ORAL_CAPSULE | Freq: Two times a day (BID) | ORAL | 1 refills | Status: DC

## 2021-03-02 NOTE — Procedures (Signed)
     BOTOX PROCEDURE NOTE FOR MIGRAINE HEADACHE   HISTORY: [name] is a 55 year old patient with a history of intractable migraine headaches.  The patient does get benefit from the Botox injections and she returns for yet another Botox therapy.   Description of procedure:  The patient was placed in a sitting position. The standard protocol was used for Botox as follows, with 5 units of Botox injected at each site:   -Procerus muscle, midline injection  -Corrugator muscle, bilateral injection  -Frontalis muscle, bilateral injection, with 2 sites each side, medial injection was performed in the upper one third of the frontalis muscle, in the region vertical from the medial inferior edge of the superior orbital rim. The lateral injection was again in the upper one third of the forehead vertically above the lateral limbus of the cornea, 1.5 cm lateral to the medial injection site.  -Temporalis muscle injection, 4 sites, bilaterally. The first injection was 3 cm above the tragus of the ear, second injection site was 1.5 cm to 3 cm up from the first injection site in line with the tragus of the ear. The third injection site was 1.5-3 cm forward between the first 2 injection sites. The fourth injection site was 1.5 cm posterior to the second injection site.  -Occipitalis muscle injection, 3 sites, bilaterally. The first injection was done one half way between the occipital protuberance and the tip of the mastoid process behind the ear. The second injection site was done lateral and superior to the first, 1 fingerbreadth from the first injection. The third injection site was 1 fingerbreadth superiorly and medially from the first injection site.  -Cervical paraspinal muscle injection, 2 sites, bilateral, the first injection site was 1 cm from the midline of the cervical spine, 3 cm inferior to the lower border of the occipital protuberance. The second injection site was 1.5 cm superiorly and  laterally to the first injection site.  -Trapezius muscle injection was performed at 3 sites, bilaterally. The first injection site was in the upper trapezius muscle halfway between the inflection point of the neck, and the acromion. The second injection site was one half way between the acromion and the first injection site. The third injection was done between the first injection site and the inflection point of the neck.   A 200 unit bottle of Botox was used, 155 units were injected, the rest of the Botox was wasted. The patient tolerated the procedure well, there were no complications of the above procedure.  Botox NDC 1448-1856-31 Lot number [id number] Expiration date February 2024

## 2021-03-02 NOTE — Progress Notes (Signed)
Botox 100 units x 2 vials, total 200 units. NDC 3668-1594-70 Lot [id number] Exp date: 01/2023

## 2021-03-16 ENCOUNTER — Telehealth: Payer: Self-pay | Admitting: Family Medicine

## 2021-03-16 MED ORDER — ESTROGENS CONJUGATED 0.625 MG PO TABS: ORAL_TABLET | ORAL | 0 refills | Status: DC

## 2021-03-16 NOTE — Telephone Encounter (Signed)
Patient is calling and requesting a refill for zolpidem (AMBIEN) 10 MG tablet and  PREMARIN 0.625 MG tablet to be sent to [facility] [address], please advise. CB is [phone]

## 2021-03-17 ENCOUNTER — Telehealth: Payer: Self-pay | Admitting: Neurology

## 2021-03-17 MED ORDER — HYDROCODONE-ACETAMINOPHEN 5-325 MG PO TABS: 1.0000 | ORAL_TABLET | Freq: Four times a day (QID) | ORAL | 0 refills | Status: DC | PRN

## 2021-03-17 MED ORDER — ZOLPIDEM TARTRATE 10 MG PO TABS: 10.0000 mg | ORAL_TABLET | Freq: Every evening | ORAL | 0 refills | Status: DC | PRN

## 2021-03-17 NOTE — Telephone Encounter (Signed)
I sent in for #30 of Zolpidem. After that she will need an OV for refills

## 2021-03-17 NOTE — Telephone Encounter (Signed)
Appointment has been scheduled for 03/24/21

## 2021-03-17 NOTE — Telephone Encounter (Signed)
Pt request refill HYDROcodone-acetaminophen (NORCO/VICODIN) 5-325 MG tablet at [facility] #[id number]  

## 2021-03-18 NOTE — Telephone Encounter (Signed)
Pt has called to report that the HYDROcodone-acetaminophen (NORCO/VICODIN) 5-325 MG should have been called into  [facility] #[id number] and not [facility](which is where it shows to have been called into) please call pt to confirm it has been corrected

## 2021-03-22 MED ORDER — HYDROCODONE-ACETAMINOPHEN 5-325 MG PO TABS: 1.0000 | ORAL_TABLET | Freq: Four times a day (QID) | ORAL | 0 refills | Status: DC | PRN

## 2021-03-22 NOTE — Addendum Note (Signed)
Addended by: Joycelyn Schmid R on: 03/22/2021 01:25 PM   Modules accepted: Orders

## 2021-03-22 NOTE — Telephone Encounter (Signed)
Pt called, HYDROcodone-acetaminophen (NORCO/VICODIN) 5-325 MG tablet was sent to the wrong pharmacy. Have been without my medication all weekend. Please send to [facility] #[id number]

## 2021-03-22 NOTE — Telephone Encounter (Signed)
Meds ordered this encounter  Medications  . DISCONTD: HYDROcodone-acetaminophen (NORCO/VICODIN) 5-325 MG tablet    Sig: Take 1 tablet by mouth every 6 (six) hours as needed for moderate pain. Prescription must last 30 or more days    Dispense:  75 tablet    Refill:  0    Refill requests to Dr. Anne Hahn  . HYDROcodone-acetaminophen (NORCO/VICODIN) 5-325 MG tablet    Sig: Take 1 tablet by mouth every 6 (six) hours as needed for moderate pain. Prescription must last 30 or more days    Dispense:  75 tablet    Refill:  0    Refill requests to Dr. Lennie Muckle, MD 03/22/2021, 1:24 PM Certified in Neurology, Neurophysiology and Neuroimaging  [facility] [address] [place], [place] [address] [phone]

## 2021-03-24 ENCOUNTER — Encounter: Payer: Self-pay | Admitting: Family Medicine

## 2021-03-24 ENCOUNTER — Other Ambulatory Visit: Payer: Self-pay

## 2021-03-24 ENCOUNTER — Ambulatory Visit: Payer: BC Managed Care – PPO | Admitting: Family Medicine

## 2021-03-24 VITALS — BP 118/78 | HR 78 | Temp 97.6°F | Wt 92.6 lb

## 2021-03-24 DIAGNOSIS — F324 Major depressive disorder, single episode, in partial remission: Secondary | ICD-10-CM | POA: Diagnosis not present

## 2021-03-24 DIAGNOSIS — F5104 Psychophysiologic insomnia: Secondary | ICD-10-CM

## 2021-03-24 MED ORDER — ZOLPIDEM TARTRATE 10 MG PO TABS: 10.0000 mg | ORAL_TABLET | Freq: Every evening | ORAL | 5 refills | Status: DC | PRN

## 2021-03-24 MED ORDER — TRAZODONE HCL 300 MG PO TABS: 300.0000 mg | ORAL_TABLET | Freq: Every day | ORAL | 3 refills | Status: DC

## 2021-03-24 NOTE — Progress Notes (Signed)
   Subjective:    Patient ID: [name], female    DOB: 03-24-1966, 55 y.o.   MRN: [id number]  HPI Here to follow up. She feels well. She sees Dr. Anne Hahn (Neurology) and Deatra Robinson (Psychiatry) every 6 months. She sleeps well, her moods are stable, and she has been seizure free for several years.    Review of Systems  Constitutional: Negative.   Respiratory: Negative.   Cardiovascular: Negative.   Neurological: Negative.   Psychiatric/Behavioral: Negative.        Objective:   Physical Exam Constitutional:      Comments: she is quite thin   Cardiovascular:     Rate and Rhythm: Normal rate and regular rhythm.     Pulses: Normal pulses.     Heart sounds: Normal heart sounds.  Pulmonary:     Effort: Pulmonary effort is normal.     Breath sounds: Normal breath sounds.  Neurological:     General: No focal deficit present.     Mental Status: She is alert and oriented to person, place, and time.  Psychiatric:        Mood and Affect: Mood normal.        Behavior: Behavior normal.        Thought Content: Thought content normal.        Judgment: Judgment normal.           Assessment & Plan:  Her depression and insomnia seem to be stable. We will refill Zolpidem and Trazodone.  Recheck as needed.  Gershon Crane, MD

## 2021-04-07 ENCOUNTER — Telehealth: Payer: Self-pay | Admitting: Neurology

## 2021-04-07 NOTE — Telephone Encounter (Signed)
[name] from [facility] called to schedule Botox delivery. Botox TBD 5/10. Patient's next appointment is 7/7.

## 2021-04-12 DIAGNOSIS — G43019 Migraine without aura, intractable, without status migrainosus: Secondary | ICD-10-CM | POA: Diagnosis not present

## 2021-04-13 NOTE — Telephone Encounter (Signed)
Received (2) 100 unit vials of Botox today from [facility]. 

## 2021-04-20 ENCOUNTER — Other Ambulatory Visit: Payer: Self-pay | Admitting: Neurology

## 2021-04-20 IMAGING — MG DIGITAL DIAGNOSTIC UNILATERAL LEFT MAMMOGRAM WITH TOMO AND CAD
6 series · 6 of 14 positions shown · non-contrast
Comparison: Previous exam(s).

CLINICAL DATA: 52-year-old female presenting for first six-month
follow-up of probably benign left breast calcifications.

EXAM:
DIGITAL DIAGNOSTIC UNILATERAL LEFT MAMMOGRAM WITH CAD AND TOMO

[L CC]
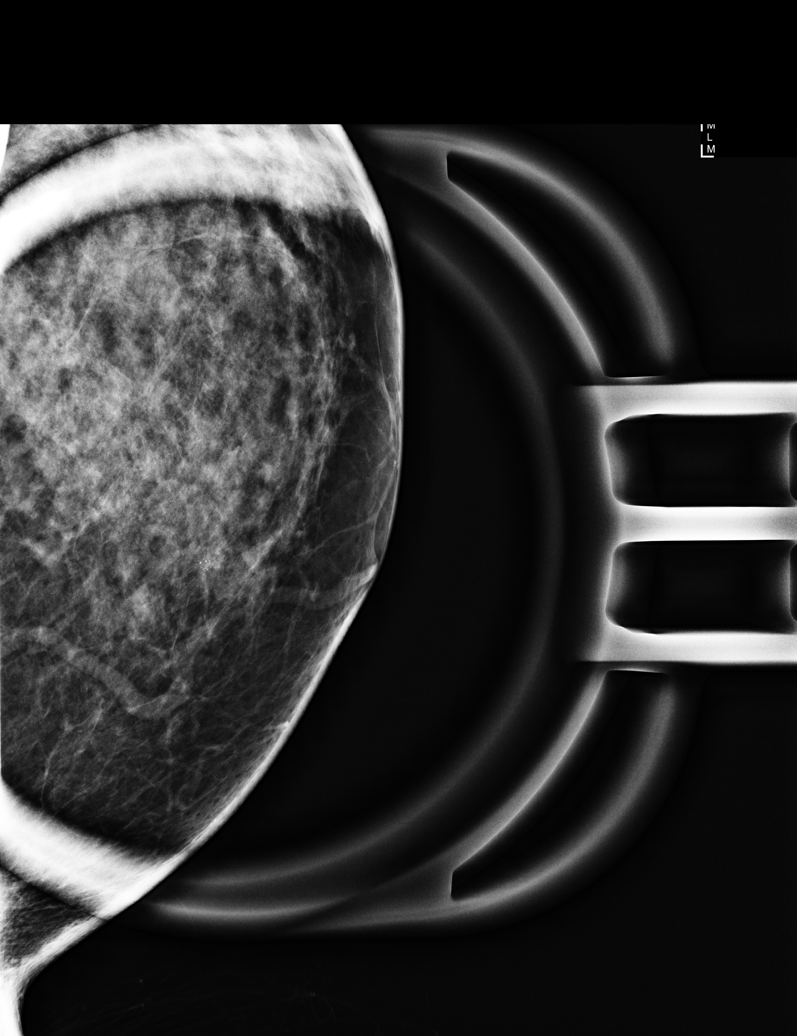

[L ML]
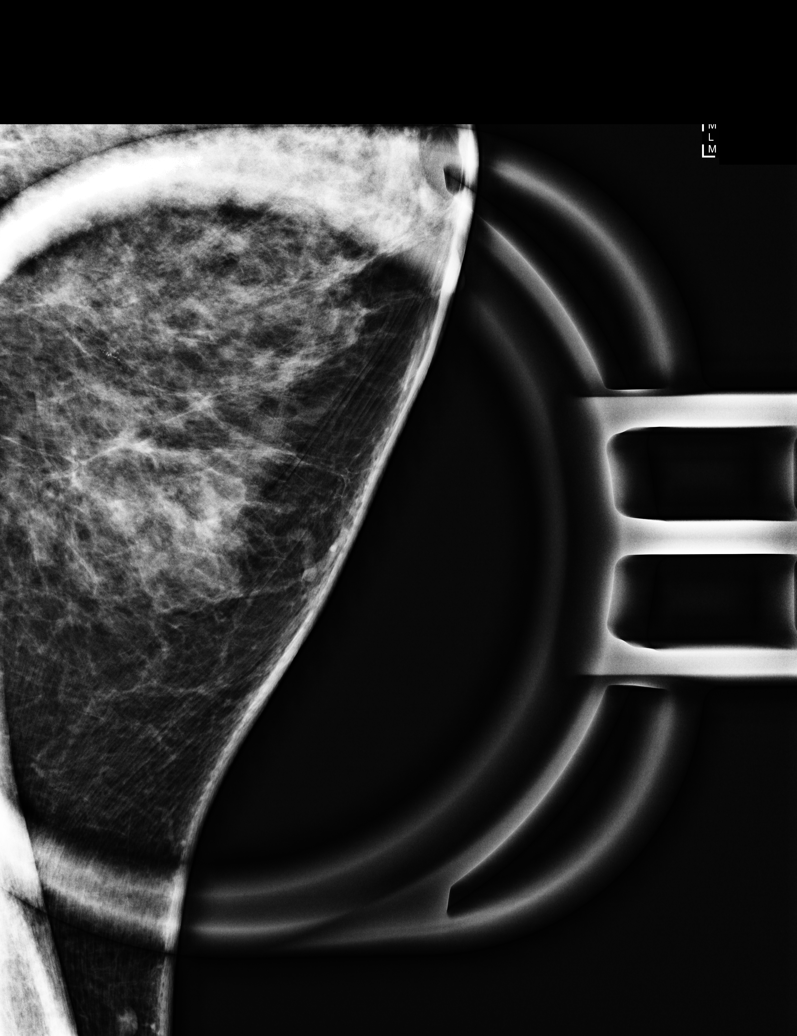

[L MLO synth-2D]
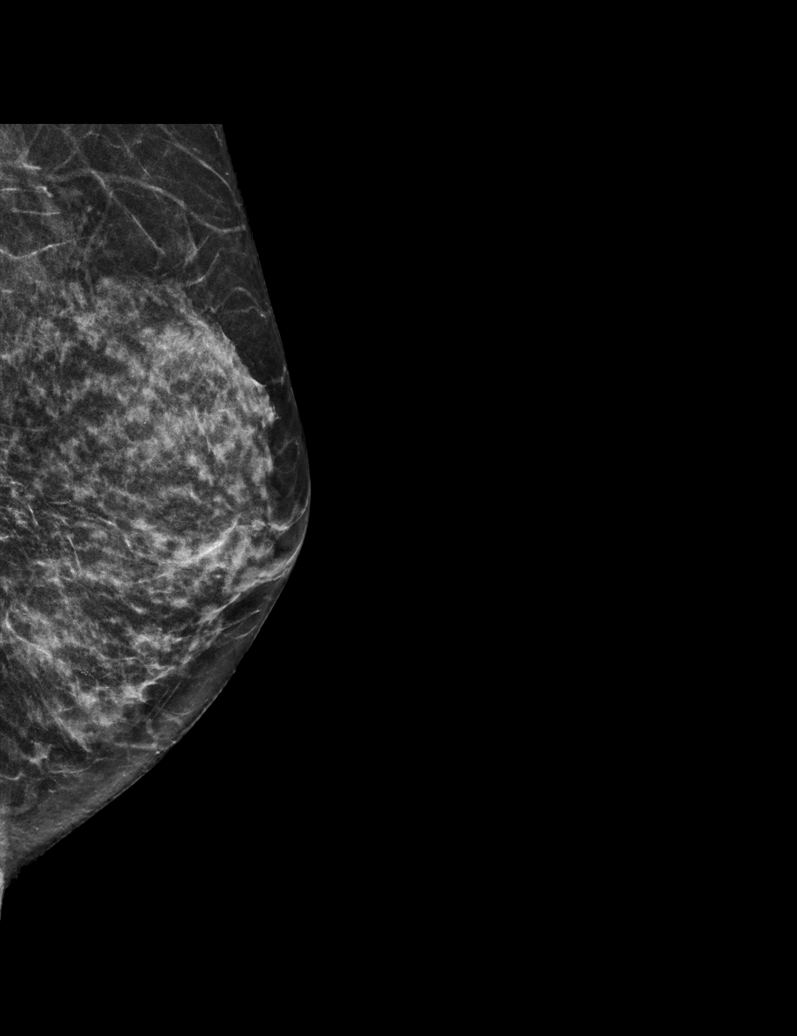

[L CC synth-2D]
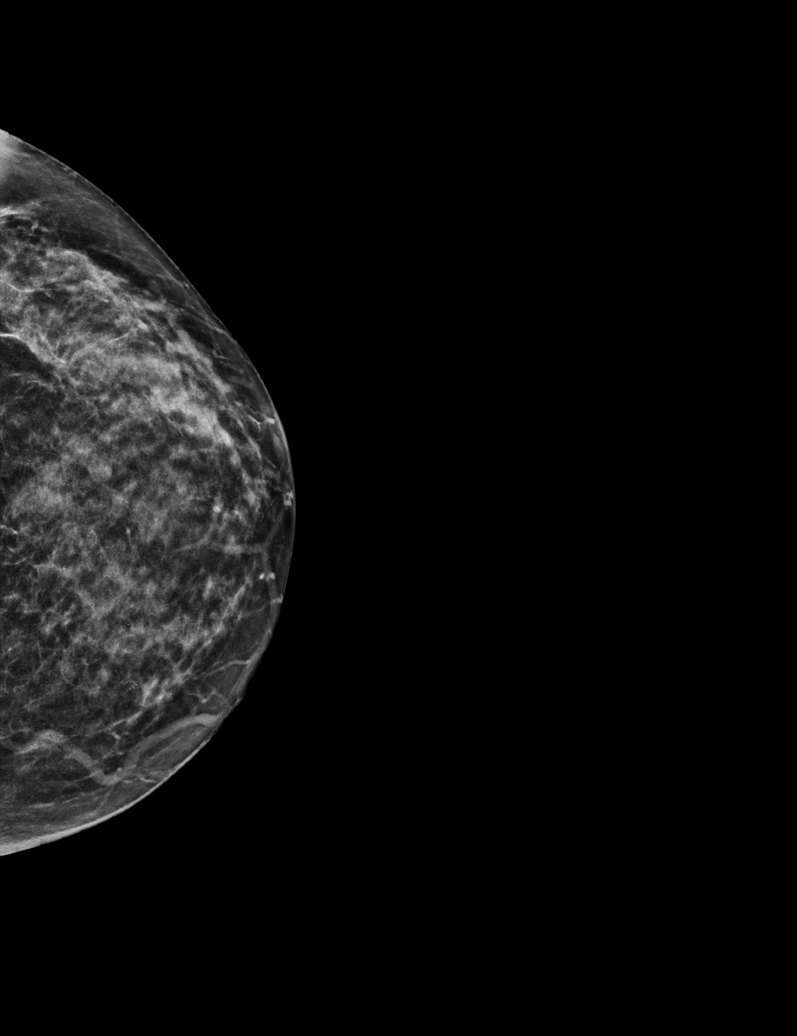

[L CC tomo · tomo slice 23/45.0]
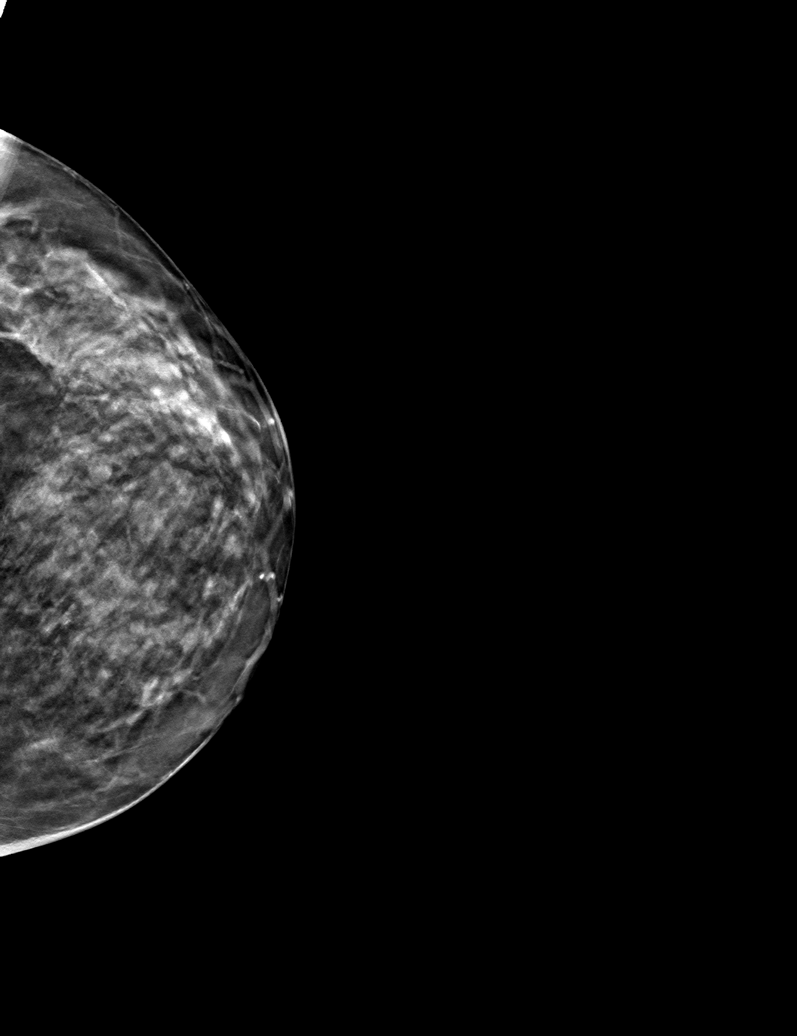

[L MLO tomo · tomo slice 23/44.0]
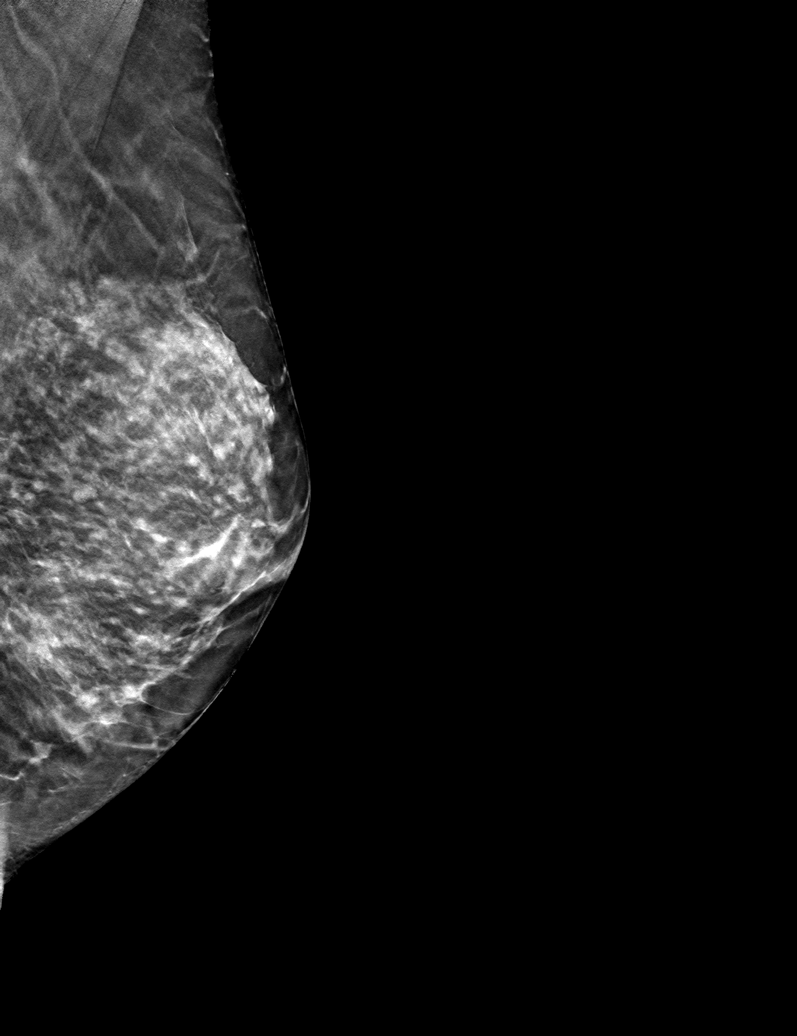

[6 of 14 positions shown; findings below may reference images not displayed]

ACR Breast Density Category c: The breast tissue is heterogeneously
dense, which may obscure small masses.
FINDINGS: 3-4 mm group of punctate calcifications in the lower inner left
breast is mammographically stable. No additional or new mammographic
findings are identified.

Mammographic images were processed with CAD.
IMPRESSION: Stable, probably benign left breast calcifications. Recommend
continued imaging follow-up.

RECOMMENDATION:
Bilateral diagnostic mammogram in 6 months.

I have discussed the findings and recommendations with the patient.
Results were also provided in writing at the conclusion of the
visit. If applicable, a reminder letter will be sent to the patient
regarding the next appointment.

BI-RADS CATEGORY  3: Probably benign.

## 2021-04-20 MED ORDER — HYDROCODONE-ACETAMINOPHEN 5-325 MG PO TABS: 1.0000 | ORAL_TABLET | Freq: Four times a day (QID) | ORAL | 0 refills | Status: DC | PRN

## 2021-04-20 NOTE — Telephone Encounter (Signed)
Pt request refill HYDROcodone-acetaminophen (NORCO/VICODIN) 5-325 MG tablet at [facility] #[id number]  

## 2021-05-06 ENCOUNTER — Other Ambulatory Visit: Payer: Self-pay | Admitting: Emergency Medicine

## 2021-05-06 ENCOUNTER — Telehealth: Payer: Self-pay | Admitting: Neurology

## 2021-05-06 MED ORDER — ZONISAMIDE 100 MG PO CAPS: ORAL_CAPSULE | ORAL | 0 refills | Status: DC

## 2021-05-06 NOTE — Telephone Encounter (Signed)
Pt is requesting a refill for zonisamide (ZONEGRAN) 100 MG capsule .  Pharmacy:  [facility] [phone]

## 2021-05-20 ENCOUNTER — Other Ambulatory Visit: Payer: Self-pay | Admitting: Neurology

## 2021-05-20 MED ORDER — HYDROCODONE-ACETAMINOPHEN 5-325 MG PO TABS: 1.0000 | ORAL_TABLET | Freq: Four times a day (QID) | ORAL | 0 refills | Status: DC | PRN

## 2021-05-20 NOTE — Telephone Encounter (Signed)
Pt request refill HYDROcodone-acetaminophen (NORCO/VICODIN) 5-325 MG tablet at [facility] #[id number]  

## 2021-05-25 DIAGNOSIS — F250 Schizoaffective disorder, bipolar type: Secondary | ICD-10-CM | POA: Diagnosis not present

## 2021-06-10 ENCOUNTER — Other Ambulatory Visit: Payer: Self-pay

## 2021-06-10 ENCOUNTER — Encounter: Payer: Self-pay | Admitting: Neurology

## 2021-06-10 ENCOUNTER — Ambulatory Visit: Payer: BC Managed Care – PPO | Admitting: Neurology

## 2021-06-10 DIAGNOSIS — G43711 Chronic migraine without aura, intractable, with status migrainosus: Secondary | ICD-10-CM

## 2021-06-10 NOTE — Progress Notes (Signed)
Botox- 100 units x 2 vial Lot: [id number] Expiration: 5/24 NDC: 5110-2111-73  Bacteriostatic 0.9% Sodium Chloride- 60mL total [id number] Expiration: 2/23 NDC: 13143-888-75  Dx: G43.019 SP

## 2021-06-10 NOTE — Procedures (Signed)
     BOTOX PROCEDURE NOTE FOR MIGRAINE HEADACHE   HISTORY: [name] is a 55 year old patient with a history of intractable migraine headache.  She does get benefit with the Botox and returns for another treatment at this time.  She tolerates the injections well.   Description of procedure:  The patient was placed in a sitting position. The standard protocol was used for Botox as follows, with 5 units of Botox injected at each site:   -Procerus muscle, midline injection  -Corrugator muscle, bilateral injection  -Frontalis muscle, bilateral injection, with 2 sites each side, medial injection was performed in the upper one third of the frontalis muscle, in the region vertical from the medial inferior edge of the superior orbital rim. The lateral injection was again in the upper one third of the forehead vertically above the lateral limbus of the cornea, 1.5 cm lateral to the medial injection site.  -Temporalis muscle injection, 4 sites, bilaterally. The first injection was 3 cm above the tragus of the ear, second injection site was 1.5 cm to 3 cm up from the first injection site in line with the tragus of the ear. The third injection site was 1.5-3 cm forward between the first 2 injection sites. The fourth injection site was 1.5 cm posterior to the second injection site.  -Occipitalis muscle injection, 3 sites, bilaterally. The first injection was done one half way between the occipital protuberance and the tip of the mastoid process behind the ear. The second injection site was done lateral and superior to the first, 1 fingerbreadth from the first injection. The third injection site was 1 fingerbreadth superiorly and medially from the first injection site.  -Cervical paraspinal muscle injection, 2 sites, bilateral, the first injection site was 1 cm from the midline of the cervical spine, 3 cm inferior to the lower border of the occipital protuberance. The second injection site was 1.5 cm  superiorly and laterally to the first injection site.  -Trapezius muscle injection was performed at 3 sites, bilaterally. The first injection site was in the upper trapezius muscle halfway between the inflection point of the neck, and the acromion. The second injection site was one half way between the acromion and the first injection site. The third injection was done between the first injection site and the inflection point of the neck.   A 200 unit bottle of Botox was used, 155 units were injected, the rest of the Botox was wasted. The patient tolerated the procedure well, there were no complications of the above procedure.  Botox NDC 4782-9562-13 Lot number [id number] Expiration date May 2024

## 2021-06-21 ENCOUNTER — Telehealth: Payer: Self-pay | Admitting: Neurology

## 2021-06-21 MED ORDER — HYDROCODONE-ACETAMINOPHEN 5-325 MG PO TABS: 1.0000 | ORAL_TABLET | Freq: Four times a day (QID) | ORAL | 0 refills | Status: DC | PRN

## 2021-06-21 NOTE — Telephone Encounter (Signed)
Pt requesting refill for HYDROcodone-acetaminophen (NORCO/VICODIN) 5-325 MG tablet. Pharmacy [facility] [phone].

## 2021-06-21 NOTE — Telephone Encounter (Signed)
The [place] registry was checked, the hydrocodone will be refilled. 

## 2021-06-21 NOTE — Addendum Note (Signed)
Addended by: York Spaniel on: 06/21/2021 11:08 AM   Modules accepted: Orders

## 2021-07-12 ENCOUNTER — Other Ambulatory Visit: Payer: Self-pay | Admitting: Family Medicine

## 2021-07-20 ENCOUNTER — Other Ambulatory Visit: Payer: Self-pay | Admitting: Neurology

## 2021-07-20 NOTE — Addendum Note (Signed)
Addended by: [place] Pink on: 07/20/2021 09:21 AM   Modules accepted: Orders

## 2021-07-20 NOTE — Telephone Encounter (Signed)
Pt called requesting refill for HYDROcodone-acetaminophen (NORCO/VICODIN) 5-325 MG tablet. Pharmacy [facility] [phone].

## 2021-07-21 MED ORDER — HYDROCODONE-ACETAMINOPHEN 5-325 MG PO TABS: 1.0000 | ORAL_TABLET | Freq: Four times a day (QID) | ORAL | 0 refills | Status: DC | PRN

## 2021-07-26 DIAGNOSIS — G43019 Migraine without aura, intractable, without status migrainosus: Secondary | ICD-10-CM | POA: Diagnosis not present

## 2021-07-27 ENCOUNTER — Telehealth: Payer: Self-pay | Admitting: Neurology

## 2021-07-27 NOTE — Telephone Encounter (Signed)
Patient's next Botox appointment is 10/12. Today I received (2) 100 unit vials of Botox from [facility]. Phone: [phone].

## 2021-08-02 ENCOUNTER — Other Ambulatory Visit: Payer: Self-pay | Admitting: *Deleted

## 2021-08-02 MED ORDER — ZONISAMIDE 100 MG PO CAPS: ORAL_CAPSULE | ORAL | 0 refills | Status: DC

## 2021-08-19 ENCOUNTER — Other Ambulatory Visit: Payer: Self-pay | Admitting: Neurology

## 2021-08-19 MED ORDER — HYDROCODONE-ACETAMINOPHEN 5-325 MG PO TABS: 1.0000 | ORAL_TABLET | Freq: Four times a day (QID) | ORAL | 0 refills | Status: DC | PRN

## 2021-08-19 NOTE — Telephone Encounter (Signed)
Dr. Terrace Arabia has refilled rx has requested.

## 2021-08-19 NOTE — Telephone Encounter (Signed)
Pt request refill HYDROcodone-acetaminophen (NORCO/VICODIN) 5-325 MG tablet at [facility] #[id number]  

## 2021-08-19 NOTE — Telephone Encounter (Signed)
[place] drug registry verified. Last refill for Hydrocodone was 07/21/2021 # 75 for a 30 day supply.

## 2021-09-13 ENCOUNTER — Other Ambulatory Visit: Payer: Self-pay | Admitting: Neurology

## 2021-09-15 ENCOUNTER — Encounter: Payer: Self-pay | Admitting: Neurology

## 2021-09-15 ENCOUNTER — Ambulatory Visit: Payer: BC Managed Care – PPO | Admitting: Neurology

## 2021-09-15 ENCOUNTER — Other Ambulatory Visit: Payer: Self-pay | Admitting: Neurology

## 2021-09-15 VITALS — BP 136/83 | HR 64

## 2021-09-15 DIAGNOSIS — G43019 Migraine without aura, intractable, without status migrainosus: Secondary | ICD-10-CM

## 2021-09-15 MED ORDER — HYDROCODONE-ACETAMINOPHEN 5-325 MG PO TABS: 1.0000 | ORAL_TABLET | Freq: Four times a day (QID) | ORAL | 0 refills | Status: DC | PRN

## 2021-09-15 NOTE — Procedures (Signed)
     BOTOX PROCEDURE NOTE FOR MIGRAINE HEADACHE   HISTORY: [name] is a 55 year old patient with a history of intractable migraine headache.  She does get some benefit from the Botox therapy and she wishes to continue the treatments.  She comes in for another therapy.   Description of procedure:  The patient was placed in a sitting position. The standard protocol was used for Botox as follows, with 5 units of Botox injected at each site:   -Procerus muscle, midline injection  -Corrugator muscle, bilateral injection  -Frontalis muscle, bilateral injection, with 2 sites each side, medial injection was performed in the upper one third of the frontalis muscle, in the region vertical from the medial inferior edge of the superior orbital rim. The lateral injection was again in the upper one third of the forehead vertically above the lateral limbus of the cornea, 1.5 cm lateral to the medial injection site.  -Temporalis muscle injection, 4 sites, bilaterally. The first injection was 3 cm above the tragus of the ear, second injection site was 1.5 cm to 3 cm up from the first injection site in line with the tragus of the ear. The third injection site was 1.5-3 cm forward between the first 2 injection sites. The fourth injection site was 1.5 cm posterior to the second injection site.  -Occipitalis muscle injection, 3 sites, bilaterally. The first injection was done one half way between the occipital protuberance and the tip of the mastoid process behind the ear. The second injection site was done lateral and superior to the first, 1 fingerbreadth from the first injection. The third injection site was 1 fingerbreadth superiorly and medially from the first injection site.  -Cervical paraspinal muscle injection, 2 sites, bilateral, the first injection site was 1 cm from the midline of the cervical spine, 3 cm inferior to the lower border of the occipital protuberance. The second injection site was  1.5 cm superiorly and laterally to the first injection site.  -Trapezius muscle injection was performed at 3 sites, bilaterally. The first injection site was in the upper trapezius muscle halfway between the inflection point of the neck, and the acromion. The second injection site was one half way between the acromion and the first injection site. The third injection was done between the first injection site and the inflection point of the neck.   A 200 unit bottle of Botox was used, 155 units were injected, the rest of the Botox was wasted. The patient tolerated the procedure well, there were no complications of the above procedure.  Botox NDC 0865-7846-96 Lot number [id number] Expiration date August 2024

## 2021-09-15 NOTE — Progress Notes (Signed)
Botox- 200 units x 2 vial Lot: [id number] Expiration: 8/24 NDC: 9470-9628-36  Bacteriostatic 0.9% Sodium Chloride- 76mL total [id number] Expiration: 12/23 NDC: 03546-568-12  Dx: G43.711 SP

## 2021-09-17 ENCOUNTER — Telehealth: Payer: Self-pay | Admitting: Neurology

## 2021-09-17 NOTE — Telephone Encounter (Signed)
Pt request refill HYDROcodone-acetaminophen (NORCO/VICODIN) 5-325 MG tablet at [facility] #[id number]  

## 2021-09-20 MED ORDER — HYDROCODONE-ACETAMINOPHEN 5-325 MG PO TABS: 1.0000 | ORAL_TABLET | Freq: Four times a day (QID) | ORAL | 0 refills | Status: DC | PRN

## 2021-09-20 NOTE — Telephone Encounter (Signed)
[place] drug registry has been verified. Last refill was 08/20/2021 # 75 for a 30 day supply.

## 2021-09-20 NOTE — Telephone Encounter (Signed)
A prescription for hydrocodone will be sent in. 

## 2021-09-29 ENCOUNTER — Telehealth: Payer: Self-pay | Admitting: Neurology

## 2021-09-29 NOTE — Telephone Encounter (Signed)
Patient's next Botox injection is 12/08/21 with Dr. Delena Bali (per Dr. Anne Hahn, retiring at the end of this month).  Patient's PA with [employer] for Botox expired 10/17. I filled out [employer] PA form and faxed to plan to initate reauthorization under Dr. Delena Bali. Upon approval I will send in a new prescription under Dr. Delena Bali to [facility] for [facility]otox. Per pharmacy patient has no refills left.

## 2021-10-04 ENCOUNTER — Telehealth: Payer: Self-pay | Admitting: Neurology

## 2021-10-04 NOTE — Telephone Encounter (Signed)
I'm fine prescribing enough for her to complete the taper, but won't fill any more beyond that. Thanks

## 2021-10-04 NOTE — Telephone Encounter (Signed)
I called patient today to remind her that Dr. Anne Hahn has retired and that we will no longer be filling his opioid prescriptions.  Dr. Anne Hahn was supposed to have spoken with everyone, patient was very pleasant but she seemed surprised, however very willing to be titrated off of her opioids which is Norco.  Patient gets 44 monthly norco for migraine management her last fill was October 14.  Per Dr. Epimenio Foot, a  reasonable plan would be to reduce by 25% weekly. Once a person is down to about 30 mg morphine equivalents (i.e. 30 mg/d Morphine or hydrocodone; 15 mg a day oxycodone or 12.5 mcg fentanyl patch, the dose can be dropped 25% or so every 3 days)  This patient will be seeing Dr. Armenia in January for Botox.  In the meantime, Dr. Delena Bali are you comfortable with titrating this patient off of her opioids with the above instructions? You may need to see her sooner as an office appointment to discuss alternative management.   Let us know, thanks

## 2021-10-05 ENCOUNTER — Other Ambulatory Visit: Payer: Self-pay | Admitting: Neurology

## 2021-10-05 DIAGNOSIS — G43711 Chronic migraine without aura, intractable, with status migrainosus: Secondary | ICD-10-CM

## 2021-10-05 NOTE — Telephone Encounter (Signed)
Received approval from [facility]. PA reference #[id number] (09/30/21- 08/31/22).  I called [facility] and provided verbal Botox prescription x 4 refills.

## 2021-10-14 ENCOUNTER — Other Ambulatory Visit: Payer: Self-pay | Admitting: Family Medicine

## 2021-10-14 ENCOUNTER — Other Ambulatory Visit: Payer: Self-pay | Admitting: Neurology

## 2021-10-14 MED ORDER — HYDROCODONE-ACETAMINOPHEN 5-325 MG PO TABS: 1.0000 | ORAL_TABLET | Freq: Four times a day (QID) | ORAL | 0 refills | Status: DC | PRN

## 2021-10-14 NOTE — Telephone Encounter (Signed)
I called and advised we have refilled the medication and would get the tapering instructions from Dr. Delena Bali on Monday.

## 2021-10-14 NOTE — Addendum Note (Signed)
Addended by: Ann Maki on: 10/14/2021 09:03 AM   Modules accepted: Orders

## 2021-10-14 NOTE — Telephone Encounter (Signed)
I called the pt in regards to this request.  I advised the pt Dr. Anne Hahn has officially retired as of 10/01/2021 and Dr. Delena Bali who will be assuming her care would like to start the tapering off process for the hydrocodone.  Pt is agreeable to this. I will fwd tapering off protocol to Dr. Terrace Arabia for review since she is work in and Dr. Delena Bali is on vacation.

## 2021-10-14 NOTE — Telephone Encounter (Signed)
Pt is requesting a refill for HYDROcodone-acetaminophen (NORCO/VICODIN) 5-325 MG tablet.  Pharmacy: [facility] #[id number]   

## 2021-10-18 DIAGNOSIS — G43019 Migraine without aura, intractable, without status migrainosus: Secondary | ICD-10-CM | POA: Diagnosis not present

## 2021-10-18 NOTE — Telephone Encounter (Addendum)
Called patient, had her get paper, pen. In reviewing taper schedule I had questions. I advised patient I"ll get clarification and then call her later. Patient verbalized understanding, appreciation. Upon review of current Rx I called patient back. I gave her detailed taper instructions per Dr Delena Bali. She wrote down all instructions, asked what to do if she gets band headache during this time. I advised she call us to give details and we'll have Dr Delena Bali advise her. Patient verbalized understanding, appreciation.

## 2021-10-18 NOTE — Telephone Encounter (Signed)
For the taper: 1 tablet every 8 hours for one week, then  1 tablet twice a day for one week, then  0.5 tablet in the morning and 1 tablet at night for one week, then 1 tablet daily for one week, then 0.5 tablet daily for one week, then 0.5 tablet every other day for one week,  Then stop

## 2021-10-19 NOTE — Telephone Encounter (Signed)
Received (2) 100 unit vials of Botox today from [facility].

## 2021-10-20 ENCOUNTER — Other Ambulatory Visit: Payer: Self-pay

## 2021-10-20 ENCOUNTER — Telehealth (INDEPENDENT_AMBULATORY_CARE_PROVIDER_SITE_OTHER): Payer: BC Managed Care – PPO | Admitting: Family Medicine

## 2021-10-20 ENCOUNTER — Encounter: Payer: Self-pay | Admitting: Family Medicine

## 2021-10-20 DIAGNOSIS — F5104 Psychophysiologic insomnia: Secondary | ICD-10-CM

## 2021-10-20 MED ORDER — ZOLPIDEM TARTRATE 10 MG PO TABS: 10.0000 mg | ORAL_TABLET | Freq: Every evening | ORAL | 5 refills | Status: DC | PRN

## 2021-10-20 MED ORDER — ESTROGENS CONJUGATED 0.625 MG PO TABS: ORAL_TABLET | ORAL | 3 refills | Status: DC

## 2021-10-20 NOTE — Telephone Encounter (Signed)
Patient has video visit scheduled for today 10/20/21

## 2021-10-20 NOTE — Progress Notes (Signed)
   Subjective:    Patient ID: [name], female    DOB: 1966/10/04, 55 y.o.   MRN: [id number]  HPI Virtual Visit via Telephone Note  I connected with the patient on 10/20/21 at  3:45 PM EST by telephone and verified that I am speaking with the correct person using two identifiers.   I discussed the limitations, risks, security and privacy concerns of performing an evaluation and management service by telephone and the availability of in person appointments. I also discussed with the patient that there may be a patient responsible charge related to this service. The patient expressed understanding and agreed to proceed.  Location patient: home Location provider: work or home office Participants present for the call: patient, provider Patient did not have a visit in the prior 7 days to address this/these issue(s).   History of Present Illness: Here to follow up on insomnia. She still sleeps well with Zolpidem but she cannot sleep without it. She now sees Dr. Naomie Dean for her seizures, since Dr. Anne Hahn has retired.    Observations/Objective: Patient sounds cheerful and well on the phone. I do not appreciate any SOB. Speech and thought processing are grossly intact. Patient reported vitals:  Assessment and Plan: Insomnia, stable. Refilled Zolpidem for 6 months. Gershon Crane, MD   Follow Up Instructions:     [phone] 5-10 [phone] 11-20 [phone] 21-30 I did not refer this patient for an OV in the next 24 hours for this/these issue(s).  I discussed the assessment and treatment plan with the patient. The patient was provided an opportunity to ask questions and all were answered. The patient agreed with the plan and demonstrated an understanding of the instructions.   The patient was advised to call back or seek an in-person evaluation if the symptoms worsen or if the condition fails to improve as anticipated.  I provided 12 minutes of non-face-to-face time during this  encounter.   Gershon Crane, MD     Review of Systems     Objective:   Physical Exam        Assessment & Plan:

## 2021-11-03 ENCOUNTER — Other Ambulatory Visit: Payer: Self-pay | Admitting: Neurology

## 2021-11-09 ENCOUNTER — Telehealth: Payer: Self-pay | Admitting: Psychiatry

## 2021-11-09 NOTE — Telephone Encounter (Signed)
Suggestions?

## 2021-11-09 NOTE — Telephone Encounter (Signed)
Pt states she was told to call if while coming off of HYDROcodone she experienced a headache. Pt is having a headache, please call.

## 2021-11-10 ENCOUNTER — Other Ambulatory Visit: Payer: Self-pay | Admitting: Psychiatry

## 2021-11-10 MED ORDER — METHOCARBAMOL 500 MG PO TABS: 500.0000 mg | ORAL_TABLET | Freq: Every evening | ORAL | 2 refills | Status: DC | PRN

## 2021-11-10 NOTE — Telephone Encounter (Signed)
I sent in an Rx for Robaxin for her to take as needed for headaches. It may make her sleepy so she should take the first dose at bedtime.

## 2021-11-10 NOTE — Telephone Encounter (Signed)
Contacted pt, informed her DrChima sent in an Rx for Robaxin for her to take as needed for headaches. It may make her sleepy so she should take the first dose at bedtime. She was appreciative.

## 2021-11-16 DIAGNOSIS — F250 Schizoaffective disorder, bipolar type: Secondary | ICD-10-CM | POA: Diagnosis not present

## 2021-12-07 NOTE — Progress Notes (Signed)
[name]  BOTULINUM TOXIN INJECTION PROCEDURE NOTE  Patient: [name] MRN: [id number]  Indication: Chronic migraines   History: 56 year old female who follows in clinic for chronic migraine and epilepsy. Currently she has 3 headaches per week. Botox reduces severity of her headaches. She has weaned off of hydrocodone and would like a new rescue. She has previously tried Maxalt, zonisamide, lamictal, lyrica, metoprolol, lisinopril, cymbalta, effexor, and aimovig.  Last injection: 09/15/21  Technique: Informed consent was obtained and signed. 200 unites of onabotulinumtoxinA (Lot# [id number]; Expiration: 10/2023) were reconstituted using normal saline, to a concentration of 5 units per 0.20ml. 155 units were injected using sterile technique across 31 sites as follows: corrugator 10, procerus 5 units, frontalis 20 units, temporalis 40 units, occipitalis 30 units, cervical paraspinal 20 units, trapezius 30 units. 45 units were wasted. Patient tolerated procedure without complication.  Plan: -Continue Botox, zonisamide, and Lyrica -Start Relpax 40 mg PRN for migraines and Zofran 8 mg PRN for nausea -next steps: consider nurtec/ubrelvy, qulipta, vyepti  Victorino Dike Olie Dibert 12/08/21 1:19 PM

## 2021-12-08 ENCOUNTER — Ambulatory Visit: Payer: BC Managed Care – PPO | Admitting: Psychiatry

## 2021-12-08 VITALS — BP 145/85 | HR 72

## 2021-12-08 DIAGNOSIS — G43711 Chronic migraine without aura, intractable, with status migrainosus: Secondary | ICD-10-CM

## 2021-12-08 MED ORDER — ONDANSETRON 8 MG PO TBDP: 8.0000 mg | ORAL_TABLET | Freq: Three times a day (TID) | ORAL | 2 refills | Status: DC | PRN

## 2021-12-08 MED ORDER — ELETRIPTAN HYDROBROMIDE 40 MG PO TABS: 40.0000 mg | ORAL_TABLET | ORAL | 2 refills | Status: DC | PRN

## 2021-12-08 NOTE — Patient Instructions (Signed)
Start Relpax (eletriptan). Take at the onset of migraine. If headache recurs or does not fully resolve, you may take a second dose after 2 hours. Please avoid taking more than 2 days per week to avoid rebound headaches 2.  Take Zofran (ondansetron) every 8 hours as needed for nausea

## 2021-12-08 NOTE — Progress Notes (Signed)
Botox- 100 units x 2 vial Lot: [id number] Expiration: 10/2023 NDC: [id number]  Bacteriostatic 0.9% Sodium Chloride- 41mL total [id number] Expiration: 12/23 NDC: [id number]  Dx: [id number]  SP

## 2022-01-24 ENCOUNTER — Telehealth: Payer: Self-pay | Admitting: Psychiatry

## 2022-01-24 DIAGNOSIS — G43711 Chronic migraine without aura, intractable, with status migrainosus: Secondary | ICD-10-CM

## 2022-01-24 MED ORDER — BOTOX 100 UNITS IJ SOLR: INTRAMUSCULAR | 0 refills | Status: DC

## 2022-01-24 NOTE — Telephone Encounter (Signed)
Please send Botox RX to [facility].

## 2022-01-24 NOTE — Telephone Encounter (Signed)
Refill has been sent.  °

## 2022-01-31 ENCOUNTER — Other Ambulatory Visit: Payer: Self-pay | Admitting: Psychiatry

## 2022-02-01 ENCOUNTER — Other Ambulatory Visit: Payer: Self-pay | Admitting: Neurology

## 2022-02-10 ENCOUNTER — Ambulatory Visit (INDEPENDENT_AMBULATORY_CARE_PROVIDER_SITE_OTHER): Payer: BC Managed Care – PPO | Admitting: Family Medicine

## 2022-02-10 ENCOUNTER — Encounter: Payer: Self-pay | Admitting: Family Medicine

## 2022-02-10 VITALS — BP 120/80 | HR 66 | Temp 97.6°F | Ht 61.0 in | Wt 86.2 lb

## 2022-02-10 DIAGNOSIS — Z0000 Encounter for general adult medical examination without abnormal findings: Secondary | ICD-10-CM | POA: Diagnosis not present

## 2022-02-10 DIAGNOSIS — F1190 Opioid use, unspecified, uncomplicated: Secondary | ICD-10-CM | POA: Diagnosis not present

## 2022-02-10 LAB — CBC WITH DIFFERENTIAL/PLATELET
Basophils Absolute: 0 10*3/uL (ref 0.0–0.1)
Basophils Relative: 0.5 % (ref 0.0–3.0)
Eosinophils Absolute: 0.1 10*3/uL (ref 0.0–0.7)
Eosinophils Relative: 1.3 % (ref 0.0–5.0)
HCT: 36.7 % (ref 36.0–46.0)
Hemoglobin: 12.5 g/dL (ref 12.0–15.0)
Lymphocytes Relative: 34.8 % (ref 12.0–46.0)
Lymphs Abs: 1.5 10*3/uL (ref 0.7–4.0)
MCHC: 34.1 g/dL (ref 30.0–36.0)
MCV: 97.7 fl (ref 78.0–100.0)
Monocytes Absolute: 0.3 10*3/uL (ref 0.1–1.0)
Monocytes Relative: 6.6 % (ref 3.0–12.0)
Neutro Abs: 2.4 10*3/uL (ref 1.4–7.7)
Neutrophils Relative %: 56.8 % (ref 43.0–77.0)
Platelets: 216 10*3/uL (ref 150.0–400.0)
RBC: 3.76 Mil/uL — ABNORMAL LOW (ref 3.87–5.11)
RDW: 12.8 % (ref 11.5–15.5)
WBC: 4.3 10*3/uL (ref 4.0–10.5)

## 2022-02-10 LAB — TSH: TSH: 1.11 u[IU]/mL (ref 0.35–5.50)

## 2022-02-10 MED ORDER — HYDROCODONE-ACETAMINOPHEN 5-325 MG PO TABS: 1.0000 | ORAL_TABLET | Freq: Three times a day (TID) | ORAL | 0 refills | Status: DC | PRN

## 2022-02-10 NOTE — Addendum Note (Signed)
Addended by: Marian Sorrow D on: 02/10/2022 10:05 AM ? ? Modules accepted: Orders ? ?

## 2022-02-10 NOTE — Progress Notes (Signed)
? ?Subjective:  ? ? Patient ID: [name], female    DOB: 08/27/1966, 56 y.o.   MRN: [id number] ? ?HPI ?Here for a well exam. Her main concern today is her weight loss. She has always struggled with her weight, but she has lost about 12 lbs in the past 6 weeks. She says this is because her appetite is poor, and the reason for that is because she is in constant pain. She has chronic headaches, and we had been treating her with pain medication in years past. However over the past few years her Neurologist has been prescribing these. They recently told her they would no longer prescribe any narcotic medications, and she has been trying to get by without them. Unfortunately this causes her to deal with constant headache pain.  ?Otherwise she brings in a copy of lab work that the Neurologist had ordered through [facility] dated 10-05-21. These included a BMET, liver panel, lipid panel , and an A1c. These were normal except for an elevated creatinine at 1.18 and a GFR of 55. These have been stable for some time.  ? ?Review of Systems  ?Constitutional:  Positive for unexpected weight change.  ?HENT: Negative.    ?Eyes: Negative.   ?Respiratory: Negative.    ?Cardiovascular: Negative.   ?Gastrointestinal: Negative.   ?Genitourinary:  Negative for decreased urine volume, difficulty urinating, dyspareunia, dysuria, enuresis, flank pain, frequency, hematuria, pelvic pain and urgency.  ?Musculoskeletal: Negative.   ?Skin: Negative.   ?Neurological:  Positive for headaches.  ?Psychiatric/Behavioral: Negative.    ? ?   ?Objective:  ? Physical Exam ?Constitutional:   ?   General: She is not in acute distress. ?   Appearance: She is well-developed.  ?   Comments: She is quite thin   ?HENT:  ?   Head: Normocephalic and atraumatic.  ?   Right Ear: External ear normal.  ?   Left Ear: External ear normal.  ?   Nose: Nose normal.  ?   Mouth/Throat:  ?   Pharynx: No oropharyngeal exudate.  ?Eyes:  ?   General: No scleral icterus. ?    Conjunctiva/sclera: Conjunctivae normal.  ?   Pupils: Pupils are equal, round, and reactive to light.  ?Neck:  ?   Thyroid: No thyromegaly.  ?   Vascular: No JVD.  ?Cardiovascular:  ?   Rate and Rhythm: Normal rate and regular rhythm.  ?   Heart sounds: Normal heart sounds. No murmur heard. ?  No friction rub. No gallop.  ?Pulmonary:  ?   Effort: Pulmonary effort is normal. No respiratory distress.  ?   Breath sounds: Normal breath sounds. No wheezing or rales.  ?Chest:  ?   Chest wall: No tenderness.  ?Abdominal:  ?   General: Bowel sounds are normal. There is no distension.  ?   Palpations: Abdomen is soft. There is no mass.  ?   Tenderness: There is no abdominal tenderness. There is no guarding or rebound.  ?Musculoskeletal:     ?   General: No tenderness. Normal range of motion.  ?   Cervical back: Normal range of motion and neck supple.  ?Lymphadenopathy:  ?   Cervical: No cervical adenopathy.  ?Skin: ?   General: Skin is warm and dry.  ?   Findings: No erythema or rash.  ?Neurological:  ?   Mental Status: She is alert and oriented to person, place, and time.  ?   Cranial Nerves: No cranial nerve deficit.  ?  Motor: No abnormal muscle tone.  ?   Coordination: Coordination normal.  ?   Deep Tendon Reflexes: Reflexes are normal and symmetric. Reflexes normal.  ?Psychiatric:     ?   Behavior: Behavior normal.     ?   Thought Content: Thought content normal.     ?   Judgment: Judgment normal.  ? ? ? ? ? ?   ?Assessment & Plan:  ?Well exam. We discussed diet and exercise. We will get a CBC and a TSH today. For pain management, we will send in a 5 day supply of Norco 5-325. We will obtain a UDS today and she signed a pain contract today. Tomorrow she will have a virtual PMV with me as well. ?Gershon Crane, MD ? ? ?

## 2022-02-11 ENCOUNTER — Encounter: Payer: Self-pay | Admitting: Family Medicine

## 2022-02-11 ENCOUNTER — Telehealth (INDEPENDENT_AMBULATORY_CARE_PROVIDER_SITE_OTHER): Payer: BC Managed Care – PPO | Admitting: Family Medicine

## 2022-02-11 DIAGNOSIS — F1190 Opioid use, unspecified, uncomplicated: Secondary | ICD-10-CM | POA: Diagnosis not present

## 2022-02-11 DIAGNOSIS — G43019 Migraine without aura, intractable, without status migrainosus: Secondary | ICD-10-CM

## 2022-02-11 MED ORDER — HYDROCODONE-ACETAMINOPHEN 5-325 MG PO TABS: 1.0000 | ORAL_TABLET | Freq: Three times a day (TID) | ORAL | 0 refills | Status: DC | PRN

## 2022-02-11 MED ORDER — HYDROCODONE-ACETAMINOPHEN 5-325 MG PO TABS
1.0000 | ORAL_TABLET | Freq: Three times a day (TID) | ORAL | 0 refills | Status: DC | PRN
Start: 2022-02-11 — End: 2022-02-11

## 2022-02-11 NOTE — Progress Notes (Signed)
? ?  Subjective:  ? ? Patient ID: [name], female    DOB: [birth year], 56 y.o.   MRN: [id number] ? ?HPI ?Virtual Visit via Telephone Note ? ?I connected with the patient on 02/11/22 at  2:15 PM EST by telephone and verified that I am speaking with the correct person using two identifiers. ?  ?I discussed the limitations, risks, security and privacy concerns of performing an evaluation and management service by telephone and the availability of in person appointments. I also discussed with the patient that there may be a patient responsible charge related to this service. The patient expressed understanding and agreed to proceed. ? ?Location patient: home ?Location provider: work or home office ?Participants present for the call: patient, provider ?Patient did not have a visit in the prior 7 days to address this/these issue(s). ? ? ?History of Present Illness: ?Here for pain management. She is doing well with her headaches. They are almost constant but tolerable.  ?  ?Observations/Objective: ?Patient sounds cheerful and well on the phone. ?I do not appreciate any SOB. ?Speech and thought processing are grossly intact. ?Patient reported vitals: ? ?Assessment and Plan: ?Pain management.  ?Indication for chronic opioid: chronic headaches  ?Medication and dose: Norco 5-325 ?# pills per month: 90 ?Last UDS date: 02-10-22 ?Opioid Treatment Agreement signed (Y/N): 02-10-22 ?Opioid Treatment Agreement last reviewed with patient:  02-11-22 ?Tre[facility] reviewed this encounter (include red flags):  02-11-22 ?Meds were refilled.  ?Alysia Penna, MD ? ? ?Follow Up Instructions: ? ? ? ? ?[id number] 5-10 ?[id number] 11-20 ?[id number] 21-30 ?I did not refer this patient for an OV in the next 24 hours for this/these issue(s). ? ?I discussed the assessment and treatment plan with the patient. The patient was provided an opportunity to ask questions and all were answered. The patient agreed with the plan and demonstrated an understanding of the  instructions. ?  ?The patient was advised to call back or seek an in-person evaluation if the symptoms worsen or if the condition fails to improve as anticipated. ? ?I provided 13 minutes of non-face-to-face time during this encounter. ? ? ?Alysia Penna, MD   ? ? ?Review of Systems ? ?   ?Objective:  ? Physical Exam ? ? ? ? ?   ?Assessment & Plan:  ? ? ?

## 2022-02-12 LAB — DRUG MONITOR, PANEL 1, W/CONF, URINE
Amphetamine: NEGATIVE ng/mL (ref <250)
Amphetamines: NEGATIVE ng/mL (ref <500)
Barbiturates: NEGATIVE ng/mL (ref <300)
Benzodiazepines: NEGATIVE ng/mL (ref <100)
Cocaine Metabolite: NEGATIVE ng/mL (ref <150)
Creatinine: 157.3 mg/dL (ref >=20.0)
Marijuana Metabolite: NEGATIVE ng/mL (ref <20)
Methadone Metabolite: NEGATIVE ng/mL (ref <100)
Methamphetamine: NEGATIVE ng/mL (ref <250)
Opiates: NEGATIVE ng/mL (ref <100)
Oxidant: NEGATIVE ug/mL (ref <200)
Oxycodone: NEGATIVE ng/mL (ref <100)
Phencyclidine: NEGATIVE ng/mL (ref <25)
pH: 6.4 (ref 4.5–9.0)

## 2022-02-12 LAB — DM TEMPLATE

## 2022-02-23 ENCOUNTER — Other Ambulatory Visit: Payer: Self-pay | Admitting: Family Medicine

## 2022-02-23 DIAGNOSIS — Z1231 Encounter for screening mammogram for malignant neoplasm of breast: Secondary | ICD-10-CM

## 2022-02-24 NOTE — Telephone Encounter (Signed)
Called [facility] spoke with [name], gave verbal prescription. No reference number for the call. ?

## 2022-03-01 ENCOUNTER — Other Ambulatory Visit: Payer: Self-pay

## 2022-03-01 DIAGNOSIS — G43711 Chronic migraine without aura, intractable, with status migrainosus: Secondary | ICD-10-CM

## 2022-03-01 MED ORDER — BOTOX 100 UNITS IJ SOLR: INTRAMUSCULAR | 0 refills | Status: DC

## 2022-03-02 ENCOUNTER — Ambulatory Visit: Payer: BC Managed Care – PPO | Admitting: Psychiatry

## 2022-03-02 VITALS — BP 109/67 | HR 73

## 2022-03-02 DIAGNOSIS — G43719 Chronic migraine without aura, intractable, without status migrainosus: Secondary | ICD-10-CM | POA: Diagnosis not present

## 2022-03-02 NOTE — Progress Notes (Signed)
[name] ? ?BOTULINUM TOXIN INJECTION PROCEDURE NOTE ? ?Patient: [name] ?MRN: [id number] ? ?Indication: Chronic migraines  ? ?History: 56 year old female who follows in clinic for chronic migraines and epilepsy. Relpax and Zofran have been effective for rescue. ? ?Last injection: 12/08/21 ? ?Technique: Informed consent was obtained and signed. 200 units onabotulinumtoxinA (Lot# [id number]; Expiration: 04/2024) were reconstituted using normal saline, to a concentration of 5 units per 0.85ml. 155 units were injected using sterile technique across 31 sites as follows: corrugator 10, procerus 5 units, frontalis 20 units, temporalis 40 units, occipitalis 30 units, cervical paraspinal 20 units, trapezius 30 units. 45 units were wasted. Patient tolerated procedure without complication. ? ?Plan: ?-Rescue: Continue Relpax 40 mg, Zofran 8 mg PRN ?-Prevention: Continue Botox, zonisamide 200/300, lyrica 150 mg BID ?-Return for Botox in 3 months ?

## 2022-03-02 NOTE — Progress Notes (Signed)
Botox- 100 units x 2 vial ?Lot: [id number] ?Expiration: 04/2024 ?NDC: [phone] ? ?Bacteriostatic 0.9% Sodium Chloride- 53mL total ?NTI:[id number] ?Expiration: 07/2023 ?NDC: 4008-6761-95 ? ?Dx: K93.267 ?SP ?

## 2022-03-08 DIAGNOSIS — G43711 Chronic migraine without aura, intractable, with status migrainosus: Secondary | ICD-10-CM | POA: Diagnosis not present

## 2022-03-09 ENCOUNTER — Ambulatory Visit
Admission: RE | Admit: 2022-03-09 | Discharge: 2022-03-09 | Disposition: A | Discharge status: Home / Self Care | Payer: BC Managed Care – PPO | Source: Ambulatory Visit | Attending: Family Medicine | Admitting: Family Medicine

## 2022-03-09 DIAGNOSIS — Z1231 Encounter for screening mammogram for malignant neoplasm of breast: Secondary | ICD-10-CM | POA: Diagnosis not present

## 2022-03-09 NOTE — Telephone Encounter (Signed)
Received (2) 100 unit vials of Botox from [facility]. 

## 2022-03-10 ENCOUNTER — Other Ambulatory Visit: Payer: Self-pay | Admitting: Family Medicine

## 2022-03-10 DIAGNOSIS — R928 Other abnormal and inconclusive findings on diagnostic imaging of breast: Secondary | ICD-10-CM

## 2022-03-15 ENCOUNTER — Other Ambulatory Visit: Payer: Self-pay

## 2022-03-15 MED ORDER — PREGABALIN 150 MG PO CAPS: ORAL_CAPSULE | ORAL | 1 refills | Status: DC

## 2022-03-30 ENCOUNTER — Ambulatory Visit
Admission: RE | Admit: 2022-03-30 | Discharge: 2022-03-30 | Disposition: A | Discharge status: Home / Self Care | Payer: BC Managed Care – PPO | Source: Ambulatory Visit | Attending: Family Medicine | Admitting: Family Medicine

## 2022-03-30 DIAGNOSIS — R928 Other abnormal and inconclusive findings on diagnostic imaging of breast: Secondary | ICD-10-CM | POA: Diagnosis not present

## 2022-03-30 DIAGNOSIS — R921 Mammographic calcification found on diagnostic imaging of breast: Secondary | ICD-10-CM | POA: Diagnosis not present

## 2022-04-07 ENCOUNTER — Other Ambulatory Visit: Payer: Self-pay

## 2022-04-07 DIAGNOSIS — G43711 Chronic migraine without aura, intractable, with status migrainosus: Secondary | ICD-10-CM

## 2022-04-07 MED ORDER — BOTOX 200 UNITS IJ SOLR: 200.0000 [IU] | INTRAMUSCULAR | 11 refills | Status: DC

## 2022-04-07 MED ORDER — BOTOX 100 UNITS IJ SOLR: INTRAMUSCULAR | 0 refills | Status: DC

## 2022-04-07 NOTE — Addendum Note (Signed)
Addended by: Ocie Doyne on: 04/07/2022 11:18 AM ? ? Modules accepted: Orders ? ?

## 2022-04-07 NOTE — Telephone Encounter (Signed)
Please send Botox RX to [facility]. ?

## 2022-04-07 NOTE — Telephone Encounter (Signed)
Botox Rx sent  ?

## 2022-04-11 ENCOUNTER — Other Ambulatory Visit: Payer: Self-pay | Admitting: Family Medicine

## 2022-04-11 NOTE — Telephone Encounter (Signed)
Pt LOV was on 02/10/2022 ?Last refill was done on 10/20/2021 ?Please advise ?

## 2022-04-27 ENCOUNTER — Telehealth (INDEPENDENT_AMBULATORY_CARE_PROVIDER_SITE_OTHER): Payer: BC Managed Care – PPO | Admitting: Family Medicine

## 2022-04-27 ENCOUNTER — Encounter: Payer: Self-pay | Admitting: Family Medicine

## 2022-04-27 DIAGNOSIS — G441 Vascular headache, not elsewhere classified: Secondary | ICD-10-CM

## 2022-04-27 DIAGNOSIS — F1190 Opioid use, unspecified, uncomplicated: Secondary | ICD-10-CM

## 2022-04-27 MED ORDER — HYDROCODONE-ACETAMINOPHEN 5-325 MG PO TABS: 1.0000 | ORAL_TABLET | Freq: Three times a day (TID) | ORAL | 0 refills | Status: DC | PRN

## 2022-04-27 MED ORDER — HYDROCODONE-ACETAMINOPHEN 5-325 MG PO TABS: 1.0000 | ORAL_TABLET | Freq: Three times a day (TID) | ORAL | 0 refills | Status: AC | PRN

## 2022-04-27 NOTE — Progress Notes (Signed)
Subjective:    Patient ID: [name], female    DOB: 06-02-1966, 56 y.o.   MRN: [id number]  HPI Virtual Visit via Video Note  I connected with the patient on 04/27/22 at  1:15 PM EDT by a video enabled telemedicine application and verified that I am speaking with the correct person using two identifiers.  Location patient: home Location provider:work or home office Persons participating in the virtual visit: patient, provider  I discussed the limitations of evaluation and management by telemedicine and the availability of in person appointments. The patient expressed understanding and agreed to proceed.   HPI: Here for pain management. She says her headaches have improved. They are not as intense as before, and she actually has a few pain free days now and again.    ROS: See pertinent positives and negatives per HPI.  Past Medical History:  Diagnosis Date   Anxiety    Arthritis    Chronic constipation 01/10/2014   Depression    sees Deatra Robinson NP    Drug overdose 10/18/2014   Epilepsy (HCC)    seizures, sees Dr. Anne Hahn   Gallstones    KNLZJQBH(419.3)    migraine   Headache, common migraine, intractable 09/09/2015   Hemorrhoids    Hypertension    Insomnia    Obsessive compulsive disorder    Suicidal ideation    Suicide and self-inflicted injury (HCC) 10/18/2014    Past Surgical History:  Procedure Laterality Date   ABDOMINAL HYSTERECTOMY  2001   APPENDECTOMY Right    with ovarian cyst removal   BREAST EXCISIONAL BIOPSY     left 2008   BREAST LUMPECTOMY Left 01/2007   Dr. Abbey Chatters   CESAREAN SECTION     CHOLECYSTECTOMY     COLONOSCOPY  10/07/2019   per Dr. Leone Payor, no polyps, repeat in 10 yrs    HEMORRHOID BANDING  2015   TONSILLECTOMY      Family History  Problem Relation Age of Onset   Cirrhosis Mother    Stomach cancer Mother    Heart disease Mother    Heart disease Father    Hypertension Father    Thyroid cancer Brother    HIV Brother     Pancreatic cancer Maternal Uncle    Colon cancer Neg Hx    Esophageal cancer Neg Hx    Rectal cancer Neg Hx      Current Outpatient Medications:    Botulinum Toxin Type A (BOTOX) 200 units SOLR, Inject 200 Units as directed every 3 (three) months., Disp: 1 each, Rfl: 11   eletriptan (RELPAX) 40 MG tablet, Take 1 tablet (40 mg total) by mouth as needed for migraine or headache. May repeat in 2 hours if headache persists or recurs., Disp: 10 tablet, Rfl: 2   estrogens, conjugated, (PREMARIN) 0.625 MG tablet, TAKE 1 TABLET BY MOUTH EVERY DAY FOR 21 DAYS. DO NOT TAKE FOR 7 DAYS, Disp: 90 tablet, Rfl: 3   lurasidone (LATUDA) 80 MG TABS tablet, Take 80 mg by mouth at bedtime., Disp: , Rfl:    methocarbamol (ROBAXIN) 500 MG tablet, Take 500 mg by mouth daily., Disp: , Rfl:    ondansetron (ZOFRAN-ODT) 8 MG disintegrating tablet, Take 1 tablet (8 mg total) by mouth every 8 (eight) hours as needed for nausea or vomiting., Disp: 20 tablet, Rfl: 2   pregabalin (LYRICA) 150 MG capsule, TAKE 1 CAPSULE(150 MG) BY MOUTH TWICE DAILY, Disp: 180 capsule, Rfl: 1   trazodone (DESYREL) 300 MG tablet,  Take 1 tablet (300 mg total) by mouth at bedtime., Disp: 90 tablet, Rfl: 3   zolpidem (AMBIEN) 10 MG tablet, TAKE 1 TABLET(10 MG) BY MOUTH AT BEDTIME AS NEEDED, Disp: 30 tablet, Rfl: 5   zonisamide (ZONEGRAN) 100 MG capsule, TAKE 2 CAPSULE BY MOUTH EVERY MORNING AND 3 CAPSULE EVERY EVENING., Disp: 450 capsule, Rfl: 1   [START ON 06/12/2022] HYDROcodone-acetaminophen (NORCO) 5-325 MG tablet, Take 1 tablet by mouth every 8 (eight) hours as needed for moderate pain., Disp: 90 tablet, Rfl: 0  EXAM:  VITALS per patient if applicable:  GENERAL: alert, oriented, appears well and in no acute distress  HEENT: atraumatic, conjunttiva clear, no obvious abnormalities on inspection of external nose and ears  NECK: normal movements of the head and neck  LUNGS: on inspection no signs of respiratory distress, breathing rate  appears normal, no obvious gross SOB, gasping or wheezing  CV: no obvious cyanosis  MS: moves all visible extremities without noticeable abnormality  PSYCH/NEURO: pleasant and cooperative, no obvious depression or anxiety, speech and thought processing grossly intact  ASSESSMENT AND PLAN: Pain management. Indication for chronic opioid: headaches  Medication and dose: Norco 5-325 # pills per month: 90 Last UDS date: 02-10-22 Opioid Treatment Agreement signed (Y/N): 02-10-22 Opioid Treatment Agreement last reviewed with patient:  04-27-22 [facility] reviewed this encounter (include red flags): Yes Meds were refilled.  Gershon Crane, MD  Discussed the following assessment and plan:  No diagnosis found.     I discussed the assessment and treatment plan with the patient. The patient was provided an opportunity to ask questions and all were answered. The patient agreed with the plan and demonstrated an understanding of the instructions.   The patient was advised to call back or seek an in-person evaluation if the symptoms worsen or if the condition fails to improve as anticipated.      Review of Systems     Objective:   Physical Exam        Assessment & Plan:

## 2022-05-17 DIAGNOSIS — F250 Schizoaffective disorder, bipolar type: Secondary | ICD-10-CM | POA: Diagnosis not present

## 2022-05-25 ENCOUNTER — Ambulatory Visit: Payer: BC Managed Care – PPO | Admitting: Psychiatry

## 2022-07-06 ENCOUNTER — Telehealth: Payer: BC Managed Care – PPO | Admitting: Family Medicine

## 2022-07-13 ENCOUNTER — Encounter: Payer: Self-pay | Admitting: Family Medicine

## 2022-07-13 ENCOUNTER — Telehealth (INDEPENDENT_AMBULATORY_CARE_PROVIDER_SITE_OTHER): Payer: BC Managed Care – PPO | Admitting: Family Medicine

## 2022-07-13 DIAGNOSIS — F1190 Opioid use, unspecified, uncomplicated: Secondary | ICD-10-CM | POA: Diagnosis not present

## 2022-07-13 DIAGNOSIS — G43711 Chronic migraine without aura, intractable, with status migrainosus: Secondary | ICD-10-CM

## 2022-07-13 MED ORDER — HYDROCODONE-ACETAMINOPHEN 5-325 MG PO TABS: 1.0000 | ORAL_TABLET | Freq: Three times a day (TID) | ORAL | 0 refills | Status: DC | PRN

## 2022-07-13 NOTE — Progress Notes (Signed)
Subjective:    Patient ID: [name], female    DOB: 08/28/1966, 56 y.o.   MRN: [id number]  HPI Here for pain management. Her headaches have been fairly well controlled. She says she has gained 5 lbs and her appetite has improved.  Virtual Visit via Video Note  I connected with the patient on 07/13/22 at  1:30 PM EDT by a video enabled telemedicine application and verified that I am speaking with the correct person using two identifiers.  Location patient: home Location provider:work or home office Persons participating in the virtual visit: patient, provider  I discussed the limitations of evaluation and management by telemedicine and the availability of in person appointments. The patient expressed understanding and agreed to proceed.   HPI:    ROS: See pertinent positives and negatives per HPI.  Past Medical History:  Diagnosis Date   Anxiety    Arthritis    Chronic constipation 01/10/2014   Depression    sees Deatra Robinson NP    Drug overdose 10/18/2014   Epilepsy (HCC)    seizures, sees Dr. Anne Hahn   Gallstones    GYFVCBSW(967.5)    migraine   Headache, common migraine, intractable 09/09/2015   Hemorrhoids    Hypertension    Insomnia    Obsessive compulsive disorder    Suicidal ideation    Suicide and self-inflicted injury (HCC) 10/18/2014    Past Surgical History:  Procedure Laterality Date   ABDOMINAL HYSTERECTOMY  2001   APPENDECTOMY Right    with ovarian cyst removal   BREAST EXCISIONAL BIOPSY     left 2008   BREAST LUMPECTOMY Left 01/2007   Dr. Abbey Chatters   CESAREAN SECTION     CHOLECYSTECTOMY     COLONOSCOPY  10/07/2019   per Dr. Leone Payor, no polyps, repeat in 10 yrs    HEMORRHOID BANDING  2015   TONSILLECTOMY      Family History  Problem Relation Age of Onset   Cirrhosis Mother    Stomach cancer Mother    Heart disease Mother    Heart disease Father    Hypertension Father    Thyroid cancer Brother    HIV Brother    Pancreatic cancer  Maternal Uncle    Colon cancer Neg Hx    Esophageal cancer Neg Hx    Rectal cancer Neg Hx      Current Outpatient Medications:    Botulinum Toxin Type A (BOTOX) 200 units SOLR, Inject 200 Units as directed every 3 (three) months., Disp: 1 each, Rfl: 11   eletriptan (RELPAX) 40 MG tablet, Take 1 tablet (40 mg total) by mouth as needed for migraine or headache. May repeat in 2 hours if headache persists or recurs., Disp: 10 tablet, Rfl: 2   estrogens, conjugated, (PREMARIN) 0.625 MG tablet, TAKE 1 TABLET BY MOUTH EVERY DAY FOR 21 DAYS. DO NOT TAKE FOR 7 DAYS, Disp: 90 tablet, Rfl: 3   lurasidone (LATUDA) 80 MG TABS tablet, Take 80 mg by mouth at bedtime., Disp: , Rfl:    methocarbamol (ROBAXIN) 500 MG tablet, Take 500 mg by mouth daily., Disp: , Rfl:    ondansetron (ZOFRAN-ODT) 8 MG disintegrating tablet, Take 1 tablet (8 mg total) by mouth every 8 (eight) hours as needed for nausea or vomiting., Disp: 20 tablet, Rfl: 2   pregabalin (LYRICA) 150 MG capsule, TAKE 1 CAPSULE(150 MG) BY MOUTH TWICE DAILY, Disp: 180 capsule, Rfl: 1   trazodone (DESYREL) 300 MG tablet, Take 1 tablet (300 mg  total) by mouth at bedtime., Disp: 90 tablet, Rfl: 3   zolpidem (AMBIEN) 10 MG tablet, TAKE 1 TABLET(10 MG) BY MOUTH AT BEDTIME AS NEEDED, Disp: 30 tablet, Rfl: 5   zonisamide (ZONEGRAN) 100 MG capsule, TAKE 2 CAPSULE BY MOUTH EVERY MORNING AND 3 CAPSULE EVERY EVENING., Disp: 450 capsule, Rfl: 1  EXAM:  VITALS per patient if applicable:  GENERAL: alert, oriented, appears well and in no acute distress  HEENT: atraumatic, conjunttiva clear, no obvious abnormalities on inspection of external nose and ears  NECK: normal movements of the head and neck  LUNGS: on inspection no signs of respiratory distress, breathing rate appears normal, no obvious gross SOB, gasping or wheezing  CV: no obvious cyanosis  MS: moves all visible extremities without noticeable abnormality  PSYCH/NEURO: pleasant and cooperative,  no obvious depression or anxiety, speech and thought processing grossly intact  ASSESSMENT AND PLAN: Pain management. Indication for chronic opioid: headaches Medication and dose: Norco 5-325 # pills per month: 90 Last UDS date: 02-10-22 Opioid Treatment Agreement signed (Y/N): 02-10-22 Opioid Treatment Agreement last reviewed with patient:  07-13-22 [facility] reviewed this encounter (include red flags): Yes Meds were refilled.  Gershon Crane, MD  Discussed the following assessment and plan:  No diagnosis found.     I discussed the assessment and treatment plan with the patient. The patient was provided an opportunity to ask questions and all were answered. The patient agreed with the plan and demonstrated an understanding of the instructions.   The patient was advised to call back or seek an in-person evaluation if the symptoms worsen or if the condition fails to improve as anticipated.      Review of Systems     Objective:   Physical Exam        Assessment & Plan:

## 2022-08-12 ENCOUNTER — Telehealth: Payer: Self-pay | Admitting: Psychiatry

## 2022-08-12 NOTE — Telephone Encounter (Signed)
Pt is needing a refill request for her zonisamide (ZONEGRAN) 100 MG capsule sent to the [facility] on [address] and [address]

## 2022-08-15 ENCOUNTER — Other Ambulatory Visit: Payer: Self-pay

## 2022-08-15 MED ORDER — ZONISAMIDE 100 MG PO CAPS
ORAL_CAPSULE | ORAL | 1 refills | Status: DC
Start: 2022-08-15 — End: 2022-10-18

## 2022-08-15 NOTE — Telephone Encounter (Signed)
Approved.  

## 2022-08-24 DIAGNOSIS — F250 Schizoaffective disorder, bipolar type: Secondary | ICD-10-CM | POA: Diagnosis not present

## 2022-09-16 ENCOUNTER — Other Ambulatory Visit: Payer: Self-pay | Admitting: Psychiatry

## 2022-09-16 NOTE — Telephone Encounter (Signed)
Pt is needing a refill request for her pregabalin (LYRICA) 150 MG capsule sent in to the [facility] on [address].

## 2022-09-19 MED ORDER — PREGABALIN 150 MG PO CAPS
ORAL_CAPSULE | ORAL | 1 refills | Status: DC
Start: 2022-09-19 — End: 2023-03-14

## 2022-09-19 NOTE — Telephone Encounter (Signed)
Called patient and advised she canceled last Botox, doesn't have FU scheduled. She would like to schedule FU appointment. She is agreeable to see NP for her seizures, migraines. Scheduled with Janett Billow. I advised her the Lyrica can be refilled. Patient verbalized understanding, appreciation.

## 2022-09-21 ENCOUNTER — Encounter: Payer: Self-pay | Admitting: Family Medicine

## 2022-09-21 ENCOUNTER — Telehealth (INDEPENDENT_AMBULATORY_CARE_PROVIDER_SITE_OTHER): Payer: BC Managed Care – PPO | Admitting: Family Medicine

## 2022-09-21 DIAGNOSIS — G441 Vascular headache, not elsewhere classified: Secondary | ICD-10-CM | POA: Diagnosis not present

## 2022-09-21 DIAGNOSIS — F1190 Opioid use, unspecified, uncomplicated: Secondary | ICD-10-CM

## 2022-09-21 MED ORDER — HYDROCODONE-ACETAMINOPHEN 5-325 MG PO TABS: 1.0000 | ORAL_TABLET | Freq: Three times a day (TID) | ORAL | 0 refills | Status: DC | PRN

## 2022-09-21 NOTE — Progress Notes (Signed)
   Subjective:    Patient ID: [name], female    DOB: 01-26-1966, 56 y.o.   MRN: [id number]  HPI Virtual Visit via Telephone Note  I connected with the patient on 09/21/22 at  1:15 PM EDT by telephone and verified that I am speaking with the correct person using two identifiers.   I discussed the limitations, risks, security and privacy concerns of performing an evaluation and management service by telephone and the availability of in person appointments. I also discussed with the patient that there may be a patient responsible charge related to this service. The patient expressed understanding and agreed to proceed.  Location patient: home Location provider: work or home office Participants present for the call: patient, provider Patient did not have a visit in the prior 7 days to address this/these issue(s).   History of Present Illness: Here for pain management. She is doing well with her headaches. Her depression has been stable so that helps.   Observations/Objective: Patient sounds cheerful and well on the phone. I do not appreciate any SOB. Speech and thought processing are grossly intact. Patient reported vitals:  Assessment and Plan: Pain management. Indication for chronic opioid: headaches Medication and dose: Norco 5-325  # pills per month: 90 Last UDS date: 02-10-22 Opioid Treatment Agreement signed (Y/N): 02-10-22 Opioid Treatment Agreement last reviewed with patient:  09-21-22 [facility] reviewed this encounter (include red flags): Yes Meds were refilled. Alysia Penna, MD   Follow Up Instructions:     [phone] 5-10 [phone] 11-20 9443 21-30 I did not refer this patient for an OV in the next 24 hours for this/these issue(s).  I discussed the assessment and treatment plan with the patient. The patient was provided an opportunity to ask questions and all were answered. The patient agreed with the plan and demonstrated an understanding of the instructions.    The patient was advised to call back or seek an in-person evaluation if the symptoms worsen or if the condition fails to improve as anticipated.  I provided 16 minutes of non-face-to-face time during this encounter.   Alysia Penna, MD     Review of Systems     Objective:   Physical Exam        Assessment & Plan:

## 2022-10-06 ENCOUNTER — Other Ambulatory Visit: Payer: Self-pay | Admitting: Family Medicine

## 2022-10-17 NOTE — Progress Notes (Unsigned)
[name] [address] [place]. [place] [address] [phone]       OFFICE FOLLOW UP NOTE  [name] Date of Birth:  1966-01-24 Medical Record Number:  [id number]   Current neurologist: Dr. Delena Bali Reason for visit: Migraine headaches    SUBJECTIVE:   CHIEF COMPLAINT:  No chief complaint on file.   HPI:   Update 10/18/2022 JM: [name] is a 56 y.o. female with longstanding history of migraine headaches.  Patient returns for medication management.  She was previously seen by Dr. Delena Bali on 03/02/2022 for Botox injections.  She also remains on zonisamide 200/300 and pregabalin 150 mg twice daily as preventative and Relpax as rescue.  Reports current migraine headaches ***.            ROS:   14 system review of systems performed and negative with exception of ***  PMH:  Past Medical History:  Diagnosis Date   Anxiety    Arthritis    Chronic constipation 01/10/2014   Depression    sees Deatra Robinson NP    Drug overdose 10/18/2014   Epilepsy (HCC)    seizures, sees Dr. Anne Hahn   Gallstones    DQQIWLNL(892.1)    migraine   Headache, common migraine, intractable 09/09/2015   Hemorrhoids    Hypertension    Insomnia    Obsessive compulsive disorder    Suicidal ideation    Suicide and self-inflicted injury (HCC) 10/18/2014    PSH:  Past Surgical History:  Procedure Laterality Date   ABDOMINAL HYSTERECTOMY  2001   APPENDECTOMY Right    with ovarian cyst removal   BREAST EXCISIONAL BIOPSY     left 2008   BREAST LUMPECTOMY Left 01/2007   Dr. Abbey Chatters   CESAREAN SECTION     CHOLECYSTECTOMY     COLONOSCOPY  10/07/2019   per Dr. Leone Payor, no polyps, repeat in 10 yrs    HEMORRHOID BANDING  2015   TONSILLECTOMY      Social History:  Social History   Socioeconomic History   Marital status: Married    Spouse name: Not on file   Number of children: 2   Years of education: 13   Highest education level: Not on file   Occupational History   Occupation: homemaker    Employer: UNEMPLOYED  Tobacco Use   Smoking status: Never   Smokeless tobacco: Never  Vaping Use   Vaping Use: Never used  Substance and Sexual Activity   Alcohol use: Yes    Alcohol/week: 0.0 standard drinks of alcohol    Comment: rare   Drug use: No   Sexual activity: Not on file  Other Topics Concern   Not on file  Social History Narrative   Married 2 sons homemaker   4-5 caffeinated beverages daily ([employer]eps[employer])   Patient is right handed.   Social Determinants of Health   Financial Resource Strain: Not on file  Food Insecurity: Not on file  Transportation Needs: Not on file  Physical Activity: Not on file  Stress: Not on file  Social Connections: Not on file  Intimate Partner Violence: Not on file    Family History:  Family History  Problem Relation Age of Onset   Cirrhosis Mother    Stomach cancer Mother    Heart disease Mother    Heart disease Father    Hypertension Father    Thyroid cancer Brother    HIV Brother    Pancreatic cancer Maternal Uncle  Colon cancer Neg Hx    Esophageal cancer Neg Hx    Rectal cancer Neg Hx     Medications:   [name]  Medication Sig Dispense Refill   Botulinum Toxin Type A (BOTOX) 200 units SOLR Inject 200 Units as directed every 3 (three) months. 1 each 11   eletriptan (RELPAX) 40 MG tablet Take 1 tablet (40 mg total) by mouth as needed for migraine or headache. May repeat in 2 hours if headache persists or recurs. 10 tablet 2   estrogens, conjugated, (PREMARIN) 0.625 MG tablet TAKE 1 TABLET BY MOUTH EVERY DAY FOR 21 DAYS. DO NOT TAKE FOR 7 DAYS 90 tablet 3   [START ON 12/12/2022] HYDROcodone-acetaminophen (NORCO) 5-325 MG tablet Take 1 tablet by mouth every 8 (eight) hours as needed for moderate pain. 90 tablet 0   lurasidone (LATUDA) 80 MG TABS tablet Take 80 mg by mouth at bedtime.     methocarbamol (ROBAXIN) 500 MG tablet Take 500  mg by mouth daily.     ondansetron (ZOFRAN-ODT) 8 MG disintegrating tablet Take 1 tablet (8 mg total) by mouth every 8 (eight) hours as needed for nausea or vomiting. 20 tablet 2   pregabalin (LYRICA) 150 MG capsule TAKE 1 CAPSULE(150 MG) BY MOUTH TWICE DAILY 180 capsule 1   trazodone (DESYREL) 300 MG tablet Take 1 tablet (300 mg total) by mouth at bedtime. 90 tablet 3   zolpidem (AMBIEN) 10 MG tablet TAKE 1 TABLET(10 MG) BY MOUTH AT BEDTIME AS NEEDED 30 tablet 5   zonisamide (ZONEGRAN) 100 MG capsule TAKE 2 CAPSULE BY MOUTH EVERY MORNING AND 3 CAPSULE EVERY EVENING. 450 capsule 1   No current facility-administered medications on file prior to visit.    Allergies:   Allergies  Allergen Reactions   Vimpat [Lacosamide]     Increased depression      OBJECTIVE:  Physical Exam  There were no vitals filed for this visit. There is no height or weight on file to calculate BMI. No results found.   General: well developed, well nourished, seated, in no evident distress Head: head normocephalic and atraumatic.   Neck: supple with no carotid or supraclavicular bruits Cardiovascular: regular rate and rhythm, no murmurs Musculoskeletal: no deformity Skin:  no rash/petichiae Vascular:  Normal pulses all extremities   Neurologic Exam Mental Status: Awake and fully alert. Oriented to place and time. Recent and remote memory intact. Attention span, concentration and fund of knowledge appropriate. Mood and affect appropriate.  Cranial Nerves: Pupils equal, briskly reactive to light. Extraocular movements full without nystagmus. Visual fields full to confrontation. Hearing intact. Facial sensation intact. Face, tongue, palate moves normally and symmetrically.  Motor: Normal bulk and tone. Normal strength in all tested extremity muscles Sensory.: intact to touch , pinprick , position and vibratory sensation.  Coordination: Rapid alternating movements normal in all extremities. Finger-to-nose and  heel-to-shin performed accurately bilaterally. Gait and Station: Arises from chair without difficulty. Stance is normal. Gait demonstrates normal stride length and balance without use of AD. Tandem walk and heel toe without difficulty.  Reflexes: 1+ and symmetric. Toes downgoing.         ASSESSMENT/PLAN: [name] is a 56 y.o. year old female   1.  Chronic migraine headaches  -Preventative: continue zonisamide 200/300 and pregabalin 150 mg BID  -Rescue: Continue Relpax as needed, zofran as needed     Follow up in *** or call earlier if needed   CC:  PCP: Nelwyn Salisbury, MD    I spent *** minutes of face-to-face and non-face-to-face time with patient.  This included previsit chart review, lab review, study review, order entry, electronic health record documentation, patient education regarding ***   Ihor Austin, AGNP-BC  [facility] [address] [place], [place] [address]  Phone [phone] Fax [phone] Note: This document was prepared with digital dictation and possible smart phrase technology. Any transcriptional errors that result from this process are unintentional.

## 2022-10-18 ENCOUNTER — Encounter: Payer: Self-pay | Admitting: Adult Health

## 2022-10-18 ENCOUNTER — Ambulatory Visit: Payer: BC Managed Care – PPO | Admitting: Adult Health

## 2022-10-18 VITALS — BP 142/77 | HR 65

## 2022-10-18 DIAGNOSIS — G43711 Chronic migraine without aura, intractable, with status migrainosus: Secondary | ICD-10-CM

## 2022-10-18 MED ORDER — ONDANSETRON 8 MG PO TBDP: 8.0000 mg | ORAL_TABLET | Freq: Three times a day (TID) | ORAL | 11 refills | Status: DC | PRN

## 2022-10-18 MED ORDER — ZONISAMIDE 100 MG PO CAPS
ORAL_CAPSULE | ORAL | 11 refills | Status: DC
Start: 2022-10-18 — End: 2023-07-19

## 2022-10-18 NOTE — Patient Instructions (Addendum)
Your Plan:  Continue zonisamide and pregablin for preventative   Please let me know if you would to try a different preventative medication such as Vypeti   Please let me know if you are interested in trying a different rescue medication Continue use of Zofran as needed     Follow up in 9 months or call earlier if needed     Thank you for coming to see [place] at [facility]. I hope we have been able to provide you high quality care today.  You may receive a patient satisfaction survey over the next few weeks. We would appreciate your feedback and comments so that we may continue to improve ourselves and the health of our patients.

## 2022-10-26 DIAGNOSIS — H53143 Visual discomfort, bilateral: Secondary | ICD-10-CM | POA: Diagnosis not present

## 2022-11-08 DIAGNOSIS — H16223 Keratoconjunctivitis sicca, not specified as Sjogren's, bilateral: Secondary | ICD-10-CM | POA: Diagnosis not present

## 2022-12-20 ENCOUNTER — Telehealth (INDEPENDENT_AMBULATORY_CARE_PROVIDER_SITE_OTHER): Payer: BC Managed Care – PPO | Admitting: Family Medicine

## 2022-12-20 ENCOUNTER — Encounter: Payer: Self-pay | Admitting: Family Medicine

## 2022-12-20 DIAGNOSIS — F1190 Opioid use, unspecified, uncomplicated: Secondary | ICD-10-CM

## 2022-12-20 DIAGNOSIS — G43019 Migraine without aura, intractable, without status migrainosus: Secondary | ICD-10-CM

## 2022-12-20 MED ORDER — HYDROCODONE-ACETAMINOPHEN 5-325 MG PO TABS: 1.0000 | ORAL_TABLET | Freq: Three times a day (TID) | ORAL | 0 refills | Status: DC | PRN

## 2022-12-20 NOTE — Progress Notes (Signed)
Subjective:    Patient ID: [name], female    DOB: 03-12-1966, 57 y.o.   MRN: [id number]  HPI Virtual Visit via Video Note  I connected with the patient on 12/20/22 at  3:30 PM EST by a video enabled telemedicine application and verified that I am speaking with the correct person using two identifiers.  Location patient: home Location provider:work or home office Persons participating in the virtual visit: patient, provider  I discussed the limitations of evaluation and management by telemedicine and the availability of in person appointments. The patient expressed understanding and agreed to proceed.   HPI: Here for pain management, she  is doing fairly well.    ROS: See pertinent positives and negatives per HPI.  Past Medical History:  Diagnosis Date   Anxiety    Arthritis    Chronic constipation 01/10/2014   Depression    sees Pauline Good NP    Drug overdose 10/18/2014   Epilepsy ([place])    seizures, sees Dr. Jannifer Franklin   Gallstones    [id number](010.0)    migraine   Headache, common migraine, intractable 09/09/2015   Hemorrhoids    Hypertension    Insomnia    Obsessive compulsive disorder    Suicidal ideation    Suicide and self-inflicted injury ([place]) 71/21/9758    Past Surgical History:  Procedure Laterality Date   ABDOMINAL HYSTERECTOMY  2001   APPENDECTOMY Right    with ovarian cyst removal   BREAST EXCISIONAL BIOPSY     left 2008   BREAST LUMPECTOMY Left 01/2007   Dr. Zella Richer   CESAREAN SECTION     CHOLECYSTECTOMY     COLONOSCOPY  10/07/2019   per Dr. Carlean Purl, no polyps, repeat in 10 yrs    HEMORRHOID BANDING  2015   TONSILLECTOMY      Family History  Problem Relation Age of Onset   Cirrhosis Mother    Stomach cancer Mother    Heart disease Mother    Heart disease Father    Hypertension Father    Thyroid cancer Brother    HIV Brother    Pancreatic cancer Maternal Uncle    Colon cancer Neg Hx    Esophageal cancer Neg Hx    Rectal  cancer Neg Hx      Current Outpatient Medications:    estrogens, conjugated, (PREMARIN) 0.625 MG tablet, TAKE 1 TABLET BY MOUTH EVERY DAY FOR 21 DAYS. DO NOT TAKE FOR 7 DAYS, Disp: 90 tablet, Rfl: 3   HYDROcodone-acetaminophen (NORCO) 5-325 MG tablet, Take 1 tablet by mouth every 8 (eight) hours as needed for moderate pain., Disp: 90 tablet, Rfl: 0   lurasidone (LATUDA) 80 MG TABS tablet, Take 80 mg by mouth at bedtime., Disp: , Rfl:    methocarbamol (ROBAXIN) 500 MG tablet, Take 500 mg by mouth daily., Disp: , Rfl:    ondansetron (ZOFRAN-ODT) 8 MG disintegrating tablet, Take 1 tablet (8 mg total) by mouth every 8 (eight) hours as needed for nausea or vomiting., Disp: 20 tablet, Rfl: 11   pregabalin (LYRICA) 150 MG capsule, TAKE 1 CAPSULE(150 MG) BY MOUTH TWICE DAILY, Disp: 180 capsule, Rfl: 1   trazodone (DESYREL) 300 MG tablet, Take 1 tablet (300 mg total) by mouth at bedtime., Disp: 90 tablet, Rfl: 3   zolpidem (AMBIEN) 10 MG tablet, TAKE 1 TABLET(10 MG) BY MOUTH AT BEDTIME AS NEEDED, Disp: 30 tablet, Rfl: 5   zonisamide (ZONEGRAN) 100 MG capsule, TAKE 2 CAPSULE BY MOUTH EVERY MORNING AND 3  CAPSULE EVERY EVENING., Disp: 450 capsule, Rfl: 11  EXAM:  VITALS per patient if applicable:  GENERAL: alert, oriented, appears well and in no acute distress  HEENT: atraumatic, conjunttiva clear, no obvious abnormalities on inspection of external nose and ears  NECK: normal movements of the head and neck  LUNGS: on inspection no signs of respiratory distress, breathing rate appears normal, no obvious gross SOB, gasping or wheezing  CV: no obvious cyanosis  MS: moves all visible extremities without noticeable abnormality  PSYCH/NEURO: pleasant and cooperative, no obvious depression or anxiety, speech and thought processing grossly intact  ASSESSMENT AND PLAN: Pain management. Indication for chronic opioid: headaches  Medication and dose: Norco 5-325 # pills per month: 90 Last UDS date:  02-10-22 Opioid Treatment Agreement signed (Y/N): 02-10-22 Opioid Treatment Agreement last reviewed with patient:  12-20-22 NCCSRS reviewed this encounter (include red flags): Yes Meds were refilled. Alysia Penna, MD  Discussed the following assessment and plan:  No diagnosis found.     I discussed the assessment and treatment plan with the patient. The patient was provided an opportunity to ask questions and all were answered. The patient agreed with the plan and demonstrated an understanding of the instructions.   The patient was advised to call back or seek an in-person evaluation if the symptoms worsen or if the condition fails to improve as anticipated.      Review of Systems     Objective:   Physical Exam        Assessment & Plan:

## 2022-12-21 DIAGNOSIS — F250 Schizoaffective disorder, bipolar type: Secondary | ICD-10-CM | POA: Diagnosis not present

## 2023-01-13 ENCOUNTER — Other Ambulatory Visit: Payer: Self-pay | Admitting: Family Medicine

## 2023-01-13 NOTE — Telephone Encounter (Signed)
Last refill-10/20/21 Last VV-12/20/22  Next OV CPE-02/14/2023

## 2023-02-14 ENCOUNTER — Ambulatory Visit (INDEPENDENT_AMBULATORY_CARE_PROVIDER_SITE_OTHER): Payer: BC Managed Care – PPO | Admitting: Family Medicine

## 2023-02-14 ENCOUNTER — Encounter: Payer: Self-pay | Admitting: Family Medicine

## 2023-02-14 VITALS — BP 126/80 | HR 62 | Temp 97.7°F | Ht 62.25 in | Wt 108.0 lb

## 2023-02-14 DIAGNOSIS — Z0000 Encounter for general adult medical examination without abnormal findings: Secondary | ICD-10-CM

## 2023-02-14 DIAGNOSIS — R7989 Other specified abnormal findings of blood chemistry: Secondary | ICD-10-CM

## 2023-02-14 LAB — HEMOGLOBIN A1C: Hgb A1c MFr Bld: 5.4 % (ref 4.6–6.5)

## 2023-02-14 LAB — CBC WITH DIFFERENTIAL/PLATELET
Basophils Absolute: 0 10*3/uL (ref 0.0–0.1)
Basophils Relative: 0.8 % (ref 0.0–3.0)
Eosinophils Absolute: 0.1 10*3/uL (ref 0.0–0.7)
Eosinophils Relative: 2.2 % (ref 0.0–5.0)
HCT: 38.2 % (ref 36.0–46.0)
Hemoglobin: 12.7 g/dL (ref 12.0–15.0)
Lymphocytes Relative: 29.2 % (ref 12.0–46.0)
Lymphs Abs: 1.5 10*3/uL (ref 0.7–4.0)
MCHC: 33.1 g/dL (ref 30.0–36.0)
MCV: 98.6 fl (ref 78.0–100.0)
Monocytes Absolute: 0.4 10*3/uL (ref 0.1–1.0)
Monocytes Relative: 7.6 % (ref 3.0–12.0)
Neutro Abs: 3 10*3/uL (ref 1.4–7.7)
Neutrophils Relative %: 60.2 % (ref 43.0–77.0)
Platelets: 291 10*3/uL (ref 150.0–400.0)
RBC: 3.88 Mil/uL (ref 3.87–5.11)
RDW: 12.7 % (ref 11.5–15.5)
WBC: 5 10*3/uL (ref 4.0–10.5)

## 2023-02-14 LAB — BASIC METABOLIC PANEL
BUN: 17 mg/dL (ref 6–23)
CO2: 24 mEq/L (ref 19–32)
Calcium: 9.1 mg/dL (ref 8.4–10.5)
Chloride: 104 mEq/L (ref 96–112)
Creatinine, Ser: 1.24 mg/dL — ABNORMAL HIGH (ref 0.40–1.20)
GFR: 48.75 mL/min — ABNORMAL LOW (ref >60.00)
Glucose, Bld: 100 mg/dL — ABNORMAL HIGH (ref 70–99)
Potassium: 3.7 mEq/L (ref 3.5–5.1)
Sodium: 139 mEq/L (ref 135–145)

## 2023-02-14 LAB — HEPATIC FUNCTION PANEL
ALT: 7 U/L (ref 0–35)
AST: 14 U/L (ref 0–37)
Albumin: 3.8 g/dL (ref 3.5–5.2)
Alkaline Phosphatase: 54 U/L (ref 39–117)
Bilirubin, Direct: 0 mg/dL (ref 0.0–0.3)
Total Bilirubin: 0.4 mg/dL (ref 0.2–1.2)
Total Protein: 7 g/dL (ref 6.0–8.3)

## 2023-02-14 LAB — LIPID PANEL
Cholesterol: 174 mg/dL (ref 0–200)
HDL: 62.4 mg/dL (ref >39.00)
LDL Cholesterol: 83 mg/dL (ref 0–99)
NonHDL: 111.14
Total CHOL/HDL Ratio: 3
Triglycerides: 139 mg/dL (ref 0.0–149.0)
VLDL: 27.8 mg/dL (ref 0.0–40.0)

## 2023-02-14 LAB — TSH: TSH: 1.23 u[IU]/mL (ref 0.35–5.50)

## 2023-02-14 NOTE — Progress Notes (Signed)
   Subjective:    Patient ID: [name], female    DOB: 05-09-1966, 57 y.o.   MRN: [id number]  HPI Here for a well exam. She feels well and has no concerns. She still sees Pauline Good NP for psychiatric care. Her husband controls all her medications.    Review of Systems  Constitutional: Negative.   HENT: Negative.    Eyes: Negative.   Respiratory: Negative.    Cardiovascular: Negative.   Gastrointestinal: Negative.   Genitourinary:  Negative for decreased urine volume, difficulty urinating, dyspareunia, dysuria, enuresis, flank pain, frequency, hematuria, pelvic pain and urgency.  Musculoskeletal: Negative.   Skin: Negative.   Neurological:  Positive for headaches.  Psychiatric/Behavioral: Negative.         Objective:   Physical Exam Constitutional:      General: She is not in acute distress.    Appearance: Normal appearance. She is well-developed.  HENT:     Head: Normocephalic and atraumatic.     Right Ear: External ear normal.     Left Ear: External ear normal.     Nose: Nose normal.     Mouth/Throat:     Pharynx: No oropharyngeal exudate.  Eyes:     General: No scleral icterus.    Conjunctiva/sclera: Conjunctivae normal.     Pupils: Pupils are equal, round, and reactive to light.  Neck:     Thyroid: No thyromegaly.     Vascular: No JVD.  Cardiovascular:     Rate and Rhythm: Normal rate and regular rhythm.     Heart sounds: Normal heart sounds. No murmur heard.    No friction rub. No gallop.  Pulmonary:     Effort: Pulmonary effort is normal. No respiratory distress.     Breath sounds: Normal breath sounds. No wheezing or rales.  Chest:     Chest wall: No tenderness.  Abdominal:     General: Bowel sounds are normal. There is no distension.     Palpations: Abdomen is soft. There is no mass.     Tenderness: There is no abdominal tenderness. There is no guarding or rebound.  Musculoskeletal:        General: No tenderness. Normal range of motion.      Cervical back: Normal range of motion and neck supple.  Lymphadenopathy:     Cervical: No cervical adenopathy.  Skin:    General: Skin is warm and dry.     Findings: No erythema or rash.  Neurological:     Mental Status: She is alert and oriented to person, place, and time.     Cranial Nerves: No cranial nerve deficit.     Motor: No abnormal muscle tone.     Coordination: Coordination normal.     Deep Tendon Reflexes: Reflexes are normal and symmetric. Reflexes normal.  Psychiatric:        Behavior: Behavior normal.        Thought Content: Thought content normal.        Judgment: Judgment normal.           Assessment & Plan:  Well exam. We discussed diet and exercise. Get fasting labs. Alysia Penna, MD

## 2023-02-15 NOTE — Addendum Note (Signed)
Addended by: Agnes Lawrence on: 02/15/2023 09:50 AM   Modules accepted: Orders

## 2023-03-14 ENCOUNTER — Other Ambulatory Visit: Payer: Self-pay

## 2023-03-14 DIAGNOSIS — F250 Schizoaffective disorder, bipolar type: Secondary | ICD-10-CM | POA: Diagnosis not present

## 2023-03-14 MED ORDER — PREGABALIN 150 MG PO CAPS: ORAL_CAPSULE | ORAL | 1 refills | Status: DC

## 2023-03-21 ENCOUNTER — Telehealth (INDEPENDENT_AMBULATORY_CARE_PROVIDER_SITE_OTHER): Payer: BC Managed Care – PPO | Admitting: Family Medicine

## 2023-03-21 ENCOUNTER — Encounter: Payer: Self-pay | Admitting: Family Medicine

## 2023-03-21 DIAGNOSIS — G43711 Chronic migraine without aura, intractable, with status migrainosus: Secondary | ICD-10-CM | POA: Diagnosis not present

## 2023-03-21 DIAGNOSIS — F1190 Opioid use, unspecified, uncomplicated: Secondary | ICD-10-CM

## 2023-03-21 MED ORDER — HYDROCODONE-ACETAMINOPHEN 5-325 MG PO TABS: 1.0000 | ORAL_TABLET | Freq: Three times a day (TID) | ORAL | 0 refills | Status: DC | PRN

## 2023-03-21 NOTE — Progress Notes (Signed)
Subjective:    Patient ID: [name], female    DOB: [birth year], 57 y.o.   MRN: [id number]  HPI Virtual Visit via Video Note  I connected with the patient on 03/21/23 at  1:00 PM EDT by a video enabled telemedicine application and verified that I am speaking with the correct person using two identifiers.  Location patient: home Location provider:work or home office Persons participating in the virtual visit: patient, provider  I discussed the limitations of evaluation and management by telemedicine and the availability of in person appointments. The patient expressed understanding and agreed to proceed.   HPI: Here for pain management. She is doing well.    ROS: See pertinent positives and negatives per HPI.  Past Medical History:  Diagnosis Date   Anxiety    Arthritis    Chronic constipation 01/10/2014   Depression    sees Deatra Robinson NP    Drug overdose 10/18/2014   Epilepsy    seizures, sees Dr. Anne Hahn   Gallstones    [id number](478.2)    migraine   Headache, common migraine, intractable 09/09/2015   Hemorrhoids    Hypertension    Insomnia    Obsessive compulsive disorder    Suicidal ideation    Suicide and self-inflicted injury 10/18/2014    Past Surgical History:  Procedure Laterality Date   ABDOMINAL HYSTERECTOMY  2001   APPENDECTOMY Right    with ovarian cyst removal   BREAST EXCISIONAL BIOPSY     left 2008   BREAST LUMPECTOMY Left 01/2007   Dr. Abbey Chatters   CESAREAN SECTION     CHOLECYSTECTOMY     COLONOSCOPY  10/07/2019   per Dr. Leone Payor, no polyps, repeat in 10 yrs    HEMORRHOID BANDING  2015   TONSILLECTOMY      Family History  Problem Relation Age of Onset   Cirrhosis Mother    Stomach cancer Mother    Heart disease Mother    Heart disease Father    Hypertension Father    Thyroid cancer Brother    HIV Brother    Pancreatic cancer Maternal Uncle    Colon cancer Neg Hx    Esophageal cancer Neg Hx    Rectal cancer Neg Hx       Current Outpatient Medications:    estrogens, conjugated, (PREMARIN) 0.625 MG tablet, TAKE 1 TABLET BY MOUTH EVERY DAY FOR 21 DAYS. DO NOT TAKE FOR 7 DAYS, Disp: 90 tablet, Rfl: 3   lurasidone (LATUDA) 80 MG TABS tablet, Take 80 mg by mouth at bedtime., Disp: , Rfl:    methocarbamol (ROBAXIN) 500 MG tablet, Take 500 mg by mouth daily., Disp: , Rfl:    ondansetron (ZOFRAN-ODT) 8 MG disintegrating tablet, Take 1 tablet (8 mg total) by mouth every 8 (eight) hours as needed for nausea or vomiting., Disp: 20 tablet, Rfl: 11   pregabalin (LYRICA) 150 MG capsule, TAKE 1 CAPSULE(150 MG) BY MOUTH TWICE DAILY, Disp: 180 capsule, Rfl: 1   trazodone (DESYREL) 300 MG tablet, Take 1 tablet (300 mg total) by mouth at bedtime., Disp: 90 tablet, Rfl: 3   zolpidem (AMBIEN) 10 MG tablet, TAKE 1 TABLET(10 MG) BY MOUTH AT BEDTIME AS NEEDED, Disp: 30 tablet, Rfl: 5   zonisamide (ZONEGRAN) 100 MG capsule, TAKE 2 CAPSULE BY MOUTH EVERY MORNING AND 3 CAPSULE EVERY EVENING., Disp: 450 capsule, Rfl: 11   [START ON 06/11/2023] HYDROcodone-acetaminophen (NORCO) 5-325 MG tablet, Take 1 tablet by mouth every 8 (eight) hours as needed for  moderate pain., Disp: 90 tablet, Rfl: 0  EXAM:  VITALS per patient if applicable:  GENERAL: alert, oriented, appears well and in no acute distress  HEENT: atraumatic, conjunttiva clear, no obvious abnormalities on inspection of external nose and ears  NECK: normal movements of the head and neck  LUNGS: on inspection no signs of respiratory distress, breathing rate appears normal, no obvious gross SOB, gasping or wheezing  CV: no obvious cyanosis  MS: moves all visible extremities without noticeable abnormality  PSYCH/NEURO: pleasant and cooperative, no obvious depression or anxiety, speech and thought processing grossly intact  ASSESSMENT AND PLAN: Pain management. Indication for chronic opioid: headaches Medication and dose: Norco 5-325 # pills per month: 90 Last UDS date:  02-10-22 Opioid Treatment Agreement signed (Y/N): 02-10-22 Opioid Treatment Agreement last reviewed with patient:  03-21-23 NCCSRS reviewed this encounter (include red flags): Yes Meds were refilled. She will come by the lab this week for a UDS.  Gershon Crane, MD  Discussed the following assessment and plan:  Chronic narcotic use - Plan: DRUG MONITOR, PANEL 1, W/CONF, URINE     I discussed the assessment and treatment plan with the patient. The patient was provided an opportunity to ask questions and all were answered. The patient agreed with the plan and demonstrated an understanding of the instructions.   The patient was advised to call back or seek an in-person evaluation if the symptoms worsen or if the condition fails to improve as anticipated.      Review of Systems     Objective:   Physical Exam        Assessment & Plan:

## 2023-03-28 ENCOUNTER — Other Ambulatory Visit: Payer: BC Managed Care – PPO

## 2023-03-28 DIAGNOSIS — F1190 Opioid use, unspecified, uncomplicated: Secondary | ICD-10-CM

## 2023-03-31 ENCOUNTER — Other Ambulatory Visit: Payer: Self-pay | Admitting: Family Medicine

## 2023-03-31 LAB — DRUG MONITOR, PANEL 1, W/CONF, URINE
Amphetamine: NEGATIVE ng/mL (ref <250)
Amphetamines: NEGATIVE ng/mL (ref <500)
Barbiturates: NEGATIVE ng/mL (ref <300)
Benzodiazepines: NEGATIVE ng/mL (ref <100)
Cocaine Metabolite: NEGATIVE ng/mL (ref <150)
Codeine: NEGATIVE ng/mL (ref <50)
Creatinine: 67.9 mg/dL (ref >=20.0)
Hydrocodone: 203 ng/mL — ABNORMAL HIGH (ref <50)
Hydromorphone: 450 ng/mL — ABNORMAL HIGH (ref <50)
Marijuana Metabolite: NEGATIVE ng/mL (ref <20)
Methadone Metabolite: NEGATIVE ng/mL (ref <100)
Methamphetamine: NEGATIVE ng/mL (ref <250)
Morphine: NEGATIVE ng/mL (ref <50)
Norhydrocodone: 368 ng/mL — ABNORMAL HIGH (ref <50)
Opiates: POSITIVE ng/mL — AB (ref <100)
Oxidant: NEGATIVE ug/mL (ref <200)
Oxycodone: NEGATIVE ng/mL (ref <100)
Phencyclidine: NEGATIVE ng/mL (ref <25)
pH: 6.5 (ref 4.5–9.0)

## 2023-03-31 LAB — DM TEMPLATE

## 2023-03-31 NOTE — Telephone Encounter (Addendum)
Pt called to say she is completely out of her Rx for Zolpidem 10mg   Please send refill to:  [id number] [place], [place] - [address] AT [facility] [address] Phone: [phone]  Fax: [phone]

## 2023-03-31 NOTE — Telephone Encounter (Signed)
Pt LOV was on 03/21/23 Last refill was done on 10/07/22 Please advise

## 2023-04-10 ENCOUNTER — Other Ambulatory Visit: Payer: Self-pay | Admitting: Family Medicine

## 2023-04-10 DIAGNOSIS — Z1231 Encounter for screening mammogram for malignant neoplasm of breast: Secondary | ICD-10-CM

## 2023-05-16 ENCOUNTER — Ambulatory Visit
Admission: RE | Admit: 2023-05-16 | Discharge: 2023-05-16 | Disposition: A | Discharge status: Home / Self Care | Payer: BC Managed Care – PPO | Source: Ambulatory Visit | Attending: Family Medicine | Admitting: Family Medicine

## 2023-05-16 DIAGNOSIS — Z1231 Encounter for screening mammogram for malignant neoplasm of breast: Secondary | ICD-10-CM

## 2023-05-23 ENCOUNTER — Other Ambulatory Visit (INDEPENDENT_AMBULATORY_CARE_PROVIDER_SITE_OTHER): Payer: BC Managed Care – PPO

## 2023-05-23 DIAGNOSIS — R7989 Other specified abnormal findings of blood chemistry: Secondary | ICD-10-CM | POA: Diagnosis not present

## 2023-05-23 LAB — BASIC METABOLIC PANEL
BUN: 19 mg/dL (ref 6–23)
CO2: 25 mEq/L (ref 19–32)
Calcium: 8.7 mg/dL (ref 8.4–10.5)
Chloride: 106 mEq/L (ref 96–112)
Creatinine, Ser: 1.28 mg/dL — ABNORMAL HIGH (ref 0.40–1.20)
GFR: 46.84 mL/min — ABNORMAL LOW (ref >60.00)
Glucose, Bld: 77 mg/dL (ref 70–99)
Potassium: 4 mEq/L (ref 3.5–5.1)
Sodium: 139 mEq/L (ref 135–145)

## 2023-06-13 DIAGNOSIS — F250 Schizoaffective disorder, bipolar type: Secondary | ICD-10-CM | POA: Diagnosis not present

## 2023-06-26 ENCOUNTER — Encounter: Payer: Self-pay | Admitting: Family Medicine

## 2023-06-26 ENCOUNTER — Telehealth: Payer: BC Managed Care – PPO | Admitting: Family Medicine

## 2023-06-26 DIAGNOSIS — F1190 Opioid use, unspecified, uncomplicated: Secondary | ICD-10-CM | POA: Diagnosis not present

## 2023-06-26 DIAGNOSIS — G43019 Migraine without aura, intractable, without status migrainosus: Secondary | ICD-10-CM | POA: Diagnosis not present

## 2023-06-26 DIAGNOSIS — N644 Mastodynia: Secondary | ICD-10-CM | POA: Diagnosis not present

## 2023-06-26 DIAGNOSIS — N6452 Nipple discharge: Secondary | ICD-10-CM

## 2023-06-26 MED ORDER — HYDROCODONE-ACETAMINOPHEN 5-325 MG PO TABS: 1.0000 | ORAL_TABLET | Freq: Four times a day (QID) | ORAL | 0 refills | Status: DC | PRN

## 2023-06-26 NOTE — Progress Notes (Signed)
Subjective:    Patient ID: [name], female    DOB: 11-19-1966, 57 y.o.   MRN: [id number]  HPI Virtual Visit via Video Note  I connected with the patient on 06/26/23 at 10:45 AM EDT by a video enabled telemedicine application and verified that I am speaking with the correct person using two identifiers.  Location patient: home Location provider:work or home office Persons participating in the virtual visit: patient, provider  I discussed the limitations of evaluation and management by telemedicine and the availability of in person appointments. The patient expressed understanding and agreed to proceed.   HPI: Here for pain management. Her headaches have been a little more frequent the past few months. The Norco gives her relief, but she often needs to take it more than 3 times a day.    ROS: See pertinent positives and negatives per HPI.  Past Medical History:  Diagnosis Date   Anxiety    Arthritis    Chronic constipation 01/10/2014   Depression    sees Deatra Robinson NP    Drug overdose 10/18/2014   Epilepsy (HCC)    seizures, sees Dr. Anne Hahn   Gallstones    [id number](308.6)    migraine   Headache, common migraine, intractable 09/09/2015   Hemorrhoids    Hypertension    Insomnia    Obsessive compulsive disorder    Suicidal ideation    Suicide and self-inflicted injury (HCC) 10/18/2014    Past Surgical History:  Procedure Laterality Date   ABDOMINAL HYSTERECTOMY  2001   APPENDECTOMY Right    with ovarian cyst removal   BREAST EXCISIONAL BIOPSY     left 2008   BREAST LUMPECTOMY Left 01/2007   Dr. Abbey Chatters   CESAREAN SECTION     CHOLECYSTECTOMY     COLONOSCOPY  10/07/2019   per Dr. Leone Payor, no polyps, repeat in 10 yrs    HEMORRHOID BANDING  2015   TONSILLECTOMY      Family History  Problem Relation Age of Onset   Cirrhosis Mother    Stomach cancer Mother    Heart disease Mother    Heart disease Father    Hypertension Father    Thyroid cancer  Brother    HIV Brother    Pancreatic cancer Maternal Uncle    Colon cancer Neg Hx    Esophageal cancer Neg Hx    Rectal cancer Neg Hx      Current Outpatient Medications:    estrogens, conjugated, (PREMARIN) 0.625 MG tablet, TAKE 1 TABLET BY MOUTH EVERY DAY FOR 21 DAYS. DO NOT TAKE FOR 7 DAYS, Disp: 90 tablet, Rfl: 3   HYDROcodone-acetaminophen (NORCO) 5-325 MG tablet, Take 1 tablet by mouth every 8 (eight) hours as needed for moderate pain., Disp: 90 tablet, Rfl: 0   lurasidone (LATUDA) 80 MG TABS tablet, Take 80 mg by mouth at bedtime., Disp: , Rfl:    methocarbamol (ROBAXIN) 500 MG tablet, Take 500 mg by mouth daily., Disp: , Rfl:    ondansetron (ZOFRAN-ODT) 8 MG disintegrating tablet, Take 1 tablet (8 mg total) by mouth every 8 (eight) hours as needed for nausea or vomiting., Disp: 20 tablet, Rfl: 11   pregabalin (LYRICA) 150 MG capsule, TAKE 1 CAPSULE(150 MG) BY MOUTH TWICE DAILY, Disp: 180 capsule, Rfl: 1   trazodone (DESYREL) 300 MG tablet, Take 1 tablet (300 mg total) by mouth at bedtime., Disp: 90 tablet, Rfl: 3   zolpidem (AMBIEN) 10 MG tablet, TAKE 1 TABLET(10 MG) BY MOUTH AT BEDTIME  AS NEEDED, Disp: 30 tablet, Rfl: 5   zonisamide (ZONEGRAN) 100 MG capsule, TAKE 2 CAPSULE BY MOUTH EVERY MORNING AND 3 CAPSULE EVERY EVENING., Disp: 450 capsule, Rfl: 11  EXAM:  VITALS per patient if applicable:  GENERAL: alert, oriented, appears well and in no acute distress  HEENT: atraumatic, conjunttiva clear, no obvious abnormalities on inspection of external nose and ears  NECK: normal movements of the head and neck  LUNGS: on inspection no signs of respiratory distress, breathing rate appears normal, no obvious gross SOB, gasping or wheezing  CV: no obvious cyanosis  MS: moves all visible extremities without noticeable abnormality  PSYCH/NEURO: pleasant and cooperative, no obvious depression or anxiety, speech and thought processing grossly intact  ASSESSMENT AND PLAN: Pain  management. Indication for chronic opioid: headaches  Medication and dose: Norco 5-325 # pills per month: 120 Last UDS date: 03-28-23 Opioid Treatment Agreement signed (Y/N): 02-10-22 Opioid Treatment Agreement last reviewed with patient:  06-26-23 N[facility] reviewed this encounter (include red flags): Yes We refilled the Norco 5-325, but we will increase the number to 120 per month. Gershon Crane, MD  Discussed the following assessment and plan:  No diagnosis found.     I discussed the assessment and treatment plan with the patient. The patient was provided an opportunity to ask questions and all were answered. The patient agreed with the plan and demonstrated an understanding of the instructions.   The patient was advised to call back or seek an in-person evaluation if the symptoms worsen or if the condition fails to improve as anticipated.      Review of Systems     Objective:   Physical Exam        Assessment & Plan:

## 2023-07-19 ENCOUNTER — Telehealth (INDEPENDENT_AMBULATORY_CARE_PROVIDER_SITE_OTHER): Payer: BC Managed Care – PPO | Admitting: Adult Health

## 2023-07-19 ENCOUNTER — Encounter: Payer: Self-pay | Admitting: Adult Health

## 2023-07-19 DIAGNOSIS — G40909 Epilepsy, unspecified, not intractable, without status epilepticus: Secondary | ICD-10-CM

## 2023-07-19 DIAGNOSIS — R569 Unspecified convulsions: Secondary | ICD-10-CM

## 2023-07-19 DIAGNOSIS — G43709 Chronic migraine without aura, not intractable, without status migrainosus: Secondary | ICD-10-CM

## 2023-07-19 MED ORDER — ZONISAMIDE 100 MG PO CAPS: 200.0000 mg | ORAL_CAPSULE | Freq: Two times a day (BID) | ORAL | 11 refills | Status: DC

## 2023-07-19 MED ORDER — ONDANSETRON 8 MG PO TBDP: 8.0000 mg | ORAL_TABLET | Freq: Three times a day (TID) | ORAL | 11 refills | Status: DC | PRN

## 2023-07-19 NOTE — Progress Notes (Signed)
[name] [address] [place]. [place] [address] [phone]       OFFICE FOLLOW UP NOTE  [name] Date of Birth:  04-01-1966 Medical Record Number:  [id number]   Current neurologist: Dr. Delena Bali Reason for visit: Migraine headaches  Virtual Visit via Video Note  I connected with [name] on 07/19/23 at 10:45 AM EDT by a video enabled telemedicine application and verified that I am speaking with the correct person using two identifiers.  Location: Patient: at home Provider: in office   I discussed the limitations of evaluation and management by telemedicine and the availability of in person appointments. The patient expressed understanding and agreed to proceed.   SUBJECTIVE:   [name]  Prior visit: 10/18/2022  Brief HPI:   [name] is a 57 y.o. female with longstanding history of intractable migraine headaches and seizure disorder.  Started Botox in 2016, last injection 02/2022, stopped due to cost.  Last seizure 2019 on zonisamide and pregabalin.   At prior visit, migraines without worsening since stopping Botox, reported continued 8-12 migraine days lasting 1-2 days per month.  Use of hydrocodone as needed managed by PCP. discussed Vyepti but patient wished to further consider.  Continued on zonisamide 200/300 and pregabalin 150mg  BID for seizure prophylaxis.   Interval history:  Reports doing well overall. Still having about 8-12 migraines per month, can last 1-2 days. Use of hydrocodone as needed with benefit, managed by PCP, and occasional use of Zofran. Not currently on prophylactic therapy  Denies any seizure activity, remains on zonisamide and pregabalin. She questions ongoing need of these medications as she has been seizure-free for 5 years. Does c/o short term memory difficulties. Reports onset of seizure in her teenager years. Does report at some point came off medications but had additional seizures  therefore ASMs restarted.      ROS:   14 system review of systems performed and negative with exception of those listed in HPI  PMH:  Past Medical History:  Diagnosis Date   Anxiety    Arthritis    Chronic constipation 01/10/2014   Depression    sees Deatra Robinson NP    Drug overdose 10/18/2014   Epilepsy (HCC)    seizures, sees Dr. Anne Hahn   Gallstones    [id number](932.6)    migraine   Headache, common migraine, intractable 09/09/2015   Hemorrhoids    Hypertension    Insomnia    Obsessive compulsive disorder    Suicidal ideation    Suicide and self-inflicted injury (HCC) 10/18/2014    PSH:  Past Surgical History:  Procedure Laterality Date   ABDOMINAL HYSTERECTOMY  2001   APPENDECTOMY Right    with ovarian cyst removal   BREAST EXCISIONAL BIOPSY     left 2008   BREAST LUMPECTOMY Left 01/2007   Dr. Abbey Chatters   CESAREAN SECTION     CHOLECYSTECTOMY     COLONOSCOPY  10/07/2019   per Dr. Leone Payor, no polyps, repeat in 10 yrs    HEMORRHOID BANDING  2015   TONSILLECTOMY      Social History:  Social History   Socioeconomic History   Marital status: Married    Spouse name: Not on file   Number of children: 2   Years of education: 13   Highest education level: Not on file  Occupational History   Occupation: homemaker    Employer: UNEMPLOYED  Tobacco Use   Smoking status: Never   Smokeless tobacco: Never  Vaping Use   Vaping status: Never Used  Substance and Sexual Activity   Alcohol use: Yes    Alcohol/week: 0.0 standard drinks of alcohol    Comment: rare   Drug use: No   Sexual activity: Not on file  Other Topics Concern   Not on file  Social History Narrative   Married 2 sons homemaker   4-5 caffeinated beverages daily (Pepsi)   Patient is right handed.   Social Determinants of Health   Financial Resource Strain: Not on file  Food Insecurity: Not on file  Transportation Needs: Not on file  Physical Activity: Not on file  Stress: Not on file   Social Connections: Not on file  Intimate Partner Violence: Not on file    Family History:  Family History  Problem Relation Age of Onset   Cirrhosis Mother    Stomach cancer Mother    Heart disease Mother    Heart disease Father    Hypertension Father    Thyroid cancer Brother    HIV Brother    Pancreatic cancer Maternal Uncle    Colon cancer Neg Hx    Esophageal cancer Neg Hx    Rectal cancer Neg Hx     Medications:   [name]  Medication Sig Dispense Refill   estrogens, conjugated, (PREMARIN) 0.625 MG tablet TAKE 1 TABLET BY MOUTH EVERY DAY FOR 21 DAYS. DO NOT TAKE FOR 7 DAYS 90 tablet 3   HYDROcodone-acetaminophen (NORCO) 5-325 MG tablet Take 1 tablet by mouth every 6 (six) hours as needed for moderate pain. 120 tablet 0   HYDROcodone-acetaminophen (NORCO) 5-325 MG tablet Take 1 tablet by mouth every 6 (six) hours as needed for moderate pain. 120 tablet 0   HYDROcodone-acetaminophen (NORCO) 5-325 MG tablet Take 1 tablet by mouth every 6 (six) hours as needed for moderate pain. 120 tablet 0   lurasidone (LATUDA) 80 MG TABS tablet Take 80 mg by mouth at bedtime.     methocarbamol (ROBAXIN) 500 MG tablet Take 500 mg by mouth daily.     ondansetron (ZOFRAN-ODT) 8 MG disintegrating tablet Take 1 tablet (8 mg total) by mouth every 8 (eight) hours as needed for nausea or vomiting. 20 tablet 11   pregabalin (LYRICA) 150 MG capsule TAKE 1 CAPSULE(150 MG) BY MOUTH TWICE DAILY 180 capsule 1   trazodone (DESYREL) 300 MG tablet Take 1 tablet (300 mg total) by mouth at bedtime. 90 tablet 3   zolpidem (AMBIEN) 10 MG tablet TAKE 1 TABLET(10 MG) BY MOUTH AT BEDTIME AS NEEDED 30 tablet 5   zonisamide (ZONEGRAN) 100 MG capsule TAKE 2 CAPSULE BY MOUTH EVERY MORNING AND 3 CAPSULE EVERY EVENING. 450 capsule 11   No current facility-administered medications on file prior to visit.    Allergies:   Allergies  Allergen Reactions   Vimpat [Lacosamide]      Increased depression      OBJECTIVE: N/A d/t visit type      ASSESSMENT/PLAN: [name] is a 57 y.o. year old female   1.  Chronic migraine headaches  -Continued 8-12 migraines per month lasting 1 to 2 days, d/c'd botox in 2023 due to cost.   -Again discussed starting Vyepti but declines interest  -Prefers to continue hydrocodone PRN (managed by PCP) and zofran as needed  -Advised to call if interested in pursuing prophylactic therapy in the future    2.  Seizure disorder  -Present since teenager  -Last seizure activity  2019  -patient requests either reducing dosage or possibly stopping ASM. She does report previously coming off ASM but had recurrent seizure. I would be more in favor of slightly reducing dosage at this time as she is at a high risk of additional seizures if ASMs discontinued again. She does not currently drive  -will decrease zonisamide from 200/300 to 200mg  BID  -Continue pregabalin 150mg  BID  -Advised to call with any seizure activity or worsening migraine headaches as zonisamide can help with migraine management     Follow up in 6 months or call earlier if needed     CC:  PCP: Nelwyn Salisbury, MD    I spent 25 minutes of face-to-face and non-face-to-face time with patient.  This included previsit chart review, lab review, study review, order entry, electronic health record documentation, patient education regarding above diagnoses and treatment plan and answered all the questions to patient's satisfaction   Ihor Austin, [facility]  [facility] [address] [place], [place] [address]  Phone [phone] Fax [phone] Note: This document was prepared with digital dictation and possible smart phrase technology. Any transcriptional errors that result from this process are unintentional.

## 2023-07-20 ENCOUNTER — Telehealth: Payer: Self-pay | Admitting: Adult Health

## 2023-07-20 MED ORDER — ZONISAMIDE 100 MG PO CAPS: 200.0000 mg | ORAL_CAPSULE | Freq: Two times a day (BID) | ORAL | 1 refills | Status: DC

## 2023-07-20 NOTE — Telephone Encounter (Signed)
Pt states re: her zonisamide (ZONEGRAN) 100 MG capsule  it was called in as a 30 day, pt is asking this be called in as a 90 day to the same [facility] [phone] (pt did inform that she picked up the 30 day for the month of Aug)

## 2023-07-20 NOTE — Addendum Note (Signed)
Addended by: Judi Cong on: 07/20/2023 10:45 AM   Modules accepted: Orders

## 2023-07-20 NOTE — Telephone Encounter (Signed)
3 mth suppy sent to the pharmacy for the patient

## 2023-08-02 ENCOUNTER — Ambulatory Visit
Admission: RE | Admit: 2023-08-02 | Discharge: 2023-08-02 | Disposition: A | Discharge status: Home / Self Care | Payer: BC Managed Care – PPO | Source: Ambulatory Visit | Attending: Family Medicine | Admitting: Family Medicine

## 2023-08-02 DIAGNOSIS — N6042 Mammary duct ectasia of left breast: Secondary | ICD-10-CM | POA: Diagnosis not present

## 2023-08-02 DIAGNOSIS — N6452 Nipple discharge: Secondary | ICD-10-CM | POA: Diagnosis not present

## 2023-08-02 DIAGNOSIS — N644 Mastodynia: Secondary | ICD-10-CM

## 2023-09-13 ENCOUNTER — Other Ambulatory Visit: Payer: Self-pay | Admitting: Neurology

## 2023-09-13 DIAGNOSIS — F250 Schizoaffective disorder, bipolar type: Secondary | ICD-10-CM | POA: Diagnosis not present

## 2023-09-13 NOTE — Telephone Encounter (Signed)
  Dispensed Days Supply Quantity Provider Pharmacy   PREGABALIN 150MG  CAPSULES 06/14/2023 90 180 each Windell Norfolk, MD [facility] #...  PREGABALIN 150MG  CAPSULES 03/14/2023 90 180 each Windell Norfolk, MD [facility] #...  PREGABALIN 150MG  CAPSULES 12/15/2022 90 180 each Ocie Doyne, MD [facility] DR[facility]ORE #...  PREGABALIN 150MG  CAPSULES 09/19/2022 90 180 each Ocie Doyne, MD [facility] DR[facility]  Last visit 07/19/23 Next visit 02/05/24

## 2023-09-21 ENCOUNTER — Other Ambulatory Visit: Payer: Self-pay | Admitting: Family Medicine

## 2023-09-25 NOTE — Telephone Encounter (Signed)
Pt LOV was 06/26/23 Last refill was done on 03/31/23 Please advise

## 2023-09-27 ENCOUNTER — Telehealth: Payer: BC Managed Care – PPO | Admitting: Family Medicine

## 2023-09-27 ENCOUNTER — Encounter: Payer: Self-pay | Admitting: Family Medicine

## 2023-09-27 DIAGNOSIS — G43711 Chronic migraine without aura, intractable, with status migrainosus: Secondary | ICD-10-CM

## 2023-09-27 DIAGNOSIS — F1190 Opioid use, unspecified, uncomplicated: Secondary | ICD-10-CM | POA: Diagnosis not present

## 2023-09-27 MED ORDER — HYDROCODONE-ACETAMINOPHEN 5-325 MG PO TABS: 1.0000 | ORAL_TABLET | Freq: Four times a day (QID) | ORAL | 0 refills | Status: DC | PRN

## 2023-09-27 NOTE — Progress Notes (Signed)
Subjective:    Patient ID: [name], female    DOB: [birth year], 57 y.o.   MRN: [id number]  HPI Virtual Visit via Video Note  I connected with the patient on 09/27/23 at 11:00 AM EDT by a video enabled telemedicine application and verified that I am speaking with the correct person using two identifiers.  Location patient: home Location provider:work or home office Persons participating in the virtual visit: patient, provider  I discussed the limitations of evaluation and management by telemedicine and the availability of in person appointments. The patient expressed understanding and agreed to proceed.   HPI: Here for pain management. She is doing well.   ROS: See pertinent positives and negatives per HPI.  Past Medical History:  Diagnosis Date   Anxiety    Arthritis    Chronic constipation 01/10/2014   Depression    sees Deatra Robinson NP    Drug overdose 10/18/2014   Epilepsy (HCC)    seizures, sees Dr. Anne Hahn   Gallstones    [id number](478.2)    migraine   Headache, common migraine, intractable 09/09/2015   Hemorrhoids    Hypertension    Insomnia    Obsessive compulsive disorder    Suicidal ideation    Suicide and self-inflicted injury (HCC) 10/18/2014    Past Surgical History:  Procedure Laterality Date   ABDOMINAL HYSTERECTOMY  2001   APPENDECTOMY Right    with ovarian cyst removal   BREAST EXCISIONAL BIOPSY     left 2008   CESAREAN SECTION     CHOLECYSTECTOMY     COLONOSCOPY  10/07/2019   per Dr. Leone Payor, no polyps, repeat in 10 yrs    HEMORRHOID BANDING  2015   TONSILLECTOMY      Family History  Problem Relation Age of Onset   Cirrhosis Mother    Stomach cancer Mother    Heart disease Mother    Heart disease Father    Hypertension Father    Thyroid cancer Brother    HIV Brother    Pancreatic cancer Maternal Uncle    Colon cancer Neg Hx    Esophageal cancer Neg Hx    Rectal cancer Neg Hx      Current Outpatient Medications:     estrogens, conjugated, (PREMARIN) 0.625 MG tablet, TAKE 1 TABLET BY MOUTH EVERY DAY FOR 21 DAYS. DO NOT TAKE FOR 7 DAYS, Disp: 90 tablet, Rfl: 3   HYDROcodone-acetaminophen (NORCO) 5-325 MG tablet, Take 1 tablet by mouth every 6 (six) hours as needed for moderate pain., Disp: 120 tablet, Rfl: 0   HYDROcodone-acetaminophen (NORCO) 5-325 MG tablet, Take 1 tablet by mouth every 6 (six) hours as needed for moderate pain., Disp: 120 tablet, Rfl: 0   HYDROcodone-acetaminophen (NORCO) 5-325 MG tablet, Take 1 tablet by mouth every 6 (six) hours as needed for moderate pain., Disp: 120 tablet, Rfl: 0   lurasidone (LATUDA) 80 MG TABS tablet, Take 80 mg by mouth at bedtime., Disp: , Rfl:    methocarbamol (ROBAXIN) 500 MG tablet, Take 500 mg by mouth daily., Disp: , Rfl:    ondansetron (ZOFRAN-ODT) 8 MG disintegrating tablet, Take 1 tablet (8 mg total) by mouth every 8 (eight) hours as needed for nausea or vomiting., Disp: 20 tablet, Rfl: 11   pregabalin (LYRICA) 150 MG capsule, Take 1 capsule (150 mg total) by mouth 2 (two) times daily. TAKE 1 CAPSULE(150 MG) BY MOUTH TWICE DAILY, Disp: 180 capsule, Rfl: 1   trazodone (DESYREL) 300 MG tablet, Take 1 tablet (  300 mg total) by mouth at bedtime., Disp: 90 tablet, Rfl: 3   zolpidem (AMBIEN) 10 MG tablet, TAKE 1 TABLET(10 MG) BY MOUTH AT BEDTIME AS NEEDED, Disp: 30 tablet, Rfl: 5   zonisamide (ZONEGRAN) 100 MG capsule, Take 2 capsules (200 mg total) by mouth 2 (two) times daily., Disp: 360 capsule, Rfl: 1  EXAM:  VITALS per patient if applicable:  GENERAL: alert, oriented, appears well and in no acute distress  HEENT: atraumatic, conjunttiva clear, no obvious abnormalities on inspection of external nose and ears  NECK: normal movements of the head and neck  LUNGS: on inspection no signs of respiratory distress, breathing rate appears normal, no obvious gross SOB, gasping or wheezing  CV: no obvious cyanosis  MS: moves all visible extremities without  noticeable abnormality  PSYCH/NEURO: pleasant and cooperative, no obvious depression or anxiety, speech and thought processing grossly intact  ASSESSMENT AND PLAN: Pain management. Indication for chronic opioid: headaches Medication and dose: Noroc 5-325 # pills per month: 120 Last UDS date: 03-28-23 Opioid Treatment Agreement signed (Y/N): 02-10-22 Opioid Treatment Agreement last reviewed with patient:   N[facility]SRS reviewed this encounter (include red flags): Yes Meds were refilled.  Gershon Crane, MD  Discussed the following assessment and plan:  No diagnosis found.     I discussed the assessment and treatment plan with the patient. The patient was provided an opportunity to ask questions and all were answered. The patient agreed with the plan and demonstrated an understanding of the instructions.   The patient was advised to call back or seek an in-person evaluation if the symptoms worsen or if the condition fails to improve as anticipated.      Review of Systems     Objective:   Physical Exam        Assessment & Plan:

## 2023-12-14 DIAGNOSIS — F250 Schizoaffective disorder, bipolar type: Secondary | ICD-10-CM | POA: Diagnosis not present

## 2024-01-03 ENCOUNTER — Encounter: Payer: Self-pay | Admitting: Family Medicine

## 2024-01-03 ENCOUNTER — Telehealth: Payer: BC Managed Care – PPO | Admitting: Family Medicine

## 2024-01-03 DIAGNOSIS — G43019 Migraine without aura, intractable, without status migrainosus: Secondary | ICD-10-CM

## 2024-01-03 DIAGNOSIS — F1190 Opioid use, unspecified, uncomplicated: Secondary | ICD-10-CM

## 2024-01-03 MED ORDER — HYDROCODONE-ACETAMINOPHEN 5-325 MG PO TABS: 1.0000 | ORAL_TABLET | Freq: Four times a day (QID) | ORAL | 0 refills | Status: DC | PRN

## 2024-01-03 NOTE — Progress Notes (Signed)
Subjective:    Patient ID: [name], female    DOB: 1966-07-14, 58 y.o.   MRN: [id number]  HPI Virtual Visit via Video Note  I connected with the patient on 01/03/24 at  1:45 PM EST by a video enabled telemedicine application and verified that I am speaking with the correct person using two identifiers.  Location patient: home Location provider:work or home office Persons participating in the virtual visit: patient, provider  I discussed the limitations of evaluation and management by telemedicine and the availability of in person appointments. The patient expressed understanding and agreed to proceed.   HPI: Here for pain management. She is doing well.    ROS: See pertinent positives and negatives per HPI.  Past Medical History:  Diagnosis Date   Anxiety    Arthritis    Chronic constipation 01/10/2014   Depression    sees Deatra Robinson NP    Drug overdose 10/18/2014   Epilepsy (HCC)    seizures, sees Dr. Anne Hahn   Gallstones    WUJWJXBJ(478.2)    migraine   Headache, common migraine, intractable 09/09/2015   Hemorrhoids    Hypertension    Insomnia    Obsessive compulsive disorder    Suicidal ideation    Suicide and self-inflicted injury (HCC) 10/18/2014    Past Surgical History:  Procedure Laterality Date   ABDOMINAL HYSTERECTOMY  2001   APPENDECTOMY Right    with ovarian cyst removal   BREAST EXCISIONAL BIOPSY     left 2008   CESAREAN SECTION     CHOLECYSTECTOMY     COLONOSCOPY  10/07/2019   per Dr. Leone Payor, no polyps, repeat in 10 yrs    HEMORRHOID BANDING  2015   TONSILLECTOMY      Family History  Problem Relation Age of Onset   Cirrhosis Mother    Stomach cancer Mother    Heart disease Mother    Heart disease Father    Hypertension Father    Thyroid cancer Brother    HIV Brother    Pancreatic cancer Maternal Uncle    Colon cancer Neg Hx    Esophageal cancer Neg Hx    Rectal cancer Neg Hx      Current Outpatient Medications:     estrogens, conjugated, (PREMARIN) 0.625 MG tablet, TAKE 1 TABLET BY MOUTH EVERY DAY FOR 21 DAYS. DO NOT TAKE FOR 7 DAYS, Disp: 90 tablet, Rfl: 3   HYDROcodone-acetaminophen (NORCO) 5-325 MG tablet, Take 1 tablet by mouth every 6 (six) hours as needed for moderate pain (pain score 4-6)., Disp: 120 tablet, Rfl: 0   HYDROcodone-acetaminophen (NORCO) 5-325 MG tablet, Take 1 tablet by mouth every 6 (six) hours as needed for moderate pain (pain score 4-6)., Disp: 120 tablet, Rfl: 0   HYDROcodone-acetaminophen (NORCO) 5-325 MG tablet, Take 1 tablet by mouth every 6 (six) hours as needed for moderate pain (pain score 4-6)., Disp: 120 tablet, Rfl: 0   lurasidone (LATUDA) 80 MG TABS tablet, Take 80 mg by mouth at bedtime., Disp: , Rfl:    methocarbamol (ROBAXIN) 500 MG tablet, Take 500 mg by mouth daily., Disp: , Rfl:    ondansetron (ZOFRAN-ODT) 8 MG disintegrating tablet, Take 1 tablet (8 mg total) by mouth every 8 (eight) hours as needed for nausea or vomiting., Disp: 20 tablet, Rfl: 11   pregabalin (LYRICA) 150 MG capsule, Take 1 capsule (150 mg total) by mouth 2 (two) times daily. TAKE 1 CAPSULE(150 MG) BY MOUTH TWICE DAILY, Disp: 180 capsule, Rfl:  1   trazodone (DESYREL) 300 MG tablet, Take 1 tablet (300 mg total) by mouth at bedtime., Disp: 90 tablet, Rfl: 3   zolpidem (AMBIEN) 10 MG tablet, TAKE 1 TABLET(10 MG) BY MOUTH AT BEDTIME AS NEEDED, Disp: 30 tablet, Rfl: 5   zonisamide (ZONEGRAN) 100 MG capsule, Take 2 capsules (200 mg total) by mouth 2 (two) times daily., Disp: 360 capsule, Rfl: 1  EXAM:  VITALS per patient if applicable:  GENERAL: alert, oriented, appears well and in no acute distress  HEENT: atraumatic, conjunttiva clear, no obvious abnormalities on inspection of external nose and ears  NECK: normal movements of the head and neck  LUNGS: on inspection no signs of respiratory distress, breathing rate appears normal, no obvious gross SOB, gasping or wheezing  CV: no obvious  cyanosis  MS: moves all visible extremities without noticeable abnormality  PSYCH/NEURO: pleasant and cooperative, no obvious depression or anxiety, speech and thought processing grossly intact  ASSESSMENT AND PLAN: Pain management. Indication for chronic opioid: headaches Medication and dose: Norco 5-325 # pills per month: 120 Last UDS date: 03-28-23 Opioid Treatment Agreement signed (Y/N): 02-10-22 Opioid Treatment Agreement last reviewed with patient:  01-03-24 N[facility] reviewed this encounter (include red flags): Yes Meds were refilled.  Gershon Crane, MD  Discussed the following assessment and plan:  No diagnosis found.     I discussed the assessment and treatment plan with the patient. The patient was provided an opportunity to ask questions and all were answered. The patient agreed with the plan and demonstrated an understanding of the instructions.   The patient was advised to call back or seek an in-person evaluation if the symptoms worsen or if the condition fails to improve as anticipated.      Review of Systems     Objective:   Physical Exam        Assessment & Plan:

## 2024-02-05 ENCOUNTER — Encounter: Payer: Self-pay | Admitting: Adult Health

## 2024-02-05 ENCOUNTER — Telehealth (INDEPENDENT_AMBULATORY_CARE_PROVIDER_SITE_OTHER): Payer: BC Managed Care – PPO | Admitting: Adult Health

## 2024-02-05 DIAGNOSIS — G43709 Chronic migraine without aura, not intractable, without status migrainosus: Secondary | ICD-10-CM | POA: Diagnosis not present

## 2024-02-05 DIAGNOSIS — G40909 Epilepsy, unspecified, not intractable, without status epilepticus: Secondary | ICD-10-CM | POA: Diagnosis not present

## 2024-02-05 DIAGNOSIS — R569 Unspecified convulsions: Secondary | ICD-10-CM

## 2024-02-05 MED ORDER — ZONISAMIDE 100 MG PO CAPS: ORAL_CAPSULE | ORAL | 3 refills | Status: AC

## 2024-02-05 MED ORDER — ONDANSETRON 8 MG PO TBDP: 8.0000 mg | ORAL_TABLET | Freq: Three times a day (TID) | ORAL | 11 refills | Status: AC | PRN

## 2024-02-05 NOTE — Progress Notes (Signed)
 [name] [address] [place]. [place] [address] [phone]       OFFICE FOLLOW UP NOTE  Ms. JAHLIA OMURA Date of Birth:  12/30/1965 Medical Record Number:  [id number]   Current neurologist: Dr. Delena Bali Reason for visit: Migraine headaches  Virtual Visit via Video Note  I connected with Blondell Reveal on 02/05/24 at 11:15 AM EST by a video enabled telemedicine application and verified that I am speaking with the correct person using two identifiers.  Location: Patient: at home Provider: in office   I discussed the limitations of evaluation and management by telemedicine and the availability of in person appointments. The patient expressed understanding and agreed to proceed.   SUBJECTIVE:   [name]  Prior visit: 07/19/2023  Brief HPI:   SCARLETTROSE COSTILOW is a 58 y.o. female with longstanding history of intractable migraine headaches and seizure disorder.  Started Botox in 2016, last injection 02/2022, stopped due to cost.  Last seizure 2019 on zonisamide and pregabalin.   At prior visit, reported 8-12 migraines per month, declined interest in preventative therapy, preferred to continue hydrocodone as needed (rx'd by PCP).  No seizure activity on zonisamide and pregabalin, lowered zonisamide dosage from 200/300 to 200mg  BID per patient request, remained on pregabalin 150 mg twice daily.    Interval history:  Returns for 94-month follow-up via MyChart video visit.   Reports overall stable.  Has been experiencing about 20 migraines per month, continues to use hydrocodone and Zofran as needed with benefit. She does feel migraines have worsened some since decreasing zonisamide dosage.  Denies any seizure activity or side effects on zonisamide and pregabalin.  She is interested in returning back to prior dose of zonisamide due to increase in migraines.     ROS:   14 system review of systems performed and negative with exception of those  listed in HPI  PMH:  Past Medical History:  Diagnosis Date   Anxiety    Arthritis    Chronic constipation 01/10/2014   Depression    sees Deatra Robinson NP    Drug overdose 10/18/2014   Epilepsy (HCC)    seizures, sees Dr. Anne Hahn   Gallstones    NUUVOZDG(644.0)    migraine   Headache, common migraine, intractable 09/09/2015   Hemorrhoids    Hypertension    Insomnia    Obsessive compulsive disorder    Suicidal ideation    Suicide and self-inflicted injury (HCC) 10/18/2014    PSH:  Past Surgical History:  Procedure Laterality Date   ABDOMINAL HYSTERECTOMY  2001   APPENDECTOMY Right    with ovarian cyst removal   BREAST EXCISIONAL BIOPSY     left 2008   CESAREAN SECTION     CHOLECYSTECTOMY     COLONOSCOPY  10/07/2019   per Dr. Leone Payor, no polyps, repeat in 10 yrs    HEMORRHOID BANDING  2015   TONSILLECTOMY      Social History:  Social History   Socioeconomic History   Marital status: Married    Spouse name: Not on file   Number of children: 2   Years of education: 13   Highest education level: Not on file  Occupational History   Occupation: homemaker    Employer: UNEMPLOYED  Tobacco Use   Smoking status: Never   Smokeless tobacco: Never  Vaping Use   Vaping status: Never Used  Substance and Sexual Activity   Alcohol use: Yes    Alcohol/week: 0.0 standard drinks of alcohol  Comment: rare   Drug use: No   Sexual activity: Not on file  Other Topics Concern   Not on file  Social History Narrative   Married 2 sons homemaker   4-5 caffeinated beverages daily (Pepsi)   Patient is right handed.   Social Drivers of Corporate investment banker Strain: Not on file  Food Insecurity: Not on file  Transportation Needs: Not on file  Physical Activity: Not on file  Stress: Not on file  Social Connections: Not on file  Intimate Partner Violence: Not on file    Family History:  Family History  Problem Relation Age of Onset   Cirrhosis Mother    Stomach  cancer Mother    Heart disease Mother    Heart disease Father    Hypertension Father    Thyroid cancer Brother    HIV Brother    Pancreatic cancer Maternal Uncle    Colon cancer Neg Hx    Esophageal cancer Neg Hx    Rectal cancer Neg Hx     Medications:   [name]  Medication Sig Dispense Refill   estrogens, conjugated, (PREMARIN) 0.625 MG tablet TAKE 1 TABLET BY MOUTH EVERY DAY FOR 21 DAYS. DO NOT TAKE FOR 7 DAYS 90 tablet 3   HYDROcodone-acetaminophen (NORCO) 5-325 MG tablet Take 1 tablet by mouth every 6 (six) hours as needed for moderate pain (pain score 4-6). 120 tablet 0   HYDROcodone-acetaminophen (NORCO) 5-325 MG tablet Take 1 tablet by mouth every 6 (six) hours as needed for moderate pain (pain score 4-6). 120 tablet 0   HYDROcodone-acetaminophen (NORCO) 5-325 MG tablet Take 1 tablet by mouth every 6 (six) hours as needed for moderate pain (pain score 4-6). 120 tablet 0   lurasidone (LATUDA) 80 MG TABS tablet Take 80 mg by mouth at bedtime.     methocarbamol (ROBAXIN) 500 MG tablet Take 500 mg by mouth daily.     ondansetron (ZOFRAN-ODT) 8 MG disintegrating tablet Take 1 tablet (8 mg total) by mouth every 8 (eight) hours as needed for nausea or vomiting. 20 tablet 11   pregabalin (LYRICA) 150 MG capsule Take 1 capsule (150 mg total) by mouth 2 (two) times daily. TAKE 1 CAPSULE(150 MG) BY MOUTH TWICE DAILY 180 capsule 1   trazodone (DESYREL) 300 MG tablet Take 1 tablet (300 mg total) by mouth at bedtime. 90 tablet 3   zolpidem (AMBIEN) 10 MG tablet TAKE 1 TABLET(10 MG) BY MOUTH AT BEDTIME AS NEEDED 30 tablet 5   zonisamide (ZONEGRAN) 100 MG capsule Take 2 capsules (200 mg total) by mouth 2 (two) times daily. 360 capsule 1   No current facility-administered medications on file prior to visit.    Allergies:   Allergies  Allergen Reactions   Vimpat [Lacosamide]     Increased depression      OBJECTIVE: N/A d/t visit  type      ASSESSMENT/PLAN: IDALYS KONECNY is a 58 y.o. year old female   1.  Chronic migraine headaches  -Currently about 20 migraines per month lasting 1 to 2 days, d/c'd botox in 2023 due to cost.   -reports worsening of migraines after adjusting zonisamide dosage at prior visit (see below)  -Declines interest in further preventative therapy  -Previously tried/failed: Aimovig, Botox, zonisamide, pregabalin, metoprolol, hydrocodone, Relpax, lamotrigine, levetiracetam, Robaxin, Remeron  -Prefers to continue hydrocodone PRN (managed by PCP) and zofran as needed  -Advised to call if interested in pursuing prophylactic  therapy in the future    2.  Seizure disorder  -Present since teenager  -Last seizure activity 2019  -Previously, patient interested in gradually coming off ASM therapy but does have a history of recurrent seizure off ASM therefore it is recommended she continue with ASM lifelong.  Zonisamide dosage was decreased from 200/300 to 200mg  twice daily and remain on pregabalin, no seizure activity but noted increased migraines with reduced dosage.  -Increase zonisamide back to 200/300  -Continue pregabalin 150mg  BID - refill not yet due  -Prior intolerance to Vimpat  -Advised to call with any seizure activity or worsening migraine headaches as zonisamide can help with migraine management     Follow up in 6 months or call earlier if needed     CC:  PCP: Nelwyn Salisbury, MD    I spent 26 minutes of face-to-face and non-face-to-face time with patient via MyChart video visit.  This included previsit chart review, lab review, study review, order entry, electronic health record documentation, patient education regarding above diagnoses and treatment plan and answered all the questions to patient's satisfaction  Ihor Austin, [facility]  [facility] [address] [place], [place] [address]  Phone [phone] Fax [phone] Note: This  document was prepared with digital dictation and possible smart phrase technology. Any transcriptional errors that result from this process are unintentional.

## 2024-02-25 ENCOUNTER — Other Ambulatory Visit: Payer: Self-pay | Admitting: Family Medicine

## 2024-03-11 ENCOUNTER — Other Ambulatory Visit: Payer: BC Managed Care – PPO

## 2024-03-11 ENCOUNTER — Other Ambulatory Visit: Payer: Self-pay

## 2024-03-11 ENCOUNTER — Ambulatory Visit: Payer: BC Managed Care – PPO | Admitting: Family Medicine

## 2024-03-11 MED ORDER — PREGABALIN 150 MG PO CAPS: 150.0000 mg | ORAL_CAPSULE | Freq: Two times a day (BID) | ORAL | 1 refills | Status: DC

## 2024-03-11 NOTE — Telephone Encounter (Signed)
 Requested Prescriptions   Pending Prescriptions Disp Refills   pregabalin (LYRICA) 150 MG capsule 180 capsule 1    Sig: Take 1 capsule (150 mg total) by mouth 2 (two) times daily. TAKE 1 CAPSULE(150 MG) BY MOUTH TWICE DAILY   Last seen 02/05/24 Next appt 08/13/24  Dispenses   Dispensed Days Supply Quantity Provider Pharmacy  PREGABALIN 150MG  CAPSULES 12/14/2023 90 180 each Ihor Austin, NP [facility] #...  PREGABALIN 150MG  CAPSULES 09/13/2023 90 180 each Ihor Austin, NP [facility]ORE #...  PREGABALIN 150MG  CAPSULES 06/14/2023 90 180 each Windell Norfolk, MD [facility] #.Marland KitchenMarland Kitchen

## 2024-03-15 ENCOUNTER — Ambulatory Visit: Admitting: Family Medicine

## 2024-03-18 ENCOUNTER — Ambulatory Visit: Admitting: Family Medicine

## 2024-03-19 ENCOUNTER — Encounter: Payer: Self-pay | Admitting: Family Medicine

## 2024-03-19 ENCOUNTER — Ambulatory Visit: Admitting: Family Medicine

## 2024-03-19 VITALS — BP 118/70 | HR 72 | Temp 98.3°F | Wt 111.6 lb

## 2024-03-19 DIAGNOSIS — F1190 Opioid use, unspecified, uncomplicated: Secondary | ICD-10-CM

## 2024-03-19 DIAGNOSIS — G43019 Migraine without aura, intractable, without status migrainosus: Secondary | ICD-10-CM | POA: Diagnosis not present

## 2024-03-19 MED ORDER — HYDROCODONE-ACETAMINOPHEN 5-325 MG PO TABS: 1.0000 | ORAL_TABLET | Freq: Four times a day (QID) | ORAL | 0 refills | Status: DC | PRN

## 2024-03-19 MED ORDER — ZOLPIDEM TARTRATE 10 MG PO TABS: 10.0000 mg | ORAL_TABLET | Freq: Every day | ORAL | 5 refills | Status: DC

## 2024-03-19 MED ORDER — TRAZODONE HCL 300 MG PO TABS: 300.0000 mg | ORAL_TABLET | Freq: Every day | ORAL | 3 refills | Status: AC

## 2024-03-19 NOTE — Addendum Note (Signed)
 Addended by: Philbert Brave on: 03/19/2024 09:55 AM   Modules accepted: Orders

## 2024-03-19 NOTE — Progress Notes (Signed)
   Subjective:    Patient ID: [name], female    DOB: 06-08-1966, 58 y.o.   MRN: [id number]  HPI Here for pain management. Her headaches are about the same. She recently saw her neurologist, Johny Nap NP.    Review of Systems  Constitutional: Negative.   Neurological:  Positive for headaches. Negative for seizures.       Objective:   Physical Exam Constitutional:      Appearance: Normal appearance.  Neurological:     Mental Status: She is alert and oriented to person, place, and time. Mental status is at baseline.           Assessment & Plan:  Pain management.  Indication for chronic opioid: headaches Medication and dose: Norco 5-325 # pills per month: 120 Last UDS date: 03-19-24 Opioid Treatment Agreement signed (Y/N): 02-10-22 Opioid Treatment Agreement last reviewed with patient:  03-19-24 NCCSRS reviewed this encounter (include red flags): Yes Meds were refilled.  Corita Diego, MD

## 2024-03-22 LAB — DRUG MONITOR, PANEL 1, W/CONF, URINE
Amphetamine: NEGATIVE ng/mL (ref <250)
Amphetamines: NEGATIVE ng/mL (ref <500)
Barbiturates: NEGATIVE ng/mL (ref <300)
Benzodiazepines: NEGATIVE ng/mL (ref <100)
Cocaine Metabolite: NEGATIVE ng/mL (ref <150)
Codeine: NEGATIVE ng/mL (ref <50)
Creatinine: >300 mg/dL (ref >=20.0)
Hydrocodone: 3330 ng/mL — ABNORMAL HIGH (ref <50)
Hydromorphone: 989 ng/mL — ABNORMAL HIGH (ref <50)
Marijuana Metabolite: NEGATIVE ng/mL (ref <20)
Methadone Metabolite: NEGATIVE ng/mL (ref <100)
Methamphetamine: NEGATIVE ng/mL (ref <250)
Morphine: NEGATIVE ng/mL (ref <50)
Norhydrocodone: 3619 ng/mL — ABNORMAL HIGH (ref <50)
Opiates: POSITIVE ng/mL — AB (ref <100)
Oxidant: NEGATIVE ug/mL (ref <200)
Oxycodone: NEGATIVE ng/mL (ref <100)
Phencyclidine: NEGATIVE ng/mL (ref <25)
pH: 5.6 (ref 4.5–9.0)

## 2024-03-22 LAB — DM TEMPLATE

## 2024-04-04 ENCOUNTER — Encounter: Admitting: Family Medicine

## 2024-05-20 ENCOUNTER — Ambulatory Visit (INDEPENDENT_AMBULATORY_CARE_PROVIDER_SITE_OTHER): Admitting: Family Medicine

## 2024-05-20 ENCOUNTER — Encounter: Payer: Self-pay | Admitting: Family Medicine

## 2024-05-20 VITALS — BP 100/60 | HR 73 | Temp 98.0°F | Ht 61.5 in | Wt 118.4 lb

## 2024-05-20 DIAGNOSIS — E785 Hyperlipidemia, unspecified: Secondary | ICD-10-CM | POA: Diagnosis not present

## 2024-05-20 DIAGNOSIS — E559 Vitamin D deficiency, unspecified: Secondary | ICD-10-CM | POA: Diagnosis not present

## 2024-05-20 DIAGNOSIS — Z0000 Encounter for general adult medical examination without abnormal findings: Secondary | ICD-10-CM

## 2024-05-20 DIAGNOSIS — G43711 Chronic migraine without aura, intractable, with status migrainosus: Secondary | ICD-10-CM | POA: Diagnosis not present

## 2024-05-20 MED ORDER — KETOROLAC TROMETHAMINE 60 MG/2ML IM SOLN
60.0000 mg | Freq: Once | INTRAMUSCULAR | Status: AC
  Administered 2024-05-20: 60 mg via INTRAMUSCULAR

## 2024-05-20 NOTE — Addendum Note (Signed)
 Addended by: Philbert Brave on: 05/20/2024 09:46 AM   Modules accepted: Orders

## 2024-05-20 NOTE — Progress Notes (Signed)
 Subjective:    Patient ID: [name], female    DOB: 01-20-1966, 58 y.o.   MRN: [id number]  HPI Here for a well exam. She has been doing well in general. She does says she has had a migraine for the past 3 days, which is unusual for her.  She still sees her psychiatrist and neurologist regularly.    Review of Systems  Constitutional: Negative.   HENT: Negative.    Eyes: Negative.   Respiratory: Negative.    Cardiovascular: Negative.   Gastrointestinal: Negative.   Genitourinary:  Negative for decreased urine volume, difficulty urinating, dyspareunia, dysuria, enuresis, flank pain, frequency, hematuria, pelvic pain and urgency.  Musculoskeletal: Negative.   Skin: Negative.   Neurological:  Positive for headaches.  Psychiatric/Behavioral: Negative.         Objective:   Physical Exam Constitutional:      General: She is not in acute distress.    Appearance: Normal appearance. She is well-developed.  HENT:     Head: Normocephalic and atraumatic.     Right Ear: External ear normal.     Left Ear: External ear normal.     Nose: Nose normal.     Mouth/Throat:     Pharynx: No oropharyngeal exudate.   Eyes:     General: No scleral icterus.    Conjunctiva/sclera: Conjunctivae normal.     Pupils: Pupils are equal, round, and reactive to light.   Neck:     Thyroid : No thyromegaly.     Vascular: No JVD.   Cardiovascular:     Rate and Rhythm: Normal rate and regular rhythm.     Pulses: Normal pulses.     Heart sounds: Normal heart sounds. No murmur heard.    No friction rub. No gallop.  Pulmonary:     Effort: Pulmonary effort is normal. No respiratory distress.     Breath sounds: Normal breath sounds. No wheezing or rales.  Chest:     Chest wall: No tenderness.  Abdominal:     General: Bowel sounds are normal. There is no distension.     Palpations: Abdomen is soft. There is no mass.     Tenderness: There is no abdominal tenderness. There is no guarding or  rebound.   Musculoskeletal:        General: No tenderness. Normal range of motion.     Cervical back: Normal range of motion and neck supple.  Lymphadenopathy:     Cervical: No cervical adenopathy.   Skin:    General: Skin is warm and dry.     Findings: No erythema or rash.   Neurological:     General: No focal deficit present.     Mental Status: She is alert and oriented to person, place, and time.     Cranial Nerves: No cranial nerve deficit.     Motor: No abnormal muscle tone.     Coordination: Coordination normal.     Deep Tendon Reflexes: Reflexes are normal and symmetric. Reflexes normal.   Psychiatric:        Mood and Affect: Mood normal.        Behavior: Behavior normal.        Thought Content: Thought content normal.        Judgment: Judgment normal.           Assessment & Plan:  Well exam. We discussed diet and exercise. Get fasting labs. She is given a shot of Toradol today for the hadache. Corita Diego, MD

## 2024-05-21 ENCOUNTER — Ambulatory Visit: Payer: Self-pay | Admitting: Family Medicine

## 2024-05-21 LAB — BASIC METABOLIC PANEL WITH GFR
BUN/Creatinine Ratio: 13 (calc) (ref 6–22)
BUN: 17 mg/dL (ref 7–25)
CO2: 27 mmol/L (ref 20–32)
Calcium: 8.8 mg/dL (ref 8.6–10.4)
Chloride: 106 mmol/L (ref 98–110)
Creat: 1.28 mg/dL — ABNORMAL HIGH (ref 0.50–1.03)
Glucose, Bld: 77 mg/dL (ref 65–99)
Potassium: 4.4 mmol/L (ref 3.5–5.3)
Sodium: 140 mmol/L (ref 135–146)
eGFR: 49 mL/min/{1.73_m2} — ABNORMAL LOW (ref >=60)

## 2024-05-21 LAB — CBC WITH DIFFERENTIAL/PLATELET
Absolute Lymphocytes: 1444 {cells}/uL (ref 850–3900)
Absolute Monocytes: 469 {cells}/uL (ref 200–950)
Basophils Absolute: 41 {cells}/uL (ref 0–200)
Basophils Relative: 0.9 %
Eosinophils Absolute: 143 {cells}/uL (ref 15–500)
Eosinophils Relative: 3.1 %
HCT: 38.2 % (ref 35.0–45.0)
Hemoglobin: 12.1 g/dL (ref 11.7–15.5)
MCH: 32.7 pg (ref 27.0–33.0)
MCHC: 31.7 g/dL — ABNORMAL LOW (ref 32.0–36.0)
MCV: 103.2 fL — ABNORMAL HIGH (ref 80.0–100.0)
MPV: 9.5 fL (ref 7.5–12.5)
Monocytes Relative: 10.2 %
Neutro Abs: 2502 {cells}/uL (ref 1500–7800)
Neutrophils Relative %: 54.4 %
Platelets: 224 10*3/uL (ref 140–400)
RBC: 3.7 10*6/uL — ABNORMAL LOW (ref 3.80–5.10)
RDW: 14.9 % (ref 11.0–15.0)
Total Lymphocyte: 31.4 %
WBC: 4.6 10*3/uL (ref 3.8–10.8)

## 2024-05-21 LAB — HEPATIC FUNCTION PANEL
AG Ratio: 1.3 (calc) (ref 1.0–2.5)
ALT: 9 U/L (ref 6–29)
AST: 15 U/L (ref 10–35)
Albumin: 3.8 g/dL (ref 3.6–5.1)
Alkaline phosphatase (APISO): 60 U/L (ref 37–153)
Bilirubin, Direct: 0 mg/dL (ref 0.0–0.2)
Globulin: 3 g/dL (ref 1.9–3.7)
Indirect Bilirubin: 0.2 mg/dL (ref 0.2–1.2)
Total Bilirubin: 0.2 mg/dL (ref 0.2–1.2)
Total Protein: 6.8 g/dL (ref 6.1–8.1)

## 2024-05-21 LAB — LIPID PANEL
Cholesterol: 209 mg/dL — ABNORMAL HIGH (ref <200)
HDL: 65 mg/dL (ref >=50)
LDL Cholesterol (Calc): 115 mg/dL — ABNORMAL HIGH
Non-HDL Cholesterol (Calc): 144 mg/dL — ABNORMAL HIGH (ref <130)
Total CHOL/HDL Ratio: 3.2 (calc) (ref <5.0)
Triglycerides: 171 mg/dL — ABNORMAL HIGH (ref <150)

## 2024-05-21 LAB — TSH: TSH: 1.45 m[IU]/L (ref 0.40–4.50)

## 2024-05-21 LAB — VITAMIN D 25 HYDROXY (VIT D DEFICIENCY, FRACTURES): Vit D, 25-Hydroxy: 35 ng/mL (ref 30–100)

## 2024-05-30 DIAGNOSIS — F250 Schizoaffective disorder, bipolar type: Secondary | ICD-10-CM | POA: Diagnosis not present

## 2024-06-17 ENCOUNTER — Telehealth: Admitting: Family Medicine

## 2024-06-17 ENCOUNTER — Encounter: Payer: Self-pay | Admitting: Family Medicine

## 2024-06-17 DIAGNOSIS — G43711 Chronic migraine without aura, intractable, with status migrainosus: Secondary | ICD-10-CM | POA: Diagnosis not present

## 2024-06-17 DIAGNOSIS — G8929 Other chronic pain: Secondary | ICD-10-CM

## 2024-06-17 MED ORDER — HYDROCODONE-ACETAMINOPHEN 5-325 MG PO TABS: 1.0000 | ORAL_TABLET | Freq: Four times a day (QID) | ORAL | 0 refills | Status: DC | PRN

## 2024-06-17 NOTE — Progress Notes (Signed)
 Subjective:    Patient ID: [name], female    DOB: 08/05/1966, 58 y.o.   MRN: [id number]  HPI Virtual Visit via Video Note  I connected with the patient on 06/17/24 at  3:45 PM EDT by a video enabled telemedicine application and verified that I am speaking with the correct person using two identifiers.  Location patient: home Location provider:work or home office Persons participating in the virtual visit: patient, provider  I discussed the limitations of evaluation and management by telemedicine and the availability of in person appointments. The patient expressed understanding and agreed to proceed.   HPI: Here for pain management. She is doing well.    ROS: See pertinent positives and negatives per HPI.  Past Medical History:  Diagnosis Date   Anxiety    Arthritis    Chronic constipation 01/10/2014   Depression    sees Darice Molt NP    Drug overdose 10/18/2014   Epilepsy (HCC)    seizures, sees Dr. Jenel   Gallstones    Yzjijryz(215.9)    migraine   Headache, common migraine, intractable 09/09/2015   Hemorrhoids    Hypertension    Insomnia    Obsessive compulsive disorder    Suicidal ideation    Suicide and self-inflicted injury (HCC) 10/18/2014    Past Surgical History:  Procedure Laterality Date   ABDOMINAL HYSTERECTOMY  2001   APPENDECTOMY Right    with ovarian cyst removal   BREAST EXCISIONAL BIOPSY     left 2008   CESAREAN SECTION     CHOLECYSTECTOMY     COLONOSCOPY  10/07/2019   per Dr. Avram, no polyps, repeat in 10 yrs    HEMORRHOID BANDING  2015   TONSILLECTOMY      Family History  Problem Relation Age of Onset   Cirrhosis Mother    Stomach cancer Mother    Heart disease Mother    Heart disease Father    Hypertension Father    Thyroid  cancer Brother    HIV Brother    Pancreatic cancer Maternal Uncle    Colon cancer Neg Hx    Esophageal cancer Neg Hx    Rectal cancer Neg Hx      Current Outpatient Medications:     estrogens , conjugated, (PREMARIN ) 0.625 MG tablet, TAKE 1 TABLET BY MOUTH EVERY DAY FOR 21 DAYS. DO NOT TAKE FOR 7 DAYS, Disp: 90 tablet, Rfl: 3   HYDROcodone -acetaminophen  (NORCO) 5-325 MG tablet, Take 1 tablet by mouth every 6 (six) hours as needed for moderate pain (pain score 4-6)., Disp: 120 tablet, Rfl: 0   HYDROcodone -acetaminophen  (NORCO) 5-325 MG tablet, Take 1 tablet by mouth every 6 (six) hours as needed for moderate pain (pain score 4-6)., Disp: 120 tablet, Rfl: 0   HYDROcodone -acetaminophen  (NORCO) 5-325 MG tablet, Take 1 tablet by mouth every 6 (six) hours as needed for moderate pain (pain score 4-6)., Disp: 120 tablet, Rfl: 0   lurasidone (LATUDA) 80 MG TABS tablet, Take 80 mg by mouth at bedtime., Disp: , Rfl:    methocarbamol  (ROBAXIN ) 500 MG tablet, Take 500 mg by mouth daily., Disp: , Rfl:    ondansetron  (ZOFRAN -ODT) 8 MG disintegrating tablet, Take 1 tablet (8 mg total) by mouth every 8 (eight) hours as needed for nausea or vomiting., Disp: 20 tablet, Rfl: 11   pregabalin  (LYRICA ) 150 MG capsule, Take 1 capsule (150 mg total) by mouth 2 (two) times daily. TAKE 1 CAPSULE(150 MG) BY MOUTH TWICE DAILY, Disp: 180 capsule, Rfl:  1   trazodone  (DESYREL ) 300 MG tablet, Take 1 tablet (300 mg total) by mouth at bedtime., Disp: 90 tablet, Rfl: 3   zolpidem  (AMBIEN ) 10 MG tablet, Take 1 tablet (10 mg total) by mouth at bedtime., Disp: 30 tablet, Rfl: 5   zonisamide  (ZONEGRAN ) 100 MG capsule, Take 2 capsules (200 mg total) by mouth every morning AND 3 capsules (300 mg total) at bedtime., Disp: 450 capsule, Rfl: 3  EXAM:  VITALS per patient if applicable:  GENERAL: alert, oriented, appears well and in no acute distress  HEENT: atraumatic, conjunttiva clear, no obvious abnormalities on inspection of external nose and ears  NECK: normal movements of the head and neck  LUNGS: on inspection no signs of respiratory distress, breathing rate appears normal, no obvious gross SOB, gasping or  wheezing  CV: no obvious cyanosis  MS: moves all visible extremities without noticeable abnormality  PSYCH/NEURO: pleasant and cooperative, no obvious depression or anxiety, speech and thought processing grossly intact  ASSESSMENT AND PLAN: Pain management.  Indication for chronic opioid: headaches Medication and dose: Norco 5-325 # pills per month: 120 Last UDS date: 03-19-24 Opioid Treatment Agreement signed (Y/N): 02-10-22 Opioid Treatment Agreement last reviewed with patient:  06-17-24 NCCSRS reviewed this encounter (include red flags): Yes Meds were refilled.  Garnette Olmsted, MD  Discussed the following assessment and plan:  No diagnosis found.     I discussed the assessment and treatment plan with the patient. The patient was provided an opportunity to ask questions and all were answered. The patient agreed with the plan and demonstrated an understanding of the instructions.   The patient was advised to call back or seek an in-person evaluation if the symptoms worsen or if the condition fails to improve as anticipated.      Review of Systems     Objective:   Physical Exam        Assessment & Plan:

## 2024-08-13 ENCOUNTER — Telehealth: Admitting: Adult Health

## 2024-08-13 DIAGNOSIS — F250 Schizoaffective disorder, bipolar type: Secondary | ICD-10-CM | POA: Diagnosis not present

## 2024-08-16 ENCOUNTER — Encounter: Payer: Self-pay | Admitting: Family Medicine

## 2024-08-16 ENCOUNTER — Telehealth: Admitting: Family Medicine

## 2024-08-16 DIAGNOSIS — G8929 Other chronic pain: Secondary | ICD-10-CM | POA: Diagnosis not present

## 2024-08-16 DIAGNOSIS — G43711 Chronic migraine without aura, intractable, with status migrainosus: Secondary | ICD-10-CM

## 2024-08-16 MED ORDER — HYDROCODONE-ACETAMINOPHEN 5-325 MG PO TABS: 1.0000 | ORAL_TABLET | Freq: Four times a day (QID) | ORAL | 0 refills | Status: DC | PRN

## 2024-08-16 MED ORDER — ZOLPIDEM TARTRATE 10 MG PO TABS: 10.0000 mg | ORAL_TABLET | Freq: Every day | ORAL | 5 refills | Status: AC

## 2024-08-16 NOTE — Progress Notes (Signed)
 Subjective:    Patient ID: [name], female    DOB: January 09, 1966, 58 y.o.   MRN: [id number]  HPI Virtual Visit via Video Note  I connected with the patient on 08/16/24 at  2:00 PM EDT by a video enabled telemedicine application and verified that I am speaking with the correct person using two identifiers.  Location patient: home Location provider:work or home office Persons participating in the virtual visit: patient, provider  I discussed the limitations of evaluation and management by telemedicine and the availability of in person appointments. The patient expressed understanding and agreed to proceed.   HPI: Here for pain management. She is doing well.    ROS: See pertinent positives and negatives per HPI.  Past Medical History:  Diagnosis Date   Anxiety    Arthritis    Chronic constipation 01/10/2014   Depression    sees Darice Molt NP    Drug overdose 10/18/2014   Epilepsy (HCC)    seizures, sees Dr. Jenel   Gallstones    Yzjijryz(215.9)    migraine   Headache, common migraine, intractable 09/09/2015   Hemorrhoids    Hypertension    Insomnia    Obsessive compulsive disorder    Suicidal ideation    Suicide and self-inflicted injury (HCC) 10/18/2014    Past Surgical History:  Procedure Laterality Date   ABDOMINAL HYSTERECTOMY  2001   APPENDECTOMY Right    with ovarian cyst removal   BREAST EXCISIONAL BIOPSY     left 2008   CESAREAN SECTION     CHOLECYSTECTOMY     COLONOSCOPY  10/07/2019   per Dr. Avram, no polyps, repeat in 10 yrs    HEMORRHOID BANDING  2015   TONSILLECTOMY      Family History  Problem Relation Age of Onset   Cirrhosis Mother    Stomach cancer Mother    Heart disease Mother    Heart disease Father    Hypertension Father    Thyroid  cancer Brother    HIV Brother    Pancreatic cancer Maternal Uncle    Colon cancer Neg Hx    Esophageal cancer Neg Hx    Rectal cancer Neg Hx      Current Outpatient Medications:     estrogens , conjugated, (PREMARIN ) 0.625 MG tablet, TAKE 1 TABLET BY MOUTH EVERY DAY FOR 21 DAYS. DO NOT TAKE FOR 7 DAYS, Disp: 90 tablet, Rfl: 3   HYDROcodone -acetaminophen  (NORCO) 5-325 MG tablet, Take 1 tablet by mouth every 6 (six) hours as needed for moderate pain (pain score 4-6)., Disp: 120 tablet, Rfl: 0   HYDROcodone -acetaminophen  (NORCO) 5-325 MG tablet, Take 1 tablet by mouth every 6 (six) hours as needed for moderate pain (pain score 4-6)., Disp: 120 tablet, Rfl: 0   HYDROcodone -acetaminophen  (NORCO) 5-325 MG tablet, Take 1 tablet by mouth every 6 (six) hours as needed for moderate pain (pain score 4-6)., Disp: 120 tablet, Rfl: 0   lurasidone (LATUDA) 80 MG TABS tablet, Take 80 mg by mouth at bedtime., Disp: , Rfl:    methocarbamol  (ROBAXIN ) 500 MG tablet, Take 500 mg by mouth daily., Disp: , Rfl:    ondansetron  (ZOFRAN -ODT) 8 MG disintegrating tablet, Take 1 tablet (8 mg total) by mouth every 8 (eight) hours as needed for nausea or vomiting., Disp: 20 tablet, Rfl: 11   pregabalin  (LYRICA ) 150 MG capsule, Take 1 capsule (150 mg total) by mouth 2 (two) times daily. TAKE 1 CAPSULE(150 MG) BY MOUTH TWICE DAILY, Disp: 180 capsule, Rfl:  1   trazodone  (DESYREL ) 300 MG tablet, Take 1 tablet (300 mg total) by mouth at bedtime., Disp: 90 tablet, Rfl: 3   zolpidem  (AMBIEN ) 10 MG tablet, Take 1 tablet (10 mg total) by mouth at bedtime., Disp: 30 tablet, Rfl: 5   zonisamide  (ZONEGRAN ) 100 MG capsule, Take 2 capsules (200 mg total) by mouth every morning AND 3 capsules (300 mg total) at bedtime., Disp: 450 capsule, Rfl: 3  EXAM:  VITALS per patient if applicable:  GENERAL: alert, oriented, appears well and in no acute distress  HEENT: atraumatic, conjunttiva clear, no obvious abnormalities on inspection of external nose and ears  NECK: normal movements of the head and neck  LUNGS: on inspection no signs of respiratory distress, breathing rate appears normal, no obvious gross SOB, gasping or  wheezing  CV: no obvious cyanosis  MS: moves all visible extremities without noticeable abnormality  PSYCH/NEURO: pleasant and cooperative, no obvious depression or anxiety, speech and thought processing grossly intact  ASSESSMENT AND PLAN: Pain management. Indication for chronic opioid: headaches Medication and dose: Norco 5-325 # pills per month: 120 Last UDS date: 03-19-24 Opioid Treatment Agreement signed (Y/N): 02-10-22 Opioid Treatment Agreement last reviewed with patient:  08-16-24 NCCSRS reviewed this encounter (include red flags): Yes Meds were refilled.  Garnette Olmsted, MD  Discussed the following assessment and plan:  No diagnosis found.     I discussed the assessment and treatment plan with the patient. The patient was provided an opportunity to ask questions and all were answered. The patient agreed with the plan and demonstrated an understanding of the instructions.   The patient was advised to call back or seek an in-person evaluation if the symptoms worsen or if the condition fails to improve as anticipated.      Review of Systems     Objective:   Physical Exam        Assessment & Plan:

## 2024-08-27 NOTE — Progress Notes (Unsigned)
 [name] [address] [name]. [name] [address] [phone]       OFFICE FOLLOW UP NOTE  [name] Date of Birth:  1965-12-19 Medical Record Number:  [id number]   Current neurologist: Dr. Rush Reason for visit: Migraine headaches  Virtual Visit via Video Note  I connected with Newell GORMAN Boxer on 08/27/24 at  7:45 AM EDT by a video enabled telemedicine application and verified that I am speaking with the correct person using two identifiers.  Location: Patient: at home Provider: in office   I discussed the limitations of evaluation and management by telemedicine and the availability of in person appointments. The patient expressed understanding and agreed to proceed.   SUBJECTIVE:   [name]  Prior visit: 02/05/2024  Brief HPI:   [name] is a 58 y.o. female with longstanding history of intractable migraine headaches and seizure disorder.  Started Botox  in 2016, last injection 02/2022, stopped due to cost.  Last seizure 2019 on zonisamide  and pregabalin .   At prior visit, reported about 20 migraines per month with noted worsening after lowering zonisamide  dosage which she has been on long-term for seizure prevention.  Zonisamide  dosage increased back to 200/300 and also remained on pregabalin .    Interval history:  Returns for 37-month follow-up via MyChart video visit.   Reports overall stable.  Has been experiencing about 20 migraines per month, continues to use hydrocodone  and Zofran  as needed with benefit. She does feel migraines have worsened some since decreasing zonisamide  dosage.  Denies any seizure activity or side effects on zonisamide  and pregabalin .  She is interested in returning back to prior dose of zonisamide  due to increase in migraines.     ROS:   14 system review of systems performed and negative with exception of those listed in HPI  PMH:  Past Medical History:  Diagnosis Date   Anxiety     Arthritis    Chronic constipation 01/10/2014   Depression    sees Darice Molt NP    Drug overdose 10/18/2014   Epilepsy (HCC)    seizures, sees Dr. Jenel   Gallstones    Yzjijryz(215.9)    migraine   Headache, common migraine, intractable 09/09/2015   Hemorrhoids    Hypertension    Insomnia    Obsessive compulsive disorder    Suicidal ideation    Suicide and self-inflicted injury (HCC) 10/18/2014    PSH:  Past Surgical History:  Procedure Laterality Date   ABDOMINAL HYSTERECTOMY  2001   APPENDECTOMY Right    with ovarian cyst removal   BREAST EXCISIONAL BIOPSY     left 2008   CESAREAN SECTION     CHOLECYSTECTOMY     COLONOSCOPY  10/07/2019   per Dr. Avram, no polyps, repeat in 10 yrs    HEMORRHOID BANDING  2015   TONSILLECTOMY      Social History:  Social History   Socioeconomic History   Marital status: Married    Spouse name: Not on file   Number of children: 2   Years of education: 13   Highest education level: Not on file  Occupational History   Occupation: homemaker    Employer: UNEMPLOYED  Tobacco Use   Smoking status: Never   Smokeless tobacco: Never  Vaping Use   Vaping status: Never Used  Substance and Sexual Activity   Alcohol use: Yes    Alcohol/week: 0.0 standard drinks of alcohol    Comment: rare   Drug use: No  Sexual activity: Not on file  Other Topics Concern   Not on file  Social History Narrative   Married 2 sons homemaker   4-5 caffeinated beverages daily ([employer]epsi)   Patient is right handed.   Social Drivers of Corporate investment banker Strain: Not on file  Food Insecurity: Not on file  Transportation Needs: Not on file  Physical Activity: Not on file  Stress: Not on file  Social Connections: Not on file  Intimate Partner Violence: Not on file    Family History:  Family History  Problem Relation Age of Onset   Cirrhosis Mother    Stomach cancer Mother    Heart disease Mother    Heart disease Father    Hypertension  Father    Thyroid  cancer Brother    HIV Brother    Pancreatic cancer Maternal Uncle    Colon cancer Neg Hx    Esophageal cancer Neg Hx    Rectal cancer Neg Hx     Medications:   [name]  Medication Sig Dispense Refill   estrogens , conjugated, (PREMARIN ) 0.625 MG tablet TAKE 1 TABLET BY MOUTH EVERY DAY FOR 21 DAYS. DO NOT TAKE FOR 7 DAYS 90 tablet 3   HYDROcodone -acetaminophen  (NORCO) 5-325 MG tablet Take 1 tablet by mouth every 6 (six) hours as needed for moderate pain (pain score 4-6). 120 tablet 0   HYDROcodone -acetaminophen  (NORCO) 5-325 MG tablet Take 1 tablet by mouth every 6 (six) hours as needed for moderate pain (pain score 4-6). 120 tablet 0   HYDROcodone -acetaminophen  (NORCO) 5-325 MG tablet Take 1 tablet by mouth every 6 (six) hours as needed for moderate pain (pain score 4-6). 120 tablet 0   HYDROcodone -acetaminophen  (NORCO) 5-325 MG tablet Take 1 tablet by mouth every 6 (six) hours as needed for moderate pain (pain score 4-6). 120 tablet 0   lurasidone (LATUDA) 80 MG TABS tablet Take 80 mg by mouth at bedtime.     methocarbamol  (ROBAXIN ) 500 MG tablet Take 500 mg by mouth daily.     ondansetron  (ZOFRAN -ODT) 8 MG disintegrating tablet Take 1 tablet (8 mg total) by mouth every 8 (eight) hours as needed for nausea or vomiting. 20 tablet 11   pregabalin  (LYRICA ) 150 MG capsule Take 1 capsule (150 mg total) by mouth 2 (two) times daily. TAKE 1 CAPSULE(150 MG) BY MOUTH TWICE DAILY 180 capsule 1   trazodone  (DESYREL ) 300 MG tablet Take 1 tablet (300 mg total) by mouth at bedtime. 90 tablet 3   zolpidem  (AMBIEN ) 10 MG tablet Take 1 tablet (10 mg total) by mouth at bedtime. 30 tablet 5   zonisamide  (ZONEGRAN ) 100 MG capsule Take 2 capsules (200 mg total) by mouth every morning AND 3 capsules (300 mg total) at bedtime. 450 capsule 3   No current facility-administered medications on file prior to visit.    Allergies:   Allergies  Allergen  Reactions   Vimpat [Lacosamide]     Increased depression      OBJECTIVE: N/A d/t visit type      ASSESSMENT/PLAN: [name] is a 58 y.o. year old female   1.  Chronic migraine headaches  -Currently about 20 migraines per month lasting 1 to 2 days, d/c'd botox  in 2023 due to cost.   -reports worsening of migraines after adjusting zonisamide  dosage at prior visit (see below)  -Declines interest in further preventative therapy  -Previously tried/failed: Aimovig , Botox , zonisamide , pregabalin , metoprolol , hydrocodone , Relpax , lamotrigine , levetiracetam ,  Robaxin , Remeron  -Prefers to continue hydrocodone  PRN (managed by PCP) and zofran  as needed  -Advised to call if interested in pursuing prophylactic therapy in the future    2.  Seizure disorder  -Present since teenager  -Last seizure activity 2019  -Previously, patient interested in gradually coming off ASM therapy but does have a history of recurrent seizure off ASM therefore it is recommended she continue with ASM lifelong.  Zonisamide  dosage was decreased from 200/300 to 200mg  twice daily and remain on pregabalin , no seizure activity but noted increased migraines with reduced dosage.  -Increase zonisamide  back to 200/300  -Continue pregabalin  150mg  BID - refill not yet due  -Prior intolerance to Vimpat  -Advised to call with any seizure activity or worsening migraine headaches as zonisamide  can help with migraine management     Follow up in 6 months or call earlier if needed     CC:  PCP: Johnny Garnette LABOR, MD    I personally spent a total of *** minutes in the care of the patient today including {Time Based Coding:210964241}.   Harlene Bogaert, AGNP-BC  [facility] [address] [place], [place] [address]  Phone [phone] Fax [phone] Note: This document was prepared with digital dictation and possible smart phrase technology. Any transcriptional errors that result from  this process are unintentional.

## 2024-08-28 ENCOUNTER — Telehealth (INDEPENDENT_AMBULATORY_CARE_PROVIDER_SITE_OTHER): Admitting: Adult Health

## 2024-08-28 ENCOUNTER — Encounter: Payer: Self-pay | Admitting: Adult Health

## 2024-08-28 DIAGNOSIS — G40909 Epilepsy, unspecified, not intractable, without status epilepticus: Secondary | ICD-10-CM | POA: Diagnosis not present

## 2024-08-28 DIAGNOSIS — R569 Unspecified convulsions: Secondary | ICD-10-CM

## 2024-08-28 DIAGNOSIS — G43709 Chronic migraine without aura, not intractable, without status migrainosus: Secondary | ICD-10-CM | POA: Diagnosis not present

## 2024-08-28 MED ORDER — AJOVY 225 MG/1.5ML ~~LOC~~ SOAJ: 225.0000 mg | SUBCUTANEOUS | 11 refills | Status: AC

## 2024-09-12 ENCOUNTER — Telehealth: Payer: Self-pay | Admitting: Family Medicine

## 2024-09-12 ENCOUNTER — Other Ambulatory Visit: Payer: Self-pay | Admitting: Family Medicine

## 2024-09-12 NOTE — Telephone Encounter (Unsigned)
 Copied from CRM [id number]. Topic: Clinical - Medication Refill >> Sep 12, 2024 12:35 PM Alfonso ORN wrote: Medication: zolpidem  (AMBIEN ) 10 MG tablet  Has the patient contacted their pharmacy? Yes (Agent: If no, request that the patient contact the pharmacy for the refill. If patient does not wish to contact the pharmacy document the reason why and proceed with request.) (Agent: If yes, when and what did the pharmacy advise?)  This is the patient's preferred pharmacy:  [id number] [place], [place] - [address] AT [facility] [address] [place] [address] Phone: [phone] Fax: [phone]   Is this the correct pharmacy for this prescription? Yes   Has the prescription been filled recently? no  Is the patient out of the medication? Yes  Has the patient been seen for an appointment in the last year OR does the patient have an upcoming appointment? Yes  Can we respond through MyChart? Yes

## 2024-09-14 ENCOUNTER — Encounter: Payer: Self-pay | Admitting: Adult Health

## 2024-09-16 ENCOUNTER — Other Ambulatory Visit (HOSPITAL_COMMUNITY): Payer: Self-pay

## 2024-09-16 ENCOUNTER — Telehealth: Payer: Self-pay

## 2024-09-16 ENCOUNTER — Other Ambulatory Visit: Payer: Self-pay

## 2024-09-16 NOTE — Telephone Encounter (Signed)
 Pharmacy Patient Advocate Encounter   Received notification from RX Request Messages that prior authorization for Ajovy  225mg /1.58ml autoinjector is required/requested.   Insurance verification completed.   The patient is insured through [facility].   Per test claim: PA required; PA submitted to above mentioned insurance via Latent Key/confirmation #/EOC [id number] Status is pending

## 2024-09-16 NOTE — Telephone Encounter (Signed)
 Last seen 08-28-2024, next appt 03-12-2025.  Last fill 06-11-2024 #[id number].

## 2024-09-16 NOTE — Telephone Encounter (Signed)
 Last filled by patient on 06/11/24 Last office visit : 08/28/24 Next office visit :  03/12/25 Continue pregabalin  150 mg BID  - This order already has been pended by another nurse.

## 2024-09-17 MED ORDER — PREGABALIN 150 MG PO CAPS: 150.0000 mg | ORAL_CAPSULE | Freq: Two times a day (BID) | ORAL | 1 refills | Status: AC

## 2024-09-17 NOTE — Telephone Encounter (Signed)
 Pharmacy Patient Advocate Encounter  Received notification from [facility] that Prior Authorization for Ajovy  has been APPROVED from 09/17/2024 to 12/09/2024   PA #/Case ID/Reference #: [id number]

## 2024-10-28 DIAGNOSIS — H04123 Dry eye syndrome of bilateral lacrimal glands: Secondary | ICD-10-CM | POA: Diagnosis not present

## 2024-10-28 DIAGNOSIS — H04211 Epiphora due to excess lacrimation, right lacrimal gland: Secondary | ICD-10-CM | POA: Diagnosis not present

## 2024-11-06 DIAGNOSIS — F250 Schizoaffective disorder, bipolar type: Secondary | ICD-10-CM | POA: Diagnosis not present

## 2024-12-17 ENCOUNTER — Encounter: Payer: Self-pay | Admitting: Family Medicine

## 2024-12-17 ENCOUNTER — Telehealth: Admitting: Family Medicine

## 2024-12-17 DIAGNOSIS — G43019 Migraine without aura, intractable, without status migrainosus: Secondary | ICD-10-CM

## 2024-12-17 DIAGNOSIS — G8929 Other chronic pain: Secondary | ICD-10-CM | POA: Diagnosis not present

## 2024-12-17 MED ORDER — HYDROCODONE-ACETAMINOPHEN 5-325 MG PO TABS: 1.0000 | ORAL_TABLET | Freq: Four times a day (QID) | ORAL | 0 refills | Status: AC | PRN

## 2024-12-17 NOTE — Progress Notes (Signed)
 "  Subjective:    Patient ID: [name], female    DOB: 1966-06-10, 59 y.o.   MRN: [id number]  HPI Virtual Visit via Video Note  I connected with the patient on 12/17/2024 at  3:45 PM EST by a video enabled telemedicine application and verified that I am speaking with the correct person using two identifiers.  Location patient: home Location provider:work or home office Persons participating in the virtual visit: patient, provider  I discussed the limitations of evaluation and management by telemedicine and the availability of in person appointments. The patient expressed understanding and agreed to proceed.   HPI: Here for pain management. She is doing well.    ROS: See pertinent positives and negatives per HPI.  Past Medical History:  Diagnosis Date   Anxiety    Arthritis    Chronic constipation 01/10/2014   Depression    sees Darice Molt NP    Drug overdose 10/18/2014   Epilepsy (HCC)    seizures, sees Dr. Jenel   Gallstones    Yzjijryz(215.9)    migraine   Headache, common migraine, intractable 09/09/2015   Hemorrhoids    Hypertension    Insomnia    Obsessive compulsive disorder    Suicidal ideation    Suicide and self-inflicted injury (HCC) 10/18/2014    Past Surgical History:  Procedure Laterality Date   ABDOMINAL HYSTERECTOMY  2001   APPENDECTOMY Right    with ovarian cyst removal   BREAST EXCISIONAL BIOPSY     left 2008   CESAREAN SECTION     CHOLECYSTECTOMY     COLONOSCOPY  10/07/2019   per Dr. Avram, no polyps, repeat in 10 yrs    HEMORRHOID BANDING  2015   TONSILLECTOMY      Family History  Problem Relation Age of Onset   Cirrhosis Mother    Stomach cancer Mother    Heart disease Mother    Heart disease Father    Hypertension Father    Thyroid  cancer Brother    HIV Brother    Pancreatic cancer Maternal Uncle    Colon cancer Neg Hx    Esophageal cancer Neg Hx    Rectal cancer Neg Hx     Current Medications[1]  EXAM:  VITALS  per patient if applicable:  GENERAL: alert, oriented, appears well and in no acute distress  HEENT: atraumatic, conjunttiva clear, no obvious abnormalities on inspection of external nose and ears  NECK: normal movements of the head and neck  LUNGS: on inspection no signs of respiratory distress, breathing rate appears normal, no obvious gross SOB, gasping or wheezing  CV: no obvious cyanosis  MS: moves all visible extremities without noticeable abnormality  PSYCH/NEURO: pleasant and cooperative, no obvious depression or anxiety, speech and thought processing grossly intact  ASSESSMENT AND PLAN: Pain management.  Indication for chronic opioid: headaches Medication and dose: Norco 5-325 # pills per month: 120 Last UDS date: 03-19-24 Opioid Treatment Agreement signed (Y/N): 02-10-22 Opioid Treatment Agreement last reviewed with patient:  12-17-24 [facility] reviewed this encounter (include red flags): Yes Meds were refilled.  Garnette Olmsted, MD  Discussed the following assessment and plan:  No diagnosis found.     I discussed the assessment and treatment plan with the patient. The patient was provided an opportunity to ask questions and all were answered. The patient agreed with the plan and demonstrated an understanding of the instructions.   The patient was advised to call back or seek an in-person evaluation if the  symptoms worsen or if the condition fails to improve as anticipated.      Review of Systems     Objective:   Physical Exam        Assessment & Plan:       [1]  Current Outpatient Medications:    estrogens , conjugated, (PREMARIN ) 0.625 MG tablet, TAKE 1 TABLET BY MOUTH EVERY DAY FOR 21 DAYS. DO NOT TAKE FOR 7 DAYS, Disp: 90 tablet, Rfl: 3   Fremanezumab -vfrm (AJOVY ) 225 MG/1.5ML SOAJ, Inject 225 mg into the skin every 30 (thirty) days., Disp: 1.68 mL, Rfl: 11   HYDROcodone -acetaminophen  (NORCO) 5-325 MG tablet, Take 1 tablet by mouth every 6 (six) hours as  needed for moderate pain (pain score 4-6)., Disp: 120 tablet, Rfl: 0   HYDROcodone -acetaminophen  (NORCO) 5-325 MG tablet, Take 1 tablet by mouth every 6 (six) hours as needed for moderate pain (pain score 4-6)., Disp: 120 tablet, Rfl: 0   HYDROcodone -acetaminophen  (NORCO) 5-325 MG tablet, Take 1 tablet by mouth every 6 (six) hours as needed for moderate pain (pain score 4-6)., Disp: 120 tablet, Rfl: 0   HYDROcodone -acetaminophen  (NORCO) 5-325 MG tablet, Take 1 tablet by mouth every 6 (six) hours as needed for moderate pain (pain score 4-6)., Disp: 120 tablet, Rfl: 0   lurasidone (LATUDA) 80 MG TABS tablet, Take 80 mg by mouth at bedtime., Disp: , Rfl:    ondansetron  (ZOFRAN -ODT) 8 MG disintegrating tablet, Take 1 tablet (8 mg total) by mouth every 8 (eight) hours as needed for nausea or vomiting., Disp: 20 tablet, Rfl: 11   pregabalin  (LYRICA ) 150 MG capsule, Take 1 capsule (150 mg total) by mouth 2 (two) times daily. TAKE 1 CAPSULE(150 MG) BY MOUTH TWICE DAILY, Disp: 180 capsule, Rfl: 1   trazodone  (DESYREL ) 300 MG tablet, Take 1 tablet (300 mg total) by mouth at bedtime., Disp: 90 tablet, Rfl: 3   zolpidem  (AMBIEN ) 10 MG tablet, Take 1 tablet (10 mg total) by mouth at bedtime., Disp: 30 tablet, Rfl: 5   zonisamide  (ZONEGRAN ) 100 MG capsule, Take 2 capsules (200 mg total) by mouth every morning AND 3 capsules (300 mg total) at bedtime., Disp: 450 capsule, Rfl: 3  "

## 2025-03-12 ENCOUNTER — Telehealth: Admitting: Adult Health

## 2025-03-20 ENCOUNTER — Encounter: Admitting: Family Medicine
